# Patient Record
Sex: Female | Born: 1975
Health system: Southern US, Community
[De-identification: ages and names within clinical notes are randomized; demographics above are authoritative.]

## PROBLEM LIST (undated history)

## (undated) DIAGNOSIS — N979 Female infertility, unspecified: Secondary | ICD-10-CM

## (undated) DIAGNOSIS — N736 Female pelvic peritoneal adhesions (postinfective): Secondary | ICD-10-CM

## (undated) DIAGNOSIS — D649 Anemia, unspecified: Secondary | ICD-10-CM

## (undated) DIAGNOSIS — Z8614 Personal history of Methicillin resistant Staphylococcus aureus infection: Secondary | ICD-10-CM

## (undated) DIAGNOSIS — I10 Essential (primary) hypertension: Secondary | ICD-10-CM

## (undated) DIAGNOSIS — N920 Excessive and frequent menstruation with regular cycle: Secondary | ICD-10-CM

## (undated) DIAGNOSIS — E119 Type 2 diabetes mellitus without complications: Secondary | ICD-10-CM

## (undated) DIAGNOSIS — D259 Leiomyoma of uterus, unspecified: Secondary | ICD-10-CM

## (undated) DIAGNOSIS — K5909 Other constipation: Secondary | ICD-10-CM

## (undated) DIAGNOSIS — T7840XA Allergy, unspecified, initial encounter: Secondary | ICD-10-CM

## (undated) DIAGNOSIS — Z973 Presence of spectacles and contact lenses: Secondary | ICD-10-CM

## (undated) DIAGNOSIS — K5900 Constipation, unspecified: Secondary | ICD-10-CM

## (undated) HISTORY — DX: Anemia, unspecified: D64.9

## (undated) HISTORY — DX: Essential (primary) hypertension: I10

## (undated) HISTORY — DX: Female infertility, unspecified: N97.9

## (undated) HISTORY — PX: ROBOT ASSISTED MYOMECTOMY: SHX5142

## (undated) HISTORY — PX: ADENOIDECTOMY: SUR15

## (undated) HISTORY — PX: MYOMECTOMY: SHX85

## (undated) HISTORY — DX: Constipation, unspecified: K59.00

## (undated) HISTORY — DX: Type 2 diabetes mellitus without complications: E11.9

---

## 2007-11-17 ENCOUNTER — Emergency Department (HOSPITAL_COMMUNITY): Admission: EM | Admit: 2007-11-17 | Discharge: 2007-11-17 | Payer: Self-pay | Admitting: Family Medicine

## 2007-11-19 ENCOUNTER — Emergency Department (HOSPITAL_COMMUNITY): Admission: EM | Admit: 2007-11-19 | Discharge: 2007-11-19 | Payer: Self-pay | Admitting: Family Medicine

## 2007-11-21 ENCOUNTER — Emergency Department (HOSPITAL_COMMUNITY): Admission: EM | Admit: 2007-11-21 | Discharge: 2007-11-21 | Payer: Self-pay | Admitting: Family Medicine

## 2009-03-03 ENCOUNTER — Emergency Department (HOSPITAL_COMMUNITY): Admission: EM | Admit: 2009-03-03 | Discharge: 2009-03-03 | Payer: Self-pay | Admitting: Family Medicine

## 2009-06-22 ENCOUNTER — Inpatient Hospital Stay (HOSPITAL_COMMUNITY): Admission: AD | Admit: 2009-06-22 | Discharge: 2009-06-22 | Payer: Self-pay | Admitting: Obstetrics & Gynecology

## 2010-02-03 ENCOUNTER — Ambulatory Visit (HOSPITAL_COMMUNITY)
Admission: RE | Admit: 2010-02-03 | Discharge: 2010-02-06 | Payer: Self-pay | Source: Home / Self Care | Attending: Obstetrics & Gynecology | Admitting: Obstetrics & Gynecology

## 2010-02-03 ENCOUNTER — Encounter: Payer: Self-pay | Admitting: Obstetrics & Gynecology

## 2010-02-10 ENCOUNTER — Ambulatory Visit (HOSPITAL_COMMUNITY)
Admission: RE | Admit: 2010-02-10 | Discharge: 2010-02-10 | Payer: Self-pay | Source: Home / Self Care | Attending: Obstetrics & Gynecology | Admitting: Obstetrics & Gynecology

## 2010-05-01 LAB — CBC
MCHC: 31.3 g/dL (ref 30.0–36.0)
MCV: 70.6 fL — ABNORMAL LOW (ref 78.0–100.0)
MCV: 70.9 fL — ABNORMAL LOW (ref 78.0–100.0)
Platelets: 232 10*3/uL (ref 150–400)
RBC: 3.4 MIL/uL — ABNORMAL LOW (ref 3.87–5.11)
RDW: 16.2 % — ABNORMAL HIGH (ref 11.5–15.5)
RDW: 16.3 % — ABNORMAL HIGH (ref 11.5–15.5)

## 2010-05-01 LAB — BODY FLUID CULTURE: Culture: NO GROWTH

## 2010-05-01 LAB — TYPE AND SCREEN: Antibody Screen: NEGATIVE

## 2010-05-01 LAB — BASIC METABOLIC PANEL
BUN: 5 mg/dL — ABNORMAL LOW (ref 6–23)
BUN: 5 mg/dL — ABNORMAL LOW (ref 6–23)
CO2: 25 mEq/L (ref 19–32)
Calcium: 8.1 mg/dL — ABNORMAL LOW (ref 8.4–10.5)
Chloride: 105 mEq/L (ref 96–112)
Chloride: 107 mEq/L (ref 96–112)
Creatinine, Ser: 0.75 mg/dL (ref 0.4–1.2)
GFR calc Af Amer: >60 mL/min (ref >60)
GFR calc non Af Amer: >60 mL/min (ref >60)
Glucose, Bld: 134 mg/dL — ABNORMAL HIGH (ref 70–99)
Glucose, Bld: 165 mg/dL — ABNORMAL HIGH (ref 70–99)
Potassium: 3.5 mEq/L (ref 3.5–5.1)
Potassium: 3.7 mEq/L (ref 3.5–5.1)
Sodium: 135 mEq/L (ref 135–145)
Sodium: 139 mEq/L (ref 135–145)

## 2010-05-01 LAB — GLUCOSE, CAPILLARY

## 2010-05-01 LAB — ANAEROBIC CULTURE

## 2010-05-02 LAB — CBC
MCV: 71.3 fL — ABNORMAL LOW (ref 78.0–100.0)
WBC: 5.1 10*3/uL (ref 4.0–10.5)

## 2010-05-02 LAB — SURGICAL PCR SCREEN: Staphylococcus aureus: NEGATIVE

## 2010-05-07 LAB — DIFFERENTIAL
Basophils Absolute: 0 10*3/uL (ref 0.0–0.1)
Lymphocytes Relative: 21 % (ref 12–46)
Neutro Abs: 3.5 10*3/uL (ref 1.7–7.7)

## 2010-05-07 LAB — CBC
HCT: 29.4 % — ABNORMAL LOW (ref 36.0–46.0)
Hemoglobin: 9.2 g/dL — ABNORMAL LOW (ref 12.0–15.0)
MCHC: 31.3 g/dL (ref 30.0–36.0)
MCV: 68.7 fL — ABNORMAL LOW (ref 78.0–100.0)
Platelets: 334 10*3/uL (ref 150–400)
RBC: 4.28 MIL/uL (ref 3.87–5.11)
RDW: 17.5 % — ABNORMAL HIGH (ref 11.5–15.5)
WBC: 5.1 10*3/uL (ref 4.0–10.5)

## 2012-10-22 ENCOUNTER — Ambulatory Visit: Payer: Self-pay | Admitting: Family Medicine

## 2014-05-27 ENCOUNTER — Ambulatory Visit: Payer: BLUE CROSS/BLUE SHIELD | Admitting: Certified Nurse Midwife

## 2014-06-09 ENCOUNTER — Ambulatory Visit: Payer: BLUE CROSS/BLUE SHIELD | Admitting: Certified Nurse Midwife

## 2014-06-18 ENCOUNTER — Ambulatory Visit: Payer: Self-pay | Admitting: Certified Nurse Midwife

## 2014-06-30 ENCOUNTER — Encounter: Payer: Self-pay | Admitting: Certified Nurse Midwife

## 2014-06-30 ENCOUNTER — Ambulatory Visit (INDEPENDENT_AMBULATORY_CARE_PROVIDER_SITE_OTHER): Payer: BLUE CROSS/BLUE SHIELD | Admitting: Certified Nurse Midwife

## 2014-06-30 VITALS — BP 153/90 | HR 92 | Temp 98.2°F

## 2014-06-30 DIAGNOSIS — N760 Acute vaginitis: Secondary | ICD-10-CM

## 2014-06-30 DIAGNOSIS — E669 Obesity, unspecified: Secondary | ICD-10-CM | POA: Diagnosis not present

## 2014-06-30 DIAGNOSIS — Z01419 Encounter for gynecological examination (general) (routine) without abnormal findings: Secondary | ICD-10-CM

## 2014-06-30 DIAGNOSIS — B3731 Acute candidiasis of vulva and vagina: Secondary | ICD-10-CM

## 2014-06-30 DIAGNOSIS — B373 Candidiasis of vulva and vagina: Secondary | ICD-10-CM | POA: Diagnosis not present

## 2014-06-30 DIAGNOSIS — B9689 Other specified bacterial agents as the cause of diseases classified elsewhere: Secondary | ICD-10-CM

## 2014-06-30 DIAGNOSIS — A499 Bacterial infection, unspecified: Secondary | ICD-10-CM

## 2014-06-30 LAB — CBC
HCT: 34.6 % — ABNORMAL LOW (ref 36.0–46.0)
HEMOGLOBIN: 10 g/dL — AB (ref 12.0–15.0)
MCH: 20.6 pg — AB (ref 26.0–34.0)
MCHC: 28.9 g/dL — ABNORMAL LOW (ref 30.0–36.0)
MCV: 71.2 fL — AB (ref 78.0–100.0)
MPV: 9.8 fL (ref 8.6–12.4)
Platelets: 436 10*3/uL — ABNORMAL HIGH (ref 150–400)
RBC: 4.86 MIL/uL (ref 3.87–5.11)
RDW: 18.2 % — ABNORMAL HIGH (ref 11.5–15.5)
WBC: 8.6 10*3/uL (ref 4.0–10.5)

## 2014-06-30 MED ORDER — TINIDAZOLE 500 MG PO TABS: 2.0000 g | ORAL_TABLET | Freq: Every day | ORAL | Status: AC

## 2014-06-30 MED ORDER — FLUCONAZOLE 100 MG PO TABS: 100.0000 mg | ORAL_TABLET | Freq: Every day | ORAL | Status: AC

## 2014-06-30 NOTE — Addendum Note (Signed)
Addended by: Carole Binning on: 06/30/2014 05:12 PM   Modules accepted: Orders

## 2014-06-30 NOTE — Progress Notes (Signed)
Patient ID: [name], female   DOB: [birth year], 39 y.o.   MRN: [id number]    Subjective:     Christine Best is a 39 y.o. female here for a routine exam.  Current complaints: has had fibroids, has vaginal discharge with fishy odor.  She is wondering if she also has a yeast infection, has been on antibiotics recently for BV.    Employed.  Periods are heavy but not bothersome.  Graduating next weeks as a nurse-practitoner.  Going on cruse next week.    Personal health questionnaire:  Is patient Ashkenazi Jewish, have a family history of breast and/or ovarian cancer: no Is there a family history of uterine cancer diagnosed at age < 60, gastrointestinal cancer, urinary tract cancer, family member who is a Field seismologist syndrome-associated carrier: no Is the patient overweight and hypertensive, family history of diabetes, personal history of gestational diabetes, preeclampsia or PCOS: yes Is patient over 74, have PCOS,  family history of premature CHD under age 43, diabetes, smoke, have hypertension or peripheral artery disease:  no At any time, has a partner hit, kicked or otherwise hurt or frightened you?: no Over the past 2 weeks, have you felt down, depressed or hopeless?: no Over the past 2 weeks, have you felt little interest or pleasure in doing things?:no   Gynecologic History Patient's last menstrual period was 06/09/2014. Contraception: none Last Pap: unknown. Results were: normal according to the patient Last mammogram: N/A.  Obstetric History OB History  Gravida Para Term Preterm AB SAB TAB Ectopic Multiple Living  0 0 0 0 0 0 0 0 0 0         History reviewed. No pertinent past medical history.  Past Surgical History  Procedure Laterality Date  . Myomectomy       Current outpatient prescriptions:  .  medroxyPROGESTERone (PROVERA) 10 MG tablet, Take 10 mg by mouth daily., Disp: , Rfl:  .  fluconazole (DIFLUCAN) 100 MG tablet, Take 1 tablet (100 mg total) by mouth daily., Disp:  30 tablet, Rfl: 0 .  tinidazole (TINDAMAX) 500 MG tablet, Take 4 tablets (2,000 mg total) by mouth daily with breakfast., Disp: 12 tablet, Rfl: 0 Not on File  History  Substance Use Topics  . Smoking status: Never Smoker   . Smokeless tobacco: Not on file  . Alcohol Use: 0.0 oz/week    0 Standard drinks or equivalent per week     Comment: occasional    History reviewed. No pertinent family history.    Review of Systems  Constitutional: negative for fatigue and weight loss Respiratory: negative for cough and wheezing Cardiovascular: negative for chest pain, fatigue and palpitations Gastrointestinal: negative for abdominal pain and change in bowel habits Musculoskeletal:negative for myalgias Neurological: negative for gait problems and tremors Behavioral/Psych: negative for abusive relationship, depression Endocrine: negative for temperature intolerance   Genitourinary:negative for abnormal menstrual periods, genital lesions, hot flashes, sexual problems and vaginal discharge Integument/breast: negative for breast lump, breast tenderness, nipple discharge and skin lesion(s)    Objective:       BP 153/90 mmHg  Pulse 92  Temp(Src) 98.2 F (36.8 C)  LMP 06/09/2014 General:   alert  Skin:   no rash or abnormalities  Lungs:   clear to auscultation bilaterally  Heart:   regular rate and rhythm, S1, S2 normal, no murmur, click, rub or gallop  Breasts:   normal without suspicious masses, skin or nipple changes or axillary nodes  Abdomen:  normal findings: no organomegaly,  soft, non-tender and no hernia, obese  Pelvis:  External genitalia: normal general appearance Urinary system: urethral meatus normal and bladder without fullness, nontender Vaginal: normal without tenderness, induration or masses, + gray thin vaginal discharge, + odor, +white chunky discharge Cervix: normal appearance Adnexa: normal bimanual exam Uterus: anteverted and non-tender, normal size   Lab  Review Urine pregnancy test Labs reviewed yes Radiologic studies reviewed no  50% of 30 min visit spent on counseling and coordination of care.   Assessment:    Healthy female exam.   BV Obesity affecting fertility Vulvovaginal candidasis   Plan:    Education reviewed: depression evaluation, low fat, low cholesterol diet, safe sex/STD prevention, self breast exams, skin cancer screening and weight bearing exercise. Contraception: none. Follow up in: 6 months.   Meds ordered this encounter  Medications  . medroxyPROGESTERone (PROVERA) 10 MG tablet    Sig: Take 10 mg by mouth daily.  [name] tinidazole (TINDAMAX) 500 MG tablet    Sig: Take 4 tablets (2,000 mg total) by mouth daily with breakfast.    Dispense:  12 tablet    Refill:  0  . fluconazole (DIFLUCAN) 100 MG tablet    Sig: Take 1 tablet (100 mg total) by mouth daily.    Dispense:  30 tablet    Refill:  0   Orders Placed This Encounter  Procedures  . HIV antibody (with reflex)  . Hepatitis B surface antigen  . RPR  . Hepatitis C antibody  . TSH  . Prolactin  . CBC  . Comprehensive metabolic panel  . Hemoglobin A1c  . Referral to Nutrition and Diabetes Services    Referral Priority:  Routine    Referral Type:  Consultation    Referral Reason:  Specialty Services Required    Number of Visits Requested:  1

## 2014-07-01 ENCOUNTER — Telehealth: Payer: Self-pay | Admitting: *Deleted

## 2014-07-01 LAB — COMPREHENSIVE METABOLIC PANEL
ALK PHOS: 77 U/L (ref 39–117)
ALT: 11 U/L (ref 0–35)
AST: 13 U/L (ref 0–37)
Albumin: 4 g/dL (ref 3.5–5.2)
BILIRUBIN TOTAL: 0.3 mg/dL (ref 0.2–1.2)
BUN: 9 mg/dL (ref 6–23)
CO2: 22 mEq/L (ref 19–32)
Calcium: 8.9 mg/dL (ref 8.4–10.5)
Chloride: 104 mEq/L (ref 96–112)
Creat: 0.85 mg/dL (ref 0.50–1.10)
Glucose, Bld: 138 mg/dL — ABNORMAL HIGH (ref 70–99)
Potassium: 3.8 mEq/L (ref 3.5–5.3)
SODIUM: 136 meq/L (ref 135–145)
TOTAL PROTEIN: 7 g/dL (ref 6.0–8.3)

## 2014-07-01 LAB — HEMOGLOBIN A1C
HEMOGLOBIN A1C: 7.1 % — AB (ref <5.7)
Mean Plasma Glucose: 157 mg/dL — ABNORMAL HIGH (ref <117)

## 2014-07-01 LAB — PROLACTIN: Prolactin: 24.2 ng/mL

## 2014-07-01 LAB — HIV ANTIBODY (ROUTINE TESTING W REFLEX): HIV: NONREACTIVE

## 2014-07-01 LAB — TSH: TSH: 0.956 u[IU]/mL (ref 0.350–4.500)

## 2014-07-01 LAB — RPR

## 2014-07-01 LAB — HEPATITIS C ANTIBODY: HCV Ab: NEGATIVE

## 2014-07-01 LAB — HEPATITIS B SURFACE ANTIGEN: HEP B S AG: NEGATIVE

## 2014-07-01 NOTE — Telephone Encounter (Signed)
Patient called with questions about the diflucan Rx- did Rachelle mean to write for 30 tablets and at 100 mg?

## 2014-07-02 LAB — PAP IG AND HPV HIGH-RISK: HPV DNA HIGH RISK: NOT DETECTED

## 2014-07-03 LAB — SURESWAB, VAGINOSIS/VAGINITIS PLUS
Atopobium vaginae: 6.8 Log (cells/mL)
C. TROPICALIS, DNA: NOT DETECTED
C. albicans, DNA: NOT DETECTED
C. glabrata, DNA: NOT DETECTED
C. parapsilosis, DNA: NOT DETECTED
C. trachomatis RNA, TMA: NOT DETECTED
LACTOBACILLUS SPECIES: NOT DETECTED Log (cells/mL)
MEGASPHAERA SPECIES: >8 Log (cells/mL)
N. GONORRHOEAE RNA, TMA: NOT DETECTED
T. VAGINALIS RNA, QL TMA: NOT DETECTED

## 2014-07-06 ENCOUNTER — Other Ambulatory Visit: Payer: Self-pay | Admitting: Certified Nurse Midwife

## 2014-07-06 ENCOUNTER — Other Ambulatory Visit: Payer: Self-pay | Admitting: *Deleted

## 2014-07-06 NOTE — Telephone Encounter (Signed)
No I meant to give her 1 with repeat in 48-72 hours.  Thank you.

## 2014-07-07 ENCOUNTER — Encounter: Payer: Self-pay | Admitting: *Deleted

## 2014-07-07 ENCOUNTER — Encounter: Payer: Self-pay | Admitting: Certified Nurse Midwife

## 2014-07-07 ENCOUNTER — Telehealth: Payer: Self-pay | Admitting: Certified Nurse Midwife

## 2014-07-07 NOTE — Telephone Encounter (Addendum)
Spoke with patient regarding her lab results.  Patient was upset and did not want to discuss them.  Patient told that her test results would be sent in the mail to her home.  Asked if she had an appointment scheduled with her primary care provider.  Patient stated that she did not.  When asked if she would like to have her test results faxed to her primary care provider she declined.  She stated I was not following the guidelines, when asked what guidelines she then hung up the phone.    Kandis Cocking, CNM

## 2014-07-08 NOTE — Telephone Encounter (Signed)
Rachelle called patient to review labs and Rx default problem.

## 2014-07-16 ENCOUNTER — Telehealth: Payer: Self-pay | Admitting: *Deleted

## 2014-07-20 NOTE — Telephone Encounter (Signed)
error 

## 2016-02-24 DIAGNOSIS — N939 Abnormal uterine and vaginal bleeding, unspecified: Secondary | ICD-10-CM | POA: Diagnosis not present

## 2016-02-24 DIAGNOSIS — D251 Intramural leiomyoma of uterus: Secondary | ICD-10-CM | POA: Diagnosis not present

## 2016-02-24 DIAGNOSIS — Z01411 Encounter for gynecological examination (general) (routine) with abnormal findings: Secondary | ICD-10-CM | POA: Diagnosis not present

## 2016-02-28 MED FILL — metroNIDAZOLE 500 MG TABS: 500 | 7 days supply | Qty: 14 | Fill #0

## 2016-08-01 ENCOUNTER — Ambulatory Visit: Payer: BLUE CROSS/BLUE SHIELD | Admitting: Family Medicine

## 2016-08-02 ENCOUNTER — Encounter: Payer: Self-pay | Admitting: Family Medicine

## 2016-08-02 ENCOUNTER — Ambulatory Visit (INDEPENDENT_AMBULATORY_CARE_PROVIDER_SITE_OTHER): Payer: 59 | Admitting: Family Medicine

## 2016-08-02 DIAGNOSIS — Z23 Encounter for immunization: Secondary | ICD-10-CM

## 2016-08-02 DIAGNOSIS — E119 Type 2 diabetes mellitus without complications: Secondary | ICD-10-CM | POA: Diagnosis not present

## 2016-08-02 LAB — POCT GLYCOSYLATED HEMOGLOBIN (HGB A1C): Hemoglobin A1C: 6.9

## 2016-08-02 MED ORDER — LISINOPRIL 5 MG PO TABS: 5.0000 mg | ORAL_TABLET | Freq: Every day | ORAL | 1 refills | Status: DC

## 2016-08-02 MED ORDER — METFORMIN HCL ER 500 MG PO TB24: 500.0000 mg | ORAL_TABLET | Freq: Every day | ORAL | 0 refills | Status: DC

## 2016-08-02 MED FILL — LISINOPRIL 5 MG TABLET: 5 | 30 days supply | Qty: 30 | Fill #0

## 2016-08-02 MED FILL — METFORMIN HCL ER 500 MG TAB: 500 | 90 days supply | Qty: 90 | Fill #0

## 2016-08-02 NOTE — Progress Notes (Signed)
Chief Complaint  Patient presents with  . Establish Care    per patient, A1C has been high; states last reading was appx 6.7    HPI  Diabetes Mellitus: Patient presents for follow up of diabetes. Symptoms: none.  Patient denies hyperglycemia, hypoglycemia , nausea, paresthesia of the feet, polydipsia and polyuria.  Evaluation to date has been included: hemoglobin A1C.  Home sugars: patient does not check sugars. Treatment to date: none.  She has a smart watch to track steps and standing. Lab Results  Component Value Date   HGBA1C 6.9 08/02/2016   Morbid Obesity Pt reports that she is not currently doing any active exercise. She works a busy schedule. She reports that she is sedentary. She works 6 days a week for 10-12 hours a day Wt Readings from Last 3 Encounters:  08/02/16 272 lb (123.4 kg)   Body mass index is 49.35 kg/m.  Prehypertension BP Readings from Last 3 Encounters:  08/02/16 128/90  06/30/14 (!) 153/90   Pt reports that she was never diagnosed with hypertension but that her diastolic bp was in the 46K She does not stick to a DASH diet or exercise She reports that her previous gyne checked labs and she had no kidney disease   No past medical history on file.  Current Outpatient Prescriptions  Medication Sig Dispense Refill  . lisinopril (PRINIVIL,ZESTRIL) 5 MG tablet Take 1 tablet (5 mg total) by mouth daily. 30 tablet 1  . medroxyPROGESTERone (PROVERA) 10 MG tablet Take 10 mg by mouth daily.    . metFORMIN (GLUCOPHAGE-XR) 500 MG 24 hr tablet Take 1 tablet (500 mg total) by mouth daily with breakfast. 90 tablet 0   No current facility-administered medications for this visit.     Allergies: No Known Allergies  Past Surgical History:  Procedure Laterality Date  . MYOMECTOMY      Social History   Social History  . Marital status: Single    Spouse name: N/A  . Number of children: N/A  . Years of education: N/A   Social History Main Topics  .  Smoking status: Never Smoker  . Smokeless tobacco: Never Used  . Alcohol use 0.0 oz/week     Comment: occasional  . Drug use: No  . Sexual activity: Yes    Birth control/ protection: None   Other Topics Concern  . None   Social History Narrative  . None    ROS See hpi  Objective: Vitals:   08/02/16 0842  BP: 128/90  Pulse: 95  Resp: 16  Temp: 98.2 F (36.8 C)  TempSrc: Oral  SpO2: 99%  Weight: 272 lb (123.4 kg)  Height: 5' 2.25" (1.581 m)   Diabetic Foot Exam - Simple   Simple Foot Form Diabetic Foot exam was performed with the following findings:  Yes 08/02/2016  9:43 AM  Visual Inspection No deformities, no ulcerations, no other skin breakdown bilaterally:  Yes Sensation Testing Intact to touch and monofilament testing bilaterally:  Yes Pulse Check Posterior Tibialis and Dorsalis pulse intact bilaterally:  Yes Comments     Physical Exam  Constitutional: She is oriented to person, place, and time. She appears well-developed and well-nourished.  HENT:  Head: Normocephalic and atraumatic.  Eyes: Conjunctivae and EOM are normal.  Cardiovascular: Normal rate, regular rhythm and normal heart sounds.   Pulmonary/Chest: Effort normal and breath sounds normal. No respiratory distress. She has no wheezes.  Neurological: She is alert and oriented to person, place, and time.  Skin: Skin  is warm. Capillary refill takes less than 2 seconds. No erythema.     Assessment and Plan Christine Best was seen today for establish care.  Diagnoses and all orders for this visit:  Morbid obesity ([place])- advised pt to try a weight loss program with 7% weight loss  Need for Tdap vaccination  Need for vaccination  Type 2 diabetes, diet controlled ([place]) -     POCT glycosylated hemoglobin (Hb A1C) Added metformin  Lisinopril for renal protection and prehypertension Discussed risks of ace inhibitor such as renal insuff, hyperK, cough -     metFORMIN (GLUCOPHAGE-XR) 500 MG 24 hr tablet;  Take 1 tablet (500 mg total) by mouth daily with breakfast. -     Tdap vaccine greater than or equal to 7yo IM -     Pneumococcal polysaccharide vaccine 23-valent greater than or equal to 2yo subcutaneous/IM -     lisinopril (PRINIVIL,ZESTRIL) 5 MG tablet; Take 1 tablet (5 mg total) by mouth daily.  Prehypertension- started lisinopril  Tremon Sainvil A Aariyana Manz

## 2016-08-02 NOTE — Patient Instructions (Addendum)
   IF you received an x-ray today, you will receive an invoice from [facility]. Please contact [facility] at [phone] with questions or concerns regarding your invoice.   IF you received labwork today, you will receive an invoice from [facility]. Please contact [facility] at [phone] with questions or concerns regarding your invoice.   Our billing staff will not be able to assist you with questions regarding bills from these companies.  You will be contacted with the lab results as soon as they are available. The fastest way to get your results is to activate your My Chart account. Instructions are located on the last page of this paperwork. If you have not heard from us regarding the results in 2 weeks, please contact this office.      Diabetes Mellitus and Standards of Medical Care Managing diabetes (diabetes mellitus) can be complicated. Your diabetes treatment may be managed by a team of health care providers, including:  A diet and nutrition specialist (registered dietitian).  A nurse.  A certified diabetes educator (CDE).  A diabetes specialist (endocrinologist).  An eye doctor.  A primary care provider.  A dentist.  Your health care providers follow a schedule in order to help you get the best quality of care. The following schedule is a general guideline for your diabetes management plan. Your health care providers may also give you more specific instructions. HbA1c ( hemoglobin A1c) test This test provides information about blood sugar (glucose) control over the previous 2-3 months. It is used to check whether your diabetes management plan needs to be adjusted.  If you are meeting your treatment goals, this test is done at least 2 times a year.  If you are not meeting treatment goals or if your treatment goals have changed, this test is done 4 times a year.  Blood pressure test  This test is done at every routine medical visit. For most  people, the goal is less than 130/80. Ask your health care provider what your goal blood pressure should be. Dental and eye exams  Visit your dentist two times a year.  If you have type 1 diabetes, get an eye exam 3-5 years after you are diagnosed, and then once a year after your first exam. ? If you were diagnosed with type 1 diabetes as a child, get an eye exam when you are age 10 or older and have had diabetes for 3-5 years. After the first exam, you should get an eye exam once a year.  If you have type 2 diabetes, have an eye exam as soon as you are diagnosed, and then once a year after your first exam. Foot care exam  Visual foot exams are done at every routine medical visit. The exams check for cuts, bruises, redness, blisters, sores, or other problems with the feet.  A complete foot exam is done by your health care provider once a year. This exam includes an inspection of the structure and skin of your feet, and a check of the pulses and sensation in your feet. ? Type 1 diabetes: Get your first exam 3-5 years after diagnosis. ? Type 2 diabetes: Get your first exam as soon as you are diagnosed.  Check your feet every day for cuts, bruises, redness, blisters, or sores. If you have any of these or other problems that are not healing, contact your health care provider. Kidney function test ( urine microalbumin)  This test is done once a year. ? Type 1 diabetes:   Get your first test 5 years after diagnosis. ? Type 2 diabetes: Get your first test as soon as you are diagnosed.  If you have chronic kidney disease (CKD), get a serum creatinine and estimated glomerular filtration rate (eGFR) test once a year. Lipid profile (cholesterol, HDL, LDL, triglycerides)  This test should be done when you are diagnosed with diabetes, and every 5 years after the first test. If you are on medicines to lower your cholesterol, you may need to get this test done every year. ? The goal for LDL is less than  100 mg/dL (5.5 mmol/L). If you are at high risk, the goal is less than 70 mg/dL (3.9 mmol/L). ? The goal for HDL is 40 mg/dL (2.2 mmol/L) for men and 50 mg/dL(2.8 mmol/L) for women. An HDL cholesterol of 60 mg/dL (3.3 mmol/L) or higher gives some protection against heart disease. ? The goal for triglycerides is less than 150 mg/dL (8.3 mmol/L). Immunizations  The yearly flu (influenza) vaccine is recommended for everyone 6 months or older who has diabetes.  The pneumonia (pneumococcal) vaccine is recommended for everyone 2 years or older who has diabetes. If you are 65 or older, you may get the pneumonia vaccine as a series of two separate shots.  The hepatitis B vaccine is recommended for adults shortly after they have been diagnosed with diabetes.  The Tdap (tetanus, diphtheria, and pertussis) vaccine should be given: ? According to normal childhood vaccination schedules, for children. ? Every 10 years, for adults who have diabetes.  The shingles vaccine is recommended for people who have had chicken pox and are 50 years or older. Mental and emotional health  Screening for symptoms of eating disorders, anxiety, and depression is recommended at the time of diagnosis and afterward as needed. If your screening shows that you have symptoms (you have a positive screening result), you may need further evaluation and be referred to a mental health care provider. Diabetes self-management education  Education about how to manage your diabetes is recommended at diagnosis and ongoing as needed. Treatment plan  Your treatment plan will be reviewed at every medical visit. Summary  Managing diabetes (diabetes mellitus) can be complicated. Your diabetes treatment may be managed by a team of health care providers.  Your health care providers follow a schedule in order to help you get the best quality of care.  Standards of care including having regular physical exams, blood tests, blood pressure  monitoring, immunizations, screening tests, and education about how to manage your diabetes.  Your health care providers may also give you more specific instructions based on your individual health. This information is not intended to replace advice given to you by your health care provider. Make sure you discuss any questions you have with your health care provider. Document Released: 12/03/2008 Document Revised: 11/04/2015 Document Reviewed: 11/04/2015 Elsevier Interactive Patient Education  2018 Elsevier Inc.  

## 2016-08-17 ENCOUNTER — Encounter: Payer: Self-pay | Admitting: Family Medicine

## 2016-09-05 MED FILL — LISINOPRIL 5 MG TABLET: 5 | 30 days supply | Qty: 30 | Fill #1

## 2016-09-18 ENCOUNTER — Encounter: Payer: Self-pay | Admitting: Gastroenterology

## 2016-10-09 ENCOUNTER — Encounter: Payer: Self-pay | Admitting: Family Medicine

## 2016-10-09 ENCOUNTER — Ambulatory Visit: Payer: 59 | Admitting: Family Medicine

## 2016-10-09 ENCOUNTER — Ambulatory Visit (INDEPENDENT_AMBULATORY_CARE_PROVIDER_SITE_OTHER): Payer: 59 | Admitting: Family Medicine

## 2016-10-09 DIAGNOSIS — E119 Type 2 diabetes mellitus without complications: Secondary | ICD-10-CM

## 2016-10-09 DIAGNOSIS — I10 Essential (primary) hypertension: Secondary | ICD-10-CM

## 2016-10-09 MED ORDER — METFORMIN HCL ER 500 MG PO TB24: 500.0000 mg | ORAL_TABLET | Freq: Every day | ORAL | 1 refills | Status: DC

## 2016-10-09 MED ORDER — LISINOPRIL 10 MG PO TABS: 10.0000 mg | ORAL_TABLET | Freq: Every day | ORAL | 1 refills | Status: DC

## 2016-10-09 MED FILL — LISINOPRIL 10 MG TABLET: 10 | 90 days supply | Qty: 90 | Fill #0

## 2016-10-09 NOTE — Progress Notes (Deleted)
  No chief complaint on file.   HPI  No past medical history on file.  Current Outpatient Prescriptions  Medication Sig Dispense Refill  . lisinopril (PRINIVIL,ZESTRIL) 5 MG tablet Take 1 tablet (5 mg total) by mouth daily. 30 tablet 1  . medroxyPROGESTERone (PROVERA) 10 MG tablet Take 10 mg by mouth daily.    . metFORMIN (GLUCOPHAGE-XR) 500 MG 24 hr tablet Take 1 tablet (500 mg total) by mouth daily with breakfast. 90 tablet 0   No current facility-administered medications for this visit.     Allergies: No Known Allergies  Past Surgical History:  Procedure Laterality Date  . MYOMECTOMY      Social History   Social History  . Marital status: Single    Spouse name: N/A  . Number of children: N/A  . Years of education: N/A   Social History Main Topics  . Smoking status: Never Smoker  . Smokeless tobacco: Never Used  . Alcohol use 0.0 oz/week     Comment: occasional  . Drug use: No  . Sexual activity: Yes    Birth control/ protection: None   Other Topics Concern  . Not on file   Social History Narrative  . No narrative on file    ROS  Objective: There were no vitals filed for this visit.  Physical Exam  Assessment and Plan There are no diagnoses linked to this encounter.   Haedyn Breau P [employer]

## 2016-10-09 NOTE — Patient Instructions (Addendum)
     IF you received an x-ray today, you will receive an invoice from [facility]. Please contact [facility] at [phone] with questions or concerns regarding your invoice.   IF you received labwork today, you will receive an invoice from [facility]. Please contact [facility] at [phone] with questions or concerns regarding your invoice.   Our billing staff will not be able to assist you with questions regarding bills from these companies.  You will be contacted with the lab results as soon as they are available. The fastest way to get your results is to activate your My Chart account. Instructions are located on the last page of this paperwork. If you have not heard from us regarding the results in 2 weeks, please contact this office.     

## 2016-10-09 NOTE — Progress Notes (Signed)
Chief Complaint  Patient presents with  . Hypertension    check  . Diabetes    check    HPI  Diabetes Mellitus: Patient presents for follow up of diabetes. Symptoms: none. Symptoms have stabilized. Patient denies hyperglycemia, hypoglycemia , nausea, paresthesia of the feet, polydipsia and polyuria.  Evaluation to date has been included: hemoglobin A1C.  Home sugars: patient does not check sugars.  Lab Results  Component Value Date   HGBA1C 6.5 (H) 10/09/2016   She reports that she is not exercising but is consistent with the metformin  She is interested in [employer] Readings from Last 3 Encounters:  10/09/16 265 lb 6.4 oz (120.4 kg)  08/02/16 272 lb (123.4 kg)    No past medical history on file.  Current Outpatient Medications  Medication Sig Dispense Refill  . lisinopril (PRINIVIL,ZESTRIL) 10 MG tablet Take 1 tablet (10 mg total) by mouth daily. 90 tablet 1  . metFORMIN (GLUCOPHAGE-XR) 500 MG 24 hr tablet Take 1 tablet (500 mg total) by mouth daily with breakfast. 90 tablet 1  . medroxyPROGESTERone (PROVERA) 10 MG tablet Take 10 mg by mouth daily.     No current facility-administered medications for this visit.     Allergies: No Known Allergies  Past Surgical History:  Procedure Laterality Date  . MYOMECTOMY      Social History   Socioeconomic History  . Marital status: Single    Spouse name: None  . Number of children: None  . Years of education: None  . Highest education level: None  Social Needs  . Financial resource strain: None  . Food insecurity - worry: None  . Food insecurity - inability: None  . Transportation needs - medical: None  . Transportation needs - non-medical: None  Occupational History  . None  Tobacco Use  . Smoking status: Never Smoker  . Smokeless tobacco: Never Used  Substance and Sexual Activity  . Alcohol use: Yes    Alcohol/week: 0.0 oz    Comment: occasional  . Drug use: No  . Sexual activity: Yes    Birth  control/protection: None  Other Topics Concern  . None  Social History Narrative  . None   Family History  Problem Relation Age of Onset  . Cancer Neg Hx     ROS  Review of Systems See HPI Constitution: No fevers or chills No malaise No diaphoresis Skin: No rash or itching Eyes: no blurry vision, no double vision GU: no dysuria or hematuria Neuro: no dizziness or headaches    Objective: Vitals:   10/09/16 1447 10/09/16 1507  BP: (!) 164/102 140/88  Pulse: 87 79  Resp: 16   Temp: 97.8 F (36.6 C)   TempSrc: Oral   SpO2: 97%   Weight: 265 lb 6.4 oz (120.4 kg)   Height: 5' 2.25" (1.581 m)     Physical Exam  Constitutional: She is oriented to person, place, and time. She appears well-developed and well-nourished.  Cardiovascular: Normal rate, regular rhythm and normal heart sounds.  Pulmonary/Chest: Breath sounds normal. No stridor. No respiratory distress.  Neurological: She is alert and oriented to person, place, and time.    Assessment and Plan Bridgit was seen today for hypertension and diabetes.  Diagnoses and all orders for this visit:  Morbid obesity ([place]) -     Lipid panel -     Comprehensive metabolic panel -     Hemoglobin A1c  Well controlled diabetes mellitus ([place]) -     Lipid  panel -     Comprehensive metabolic panel -     Hemoglobin A1c  Other orders -     Cancel: POCT glycosylated hemoglobin (Hb A1C) -     metFORMIN (GLUCOPHAGE-XR) 500 MG 24 hr tablet; Take 1 tablet (500 mg total) by mouth daily with breakfast. -     lisinopril (PRINIVIL,ZESTRIL) 10 MG tablet; Take 1 tablet (10 mg total) by mouth daily.  Essential Hypertension BP elevated Will treat for hypertension with lisinopril  Continue current doses of medicaitons   Darric Plante A [employer]

## 2016-10-10 LAB — HEMOGLOBIN A1C
Est. average glucose Bld gHb Est-mCnc: 140 mg/dL
HEMOGLOBIN A1C: 6.5 % — AB (ref 4.8–5.6)

## 2016-10-10 LAB — COMPREHENSIVE METABOLIC PANEL
ALK PHOS: 84 IU/L (ref 39–117)
ALT: 10 IU/L (ref 0–32)
AST: 13 IU/L (ref 0–40)
Albumin/Globulin Ratio: 1.5 (ref 1.2–2.2)
Albumin: 4.4 g/dL (ref 3.5–5.5)
BILIRUBIN TOTAL: 0.4 mg/dL (ref 0.0–1.2)
BUN/Creatinine Ratio: 10 (ref 9–23)
BUN: 7 mg/dL (ref 6–20)
CHLORIDE: 103 mmol/L (ref 96–106)
CO2: 21 mmol/L (ref 20–29)
Calcium: 9.4 mg/dL (ref 8.7–10.2)
Creatinine, Ser: 0.68 mg/dL (ref 0.57–1.00)
GFR calc Af Amer: 128 mL/min/{1.73_m2} (ref >59)
GFR calc non Af Amer: 111 mL/min/{1.73_m2} (ref >59)
GLUCOSE: 83 mg/dL (ref 65–99)
Globulin, Total: 2.9 g/dL (ref 1.5–4.5)
Potassium: 4.2 mmol/L (ref 3.5–5.2)
Sodium: 141 mmol/L (ref 134–144)
Total Protein: 7.3 g/dL (ref 6.0–8.5)

## 2016-10-10 LAB — LIPID PANEL
CHOL/HDL RATIO: 3.1 ratio (ref 0.0–4.4)
Cholesterol, Total: 175 mg/dL (ref 100–199)
HDL: 56 mg/dL (ref >39)
LDL CALC: 107 mg/dL — AB (ref 0–99)
Triglycerides: 62 mg/dL (ref 0–149)
VLDL CHOLESTEROL CAL: 12 mg/dL (ref 5–40)

## 2016-10-24 MED FILL — METFORMIN HCL ER 500 MG TAB: 500 | 90 days supply | Qty: 90 | Fill #0

## 2016-11-12 ENCOUNTER — Ambulatory Visit: Payer: Self-pay | Admitting: Gastroenterology

## 2016-12-28 ENCOUNTER — Encounter: Payer: Self-pay | Admitting: Family Medicine

## 2017-01-11 NOTE — Progress Notes (Signed)
Chief Complaint  Patient presents with  . Diabetes  . Follow-up    3 month follow up  . Medication Refill    Lisinopril 10 MG    HPI  Diabetes Mellitus: Patient presents for follow up of diabetes. Symptoms: polyuria. Symptoms have stabilized. Patient denies hyperglycemia, hypoglycemia , increase appetite, nausea and paresthesia of the feet.  Evaluation to date has been included: hemoglobin A1C.  Home sugars: BGs consistently in an acceptable range typically around 130 fasting. Treatment to date: Increased dose of metformin which has been somewhat effective.  Lab Results  Component Value Date   HGBA1C 7 01/14/2017   Lab Results  Component Value Date   HGBA1C 7 01/14/2017     Hypertension: Patient here for follow-up of elevated blood pressure. She is not exercising and is not adherent to low salt diet.  Blood pressure is well controlled at home.  She eats out all the time.  She states that she grabs a sausage biscuit from [employer].  Cardiac symptoms none. Patient denies chest pain, dyspnea, fatigue, irregular heart beat and palpitations.  Cardiovascular risk factors: diabetes mellitus, hypertension and obesity (BMI >= 30 kg/m2). Use of agents associated with hypertension: none. History of target organ damage: none. BP Readings from Last 3 Encounters:  01/14/17 120/86  10/09/16 140/88  08/02/16 128/90   Morbid Obesity Pt reports that she is watching what she is eating She is not exercising more She is also drinking more water She reports that that she does not feel like her clothes feel any different Body mass index is 47.65 kg/m. Wt Readings from Last 3 Encounters:  01/14/17 262 lb 9.6 oz (119.1 kg)  10/09/16 265 lb 6.4 oz (120.4 kg)  08/02/16 272 lb (123.4 kg)        History reviewed. No pertinent past medical history.  Current Outpatient Medications  Medication Sig Dispense Refill  . lisinopril (PRINIVIL,ZESTRIL) 10 MG tablet Take 1 tablet (10 mg total) by mouth  daily. 90 tablet 1  . metFORMIN (GLUCOPHAGE-XR) 750 MG 24 hr tablet Take 1 tablet (750 mg total) by mouth daily with breakfast. 90 tablet 1  . medroxyPROGESTERone (PROVERA) 10 MG tablet Take 10 mg by mouth daily.     No current facility-administered medications for this visit.     Allergies: No Known Allergies  Past Surgical History:  Procedure Laterality Date  . MYOMECTOMY      Social History   Socioeconomic History  . Marital status: Single    Spouse name: None  . Number of children: None  . Years of education: None  . Highest education level: None  Social Needs  . Financial resource strain: None  . Food insecurity - worry: None  . Food insecurity - inability: None  . Transportation needs - medical: None  . Transportation needs - non-medical: None  Occupational History  . None  Tobacco Use  . Smoking status: Never Smoker  . Smokeless tobacco: Never Used  Substance and Sexual Activity  . Alcohol use: Yes    Alcohol/week: 0.0 oz    Comment: occasional  . Drug use: No  . Sexual activity: Yes    Birth control/protection: None  Other Topics Concern  . None  Social History Narrative  . None    Family History  Problem Relation Age of Onset  . Cancer Neg Hx      ROS Review of Systems See HPI Constitution: No fevers or chills No malaise No diaphoresis Skin: No rash or itching Eyes:  no blurry vision, no double vision GU: no dysuria or hematuria Neuro: no dizziness or headaches all others reviewed and negative   Objective: Vitals:   01/14/17 0821  BP: 120/86  Pulse: 99  Resp: 18  Temp: 98.4 F (36.9 C)  TempSrc: Oral  SpO2: 100%  Weight: 262 lb 9.6 oz (119.1 kg)  Height: 5' 2.25" (1.581 m)    Physical Exam  Constitutional: She is oriented to person, place, and time. She appears well-developed and well-nourished.  HENT:  Head: Normocephalic and atraumatic.  Eyes: Conjunctivae and EOM are normal.  Neck: Normal range of motion. No thyromegaly  present.  Cardiovascular: Normal rate, regular rhythm and normal heart sounds.  No murmur heard. Pulmonary/Chest: Effort normal and breath sounds normal. No stridor. No respiratory distress.  Abdominal: Soft. Bowel sounds are normal. She exhibits no distension. There is no tenderness. There is no guarding.  Neurological: She is alert and oriented to person, place, and time.  Psychiatric: She has a normal mood and affect. Her behavior is normal. Judgment and thought content normal.    Assessment and Plan Preslie was seen today for diabetes, follow-up and medication refill.  Diagnoses and all orders for this visit:  Morbid obesity ([place])- weight unchanged -     Basic metabolic panel  Well controlled diabetes mellitus ([place])- worsening but still a1c of 7% -     POCT glycosylated hemoglobin (Hb A1C) -     Basic metabolic panel -     metFORMIN (GLUCOPHAGE-XR) 750 MG 24 hr tablet; Take 1 tablet (750 mg total) by mouth daily with breakfast.  Type 2 diabetes, diet controlled ([place])- no longer diet controlled. She is now on metformin ER -     POCT glycosylated hemoglobin (Hb A1C) -     Basic metabolic panel  Essential hypertension- improved on lisinopril -     Basic metabolic panel  Other orders -     Cancel: Flu Vaccine QUAD 36+ mos IM     Elianah Karis A Katanya Schlie

## 2017-01-14 ENCOUNTER — Other Ambulatory Visit: Payer: Self-pay

## 2017-01-14 ENCOUNTER — Encounter: Payer: Self-pay | Admitting: Family Medicine

## 2017-01-14 ENCOUNTER — Ambulatory Visit (INDEPENDENT_AMBULATORY_CARE_PROVIDER_SITE_OTHER): Payer: 59 | Admitting: Family Medicine

## 2017-01-14 DIAGNOSIS — I10 Essential (primary) hypertension: Secondary | ICD-10-CM

## 2017-01-14 DIAGNOSIS — E119 Type 2 diabetes mellitus without complications: Secondary | ICD-10-CM | POA: Diagnosis not present

## 2017-01-14 LAB — BASIC METABOLIC PANEL
BUN / CREAT RATIO: 10 (ref 9–23)
BUN: 7 mg/dL (ref 6–20)
CHLORIDE: 105 mmol/L (ref 96–106)
CO2: 21 mmol/L (ref 20–29)
Calcium: 9.1 mg/dL (ref 8.7–10.2)
Creatinine, Ser: 0.7 mg/dL (ref 0.57–1.00)
GFR calc Af Amer: 126 mL/min/{1.73_m2} (ref >59)
GFR calc non Af Amer: 109 mL/min/{1.73_m2} (ref >59)
GLUCOSE: 116 mg/dL — AB (ref 65–99)
POTASSIUM: 4.3 mmol/L (ref 3.5–5.2)
SODIUM: 139 mmol/L (ref 134–144)

## 2017-01-14 LAB — POCT GLYCOSYLATED HEMOGLOBIN (HGB A1C): Hemoglobin A1C: 7

## 2017-01-14 MED ORDER — METFORMIN HCL ER 750 MG PO TB24: 750.0000 mg | ORAL_TABLET | Freq: Every day | ORAL | 1 refills | Status: DC

## 2017-01-14 MED FILL — METFORMIN HCL ER 750 MG TAB: 750 | 90 days supply | Qty: 90 | Fill #0

## 2017-01-14 MED FILL — LISINOPRIL 10 MG TABS: 10 | 90 days supply | Qty: 90 | Fill #1

## 2017-01-14 NOTE — Patient Instructions (Addendum)
   IF you received an x-ray today, you will receive an invoice from [facility]. Please contact [facility] at [phone] with questions or concerns regarding your invoice.   IF you received labwork today, you will receive an invoice from [facility]. Please contact [facility] at [phone] with questions or concerns regarding your invoice.   Our billing staff will not be able to assist you with questions regarding bills from these companies.  You will be contacted with the lab results as soon as they are available. The fastest way to get your results is to activate your My Chart account. Instructions are located on the last page of this paperwork. If you have not heard from us regarding the results in 2 weeks, please contact this office.     Exercising to Lose Weight Exercising can help you to lose weight. In order to lose weight through exercise, you need to do vigorous-intensity exercise. You can tell that you are exercising with vigorous intensity if you are breathing very hard and fast and cannot hold a conversation while exercising. Moderate-intensity exercise helps to maintain your current weight. You can tell that you are exercising at a moderate level if you have a higher heart rate and faster breathing, but you are still able to hold a conversation. How often should I exercise? Choose an activity that you enjoy and set realistic goals. Your health care provider can help you to make an activity plan that works for you. Exercise regularly as directed by your health care provider. This may include:  Doing resistance training twice each week, such as: ? Push-ups. ? Sit-ups. ? Lifting weights. ? Using resistance bands.  Doing a given intensity of exercise for a given amount of time. Choose from these options: ? 150 minutes of moderate-intensity exercise every week. ? 75 minutes of vigorous-intensity exercise every week. ? A mix of moderate-intensity and  vigorous-intensity exercise every week.  Children, pregnant women, people who are out of shape, people who are overweight, and older adults may need to consult a health care provider for individual recommendations. If you have any sort of medical condition, be sure to consult your health care provider before starting a new exercise program. What are some activities that can help me to lose weight?  Walking at a rate of at least 4.5 miles an hour.  Jogging or running at a rate of 5 miles per hour.  Biking at a rate of at least 10 miles per hour.  Lap swimming.  Roller-skating or in-line skating.  Cross-country skiing.  Vigorous competitive sports, such as football, basketball, and soccer.  Jumping rope.  Aerobic dancing. How can I be more active in my day-to-day activities?  Use the stairs instead of the elevator.  Take a walk during your lunch break.  If you drive, park your car farther away from work or school.  If you take public transportation, get off one stop early and walk the rest of the way.  Make all of your phone calls while standing up and walking around.  Get up, stretch, and walk around every 30 minutes throughout the day. What guidelines should I follow while exercising?  Do not exercise so much that you hurt yourself, feel dizzy, or get very short of breath.  Consult your health care provider prior to starting a new exercise program.  Wear comfortable clothes and shoes with good support.  Drink plenty of water while you exercise to prevent dehydration or heat stroke. Body water is   lost during exercise and must be replaced.  Work out until you breathe faster and your heart beats faster. This information is not intended to replace advice given to you by your health care provider. Make sure you discuss any questions you have with your health care provider. Document Released: 03/10/2010 Document Revised: 07/14/2015 Document Reviewed: 07/09/2013 Elsevier  Interactive Patient Education  2018 Elsevier Inc.  

## 2017-01-23 ENCOUNTER — Ambulatory Visit (INDEPENDENT_AMBULATORY_CARE_PROVIDER_SITE_OTHER): Payer: 59 | Admitting: Physician Assistant

## 2017-01-23 ENCOUNTER — Encounter: Payer: Self-pay | Admitting: Physician Assistant

## 2017-01-23 VITALS — BP 130/80 | HR 96 | Temp 98.3°F | Resp 17 | Ht 62.0 in | Wt 266.0 lb

## 2017-01-23 DIAGNOSIS — N898 Other specified noninflammatory disorders of vagina: Secondary | ICD-10-CM | POA: Diagnosis not present

## 2017-01-23 LAB — POCT WET + KOH PREP
Trich by wet prep: ABSENT
YEAST BY KOH: ABSENT
Yeast by wet prep: ABSENT

## 2017-01-23 MED ORDER — FLUCONAZOLE 150 MG PO TABS: 150.0000 mg | ORAL_TABLET | Freq: Once | ORAL | 0 refills | Status: AC

## 2017-01-23 MED FILL — FLUCONAZOLE 150 MG TABLET: 150 | 2 days supply | Qty: 2 | Fill #0

## 2017-01-23 NOTE — Progress Notes (Signed)
PRIMARY CARE AT [facility] [address] [place] [address] [phone]  Date:  01/23/2017   Name:  Christine Best   DOB:  1975-11-17   MRN:  [id number]  PCP:  Patient, No Pcp Per    History of Present Illness:  Christine Best is a 41 y.o. female patient who presents to PCP with  Chief Complaint  Patient presents with  . Vaginal Discharge     1 week ago, discharge. No pruritic character.  Not really odorous.  Discharge is white and thick.  No abdominal pain.  No nausea.  No dysuria, hematuria.  She is urinating more often than she know  Diabetic a1c 7.  She is not sexually active.   There are no active problems to display for this patient.   No past medical history on file.  Past Surgical History:  Procedure Laterality Date  . MYOMECTOMY      Social History   Tobacco Use  . Smoking status: Never Smoker  . Smokeless tobacco: Never Used  Substance Use Topics  . Alcohol use: Yes    Alcohol/week: 0.0 oz    Comment: occasional  . Drug use: No    Family History  Problem Relation Age of Onset  . Cancer Neg Hx     No Known Allergies  Medication list has been reviewed and updated.  Current Outpatient Medications on File Prior to Visit  Medication Sig Dispense Refill  . lisinopril (PRINIVIL,ZESTRIL) 10 MG tablet Take 1 tablet (10 mg total) by mouth daily. 90 tablet 1  . medroxyPROGESTERone (PROVERA) 10 MG tablet Take 10 mg by mouth daily.    . metFORMIN (GLUCOPHAGE-XR) 750 MG 24 hr tablet Take 1 tablet (750 mg total) by mouth daily with breakfast. 90 tablet 1   No current facility-administered medications on file prior to visit.     ROS ROS otherwise unremarkable unless listed above.  Physical Examination: BP 130/80   Pulse 96   Temp 98.3 F (36.8 C) (Oral)   Resp 17   Ht 5\' 2"  (1.575 m)   Wt 266 lb (120.7 kg)   LMP 01/20/2017 (Approximate)   SpO2 98%   BMI 48.65 kg/m  Ideal Body Weight: Weight in (lb) to have BMI = 25: 136.4  Physical Exam   Constitutional: She is oriented to person, place, and time. She appears well-developed and well-nourished. No distress.  HENT:  Head: Normocephalic and atraumatic.  Right Ear: External ear normal.  Left Ear: External ear normal.  Eyes: Conjunctivae and EOM are normal. Pupils are equal, round, and reactive to light.  Cardiovascular: Normal rate.  Pulmonary/Chest: Effort normal. No respiratory distress.  Neurological: She is alert and oriented to person, place, and time.  Skin: She is not diaphoretic.  Psychiatric: She has a normal mood and affect. Her behavior is normal.     Assessment and Plan: Christine Best is a 41 y.o. female who is here today for cc of  Chief Complaint  Patient presents with  . Vaginal Discharge  will treat for yeast, however if this is not improved, she will return. Vaginal discharge - Plan: POCT Wet + KOH Prep, fluconazole (DIFLUCAN) 150 MG tablet  Ivar Drape, PA-C [facility] 12/7/20189:42 AM

## 2017-01-23 NOTE — Patient Instructions (Signed)
     IF you received an x-ray today, you will receive an invoice from [facility]. Please contact [facility] at [phone] with questions or concerns regarding your invoice.   IF you received labwork today, you will receive an invoice from [facility]. Please contact [facility] at [phone] with questions or concerns regarding your invoice.   Our billing staff will not be able to assist you with questions regarding bills from these companies.  You will be contacted with the lab results as soon as they are available. The fastest way to get your results is to activate your My Chart account. Instructions are located on the last page of this paperwork. If you have not heard from us regarding the results in 2 weeks, please contact this office.     

## 2017-01-24 ENCOUNTER — Ambulatory Visit: Payer: 59 | Admitting: Family Medicine

## 2017-02-04 ENCOUNTER — Telehealth: Payer: Self-pay | Admitting: Family Medicine

## 2017-02-04 NOTE — Telephone Encounter (Signed)
MyChart message sent to pt about reschedule their apt on 02/21/16 with stallings

## 2017-02-20 ENCOUNTER — Ambulatory Visit: Payer: Self-pay | Admitting: Family Medicine

## 2017-04-02 NOTE — Progress Notes (Signed)
Chief Complaint  Patient presents with  . Hypertension    follow up  . Diabetes    follow up    HPI   Diabetes Mellitus: Patient presents for follow up of diabetes. Symptoms: none.   Pt was seen for diabetes follow up 01/14/17 She is here today for diabetes follow up Metformin ER was prescribed for her diabetes metformin XR 750mg  with breakfast. She denies diarrhea, upset stomach, or nausea She denies hypoglycemia Lab Results  Component Value Date   HGBA1C 6.1 04/03/2017   Component     Latest Ref Rng & Units 08/02/2016 10/09/2016 01/14/2017  Hemoglobin A1C      6.9 6.5 (H) 7    Hypertension: Patient here for follow-up of elevated blood pressure. She is not exercising and is not adherent to low salt diet.  Blood pressure is well controlled at home. Cardiac symptoms none. Patient denies chest pain, chest pressure/discomfort, claudication, exertional chest pressure/discomfort, fatigue, irregular heart beat and near-syncope.  Cardiovascular risk factors: diabetes mellitus, hypertension, obesity (BMI >= 30 kg/m2) and sedentary lifestyle. Use of agents associated with hypertension: none. History of target organ damage: none. BP Readings from Last 3 Encounters:  04/03/17 123/82  01/23/17 130/80  01/14/17 120/86    Morbid Obesity She reports that she is not exercise  She states that she is losing weight She states that she has kept everything about the same  She really wants to try Saxenda to continue to lose weight She has read about it and is very interested  Body mass index is 47.52 kg/m. Wt Readings from Last 3 Encounters:  04/03/17 259 lb 12.8 oz (117.8 kg)  01/23/17 266 lb (120.7 kg)  01/14/17 262 lb 9.6 oz (119.1 kg)     No past medical history on file.  Current Outpatient Medications  Medication Sig Dispense Refill  . lisinopril (PRINIVIL,ZESTRIL) 10 MG tablet Take 1 tablet (10 mg total) by mouth daily. 90 tablet 1  . metFORMIN (GLUCOPHAGE-XR) 750 MG 24 hr  tablet Take 1 tablet (750 mg total) by mouth daily with breakfast. 90 tablet 1  . Liraglutide -Weight Management (SAXENDA) 18 MG/3ML SOPN Inject 0.6 mg into the skin once a week. Increase to 0.6mg  weekly to goal of 3mg  3 mL 3   No current facility-administered medications for this visit.     Allergies: No Known Allergies  Past Surgical History:  Procedure Laterality Date  . MYOMECTOMY      Social History   Socioeconomic History  . Marital status: Single    Spouse name: None  . Number of children: None  . Years of education: None  . Highest education level: None  Social Needs  . Financial resource strain: None  . Food insecurity - worry: None  . Food insecurity - inability: None  . Transportation needs - medical: None  . Transportation needs - non-medical: None  Occupational History  . None  Tobacco Use  . Smoking status: Never Smoker  . Smokeless tobacco: Never Used  Substance and Sexual Activity  . Alcohol use: Yes    Alcohol/week: 0.0 oz    Comment: occasional  . Drug use: No  . Sexual activity: Yes    Birth control/protection: None  Other Topics Concern  . None  Social History Narrative  . None    Family History  Problem Relation Age of Onset  . Cancer Neg Hx      ROS Review of Systems See HPI Constitution: No fevers or chills No malaise No  diaphoresis Skin: No rash or itching Eyes: no blurry vision, no double vision GU: no dysuria or hematuria Neuro: no dizziness or headaches  all others reviewed and negative   Objective: Vitals:   04/03/17 0954  BP: 123/82  Pulse: 97  Resp: 16  Temp: 98.6 F (37 C)  TempSrc: Oral  SpO2: 99%  Weight: 259 lb 12.8 oz (117.8 kg)  Height: 5\' 2"  (1.575 m)    Physical Exam  Constitutional: She is oriented to person, place, and time. She appears well-developed and well-nourished.  HENT:  Head: Normocephalic and atraumatic.  Eyes: Conjunctivae and EOM are normal.  Cardiovascular: Normal rate and regular  rhythm.  No murmur heard. Pulmonary/Chest: Effort normal and breath sounds normal. No stridor. No respiratory distress.  Neurological: She is alert and oriented to person, place, and time.  Skin: Skin is warm. Capillary refill takes less than 2 seconds.  Psychiatric: She has a normal mood and affect. Her behavior is normal. Judgment and thought content normal.    Assessment and Plan Eniola was seen today for hypertension and diabetes.  Diagnoses and all orders for this visit:  Type 2 diabetes mellitus with complication, unspecified whether long term insulin use ([place])- well controlled on metformin -     POCT glycosylated hemoglobin (Hb A1C) -     Comprehensive metabolic panel -     Lipid panel  Morbid obesity ([place])- will try Saxenda at 0.6mg  to increase by 0.6mg  per week to a goal of 3mg  weekly She understands the instructions She will also taper down metformin and monitor for signs of hypoglycemia -     Liraglutide -Weight Management (SAXENDA) 18 MG/3ML SOPN; Inject 0.6 mg into the skin once a week. Increase to 0.6mg  weekly to goal of 3mg   Essential hypertension- bp at goal, continue lisinopril, add exercise Try DASH diet     Milliani Herrada A Nolon Rod

## 2017-04-03 ENCOUNTER — Ambulatory Visit (INDEPENDENT_AMBULATORY_CARE_PROVIDER_SITE_OTHER): Payer: 59 | Admitting: Family Medicine

## 2017-04-03 ENCOUNTER — Other Ambulatory Visit: Payer: Self-pay | Admitting: Family Medicine

## 2017-04-03 ENCOUNTER — Other Ambulatory Visit: Payer: Self-pay

## 2017-04-03 ENCOUNTER — Encounter: Payer: Self-pay | Admitting: Family Medicine

## 2017-04-03 VITALS — BP 123/82 | HR 97 | Temp 98.6°F | Resp 16 | Ht 62.0 in | Wt 259.8 lb

## 2017-04-03 DIAGNOSIS — I10 Essential (primary) hypertension: Secondary | ICD-10-CM | POA: Diagnosis not present

## 2017-04-03 DIAGNOSIS — E118 Type 2 diabetes mellitus with unspecified complications: Secondary | ICD-10-CM | POA: Diagnosis not present

## 2017-04-03 DIAGNOSIS — I152 Hypertension secondary to endocrine disorders: Secondary | ICD-10-CM | POA: Insufficient documentation

## 2017-04-03 LAB — POCT GLYCOSYLATED HEMOGLOBIN (HGB A1C): HEMOGLOBIN A1C: 6.1

## 2017-04-03 MED ORDER — LIRAGLUTIDE -WEIGHT MANAGEMENT 18 MG/3ML ~~LOC~~ SOPN: 0.6000 mg | PEN_INJECTOR | SUBCUTANEOUS | 3 refills | Status: DC

## 2017-04-03 NOTE — Telephone Encounter (Signed)
Refill request for lisinopril 10 mg #90 with 2 refills approved.  Pt seen in office today by dr Nolon Rod for HTN/DM followup. Dgaddy, CMA

## 2017-04-03 NOTE — Patient Instructions (Signed)
     IF you received an x-ray today, you will receive an invoice from [facility]. Please contact [facility] at [phone] with questions or concerns regarding your invoice.   IF you received labwork today, you will receive an invoice from [facility]. Please contact [facility] at [phone] with questions or concerns regarding your invoice.   Our billing staff will not be able to assist you with questions regarding bills from these companies.  You will be contacted with the lab results as soon as they are available. The fastest way to get your results is to activate your My Chart account. Instructions are located on the last page of this paperwork. If you have not heard from us regarding the results in 2 weeks, please contact this office.     

## 2017-04-04 LAB — LIPID PANEL
CHOL/HDL RATIO: 3.2 ratio (ref 0.0–4.4)
CHOLESTEROL TOTAL: 168 mg/dL (ref 100–199)
HDL: 52 mg/dL (ref >39)
LDL Calculated: 107 mg/dL — ABNORMAL HIGH (ref 0–99)
TRIGLYCERIDES: 45 mg/dL (ref 0–149)
VLDL Cholesterol Cal: 9 mg/dL (ref 5–40)

## 2017-04-04 LAB — COMPREHENSIVE METABOLIC PANEL
ALK PHOS: 80 IU/L (ref 39–117)
ALT: 10 IU/L (ref 0–32)
AST: 11 IU/L (ref 0–40)
Albumin/Globulin Ratio: 1.4 (ref 1.2–2.2)
Albumin: 4.1 g/dL (ref 3.5–5.5)
BUN/Creatinine Ratio: 8 — ABNORMAL LOW (ref 9–23)
BUN: 6 mg/dL (ref 6–20)
Bilirubin Total: 0.3 mg/dL (ref 0.0–1.2)
CALCIUM: 8.9 mg/dL (ref 8.7–10.2)
CO2: 20 mmol/L (ref 20–29)
Chloride: 102 mmol/L (ref 96–106)
Creatinine, Ser: 0.73 mg/dL (ref 0.57–1.00)
GFR calc Af Amer: 120 mL/min/{1.73_m2} (ref >59)
GFR, EST NON AFRICAN AMERICAN: 104 mL/min/{1.73_m2} (ref >59)
GLOBULIN, TOTAL: 2.9 g/dL (ref 1.5–4.5)
Glucose: 105 mg/dL — ABNORMAL HIGH (ref 65–99)
Potassium: 4.3 mmol/L (ref 3.5–5.2)
SODIUM: 138 mmol/L (ref 134–144)
Total Protein: 7 g/dL (ref 6.0–8.5)

## 2017-04-09 ENCOUNTER — Encounter: Payer: Self-pay | Admitting: Family Medicine

## 2017-04-09 MED FILL — LISINOPRIL 10 MG TABS: 10 | 90 days supply | Qty: 90 | Fill #0

## 2017-04-15 MED FILL — METFORMIN HCL ER 750 MG TAB: 750 | 90 days supply | Qty: 90 | Fill #1

## 2017-04-18 ENCOUNTER — Telehealth: Payer: Self-pay | Admitting: Family Medicine

## 2017-04-18 NOTE — Telephone Encounter (Signed)
Copied from [place]. Topic: Quick Communication - See Telephone Encounter >> Apr 18, 2017 11:48 AM Bea Graff, NT wrote: CRM for notification. See Telephone encounter for: Christine Best from [facility] calling in regards to the PA for  Liraglutide -Weight Management (SAXENDA). She also faxed over the request. CB#: [phone] Ref#: [id number]  04/18/17.

## 2017-04-23 ENCOUNTER — Telehealth: Payer: Self-pay | Admitting: *Deleted

## 2017-04-23 NOTE — Telephone Encounter (Signed)
Margreta Journey called back in to follow up. Called office and spoke with Almyra Free she pulled pt's PA and stated that she will work on it now. It can take from 72 hrs to a week. I did advise Margreta Journey, she said that she will watch the progress as we work on it and then call back to follow up at a later date.     CB#: [phone] Ref#: [id number]

## 2017-04-23 NOTE — Telephone Encounter (Signed)
PA STARTED FOR SAXENDA  24 TO 72 HOURS

## 2017-04-30 ENCOUNTER — Telehealth: Payer: Self-pay | Admitting: General Practice

## 2017-04-30 NOTE — Telephone Encounter (Signed)
Phone call to [facility], spoke with [facility]. She states information requested will be sent to team for prior authorization, will hear back shortly.

## 2017-04-30 NOTE — Telephone Encounter (Signed)
Copied from [place]. Topic: Quick Communication - See Telephone Encounter >> Apr 30, 2017  2:35 PM Conception Chancy, NT wrote: CRM for notification. See Telephone encounter for:  04/30/17.  Colletta [place] is calling from [employer] and states she is working on a prior authroization for Liraglutide -Weight Management ([place]) 18 MG/3ML SOPN  and she has a few clinical questions. She can be reached at [phone] reference # [id number]

## 2017-05-01 NOTE — Telephone Encounter (Signed)
Phone call to [facility], spoke with [place]. He states medication is currently under review. Should see response in 24-72 hours.

## 2017-05-01 NOTE — Telephone Encounter (Signed)
Phone call to [employer], spoke with [place]. He states he has multiple open prior authorizations for patient's Saxenda. He states quantity and day supply is needed, recommends calling [facility] to follow up at [phone].

## 2017-05-01 NOTE — Telephone Encounter (Signed)
Copied from [place] [id number]. Topic: General - Other >> May 01, 2017  9:36 AM Yvette Rack wrote: Reason for CRM: Carmell Austria from [facility] calling stating that Liraglutide -Weight Management ([place]) 18 MG/3ML SOPN need a prior authorization and they are asking for which quantity for the medicine the phone number is [phone] reference [id number]

## 2017-05-02 ENCOUNTER — Telehealth: Payer: Self-pay | Admitting: Family Medicine

## 2017-05-02 NOTE — Telephone Encounter (Signed)
Copied from [employer] [id number]. Topic: General - Other >> May 01, 2017  9:36 AM Yvette Rack wrote: Reason for CRM: Carmell Austria from [place] [facility] calling stating that Liraglutide -Weight Management ([place]) 18 MG/3ML SOPN need a prior authorization and they are asking for which quantity for the medicine the phone number is [phone] reference [id number]  >> May 02, 2017  1:25 PM Cleaster Corin, NT wrote: [facility] calling from [employer] to ask another question has pt. previsolsy tried another med. For weight loss if so what is the name of med. Hedi can be reached at [phone] or fax [phone]

## 2017-05-03 NOTE — Telephone Encounter (Signed)
Copied from [place] [id number]. Topic: General - Other >> May 01, 2017  9:36 AM Yvette Rack wrote: Reason for CRM: Carmell Austria from [facility] calling stating that Liraglutide -Weight Management ([facility]) 18 MG/3ML SOPN need a prior authorization and they are asking for which quantity for the medicine the phone number is [phone] reference [id number]  >> May 02, 2017  1:25 PM Cleaster Corin, NT wrote: [facility] calling from [employer] to ask another question has pt. previsolsy tried another med. For weight loss if so what is the name of med. Hedi can be reached at [phone] or fax [phone]

## 2017-05-03 NOTE — Telephone Encounter (Signed)
Phone call to [employer]. Spoke with [employer]. Multiple requests are open for patient medication. Will close out others and leave open [id number] which has been submitted to plan.

## 2017-05-03 NOTE — Telephone Encounter (Signed)
Duplicate encounter. See 04/30/2017 phone encounter.

## 2017-05-14 ENCOUNTER — Encounter: Payer: Self-pay | Admitting: Gastroenterology

## 2017-05-28 NOTE — Telephone Encounter (Signed)
I spoke with the pharmacy they said they sent a fax with one question that needed to be answered. We resent it with the one question answered and should know in 24-48 hours with results

## 2017-05-29 ENCOUNTER — Encounter: Payer: Self-pay | Admitting: Physician Assistant

## 2017-06-03 ENCOUNTER — Telehealth: Payer: Self-pay | Admitting: *Deleted

## 2017-06-03 DIAGNOSIS — E119 Type 2 diabetes mellitus without complications: Secondary | ICD-10-CM

## 2017-06-03 NOTE — Telephone Encounter (Signed)
Dr.Stallings they are denying the Saxenda.    State that the patient needs to try Belviq,Belviq XR,contrave,diethylpropion,phendimetrazine,phentermine,qsymia, xenical

## 2017-06-04 NOTE — Telephone Encounter (Signed)
Please let the patient know that I do not prescribe any of those other medications but I can send her over to Dr. Dennard Nip for weight management.  She has been very successful in helping with medical weight loss for my other patients.  She should stay on the metformin for her diabetes. Her approach is comprehensive and helps with nutrition and weight management.

## 2017-06-05 NOTE — Telephone Encounter (Signed)
Left vm for pt letting her know Kirke Shaggy was denied and other medications suggested are not medications Dr. Nolon Rod will prescribe. I also let her know we have referred for Dr. Leafy Ro for weight management and provided their phone number for her to call and schedule. Their office has already tried to call her on 06/04/17, so I told pt she can call back to get this set up. Thanks!

## 2017-06-05 NOTE — Telephone Encounter (Signed)
Wonderful. 

## 2017-06-14 MED FILL — HYDROCODON-APAP 5-325: 5-325 | 2 days supply | Qty: 20 | Fill #0

## 2017-06-14 MED FILL — IBUPROFEN 600 MG TABLET: 600 | 5 days supply | Qty: 20 | Fill #0

## 2017-06-14 MED FILL — CHLORHEXIDINE 0.12% RINSE: 0.12 | 16 days supply | Qty: 473 | Fill #0

## 2017-06-14 MED FILL — AMOXICILLIN 500 MG CAPSULE: 500 | 7 days supply | Qty: 21 | Fill #0

## 2017-07-02 ENCOUNTER — Ambulatory Visit (INDEPENDENT_AMBULATORY_CARE_PROVIDER_SITE_OTHER): Payer: 59 | Admitting: Gastroenterology

## 2017-07-02 ENCOUNTER — Encounter: Payer: Self-pay | Admitting: Gastroenterology

## 2017-07-02 VITALS — BP 132/92 | HR 116 | Ht 61.75 in | Wt 257.4 lb

## 2017-07-02 DIAGNOSIS — K5900 Constipation, unspecified: Secondary | ICD-10-CM | POA: Diagnosis not present

## 2017-07-02 DIAGNOSIS — K6289 Other specified diseases of anus and rectum: Secondary | ICD-10-CM

## 2017-07-02 DIAGNOSIS — K625 Hemorrhage of anus and rectum: Secondary | ICD-10-CM

## 2017-07-02 DIAGNOSIS — K602 Anal fissure, unspecified: Secondary | ICD-10-CM | POA: Diagnosis not present

## 2017-07-02 MED ORDER — AMBULATORY NON FORMULARY MEDICATION: 1 refills | Status: DC

## 2017-07-02 NOTE — Progress Notes (Signed)
Christine Best    [id number]    11-01-1975  Primary Care Physician:Stallings, Arlie Solomons, MD  Referring Physician: No referring provider defined for this encounter.  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  She has had on and off constipation her whole life.  Recently got started on treatment for hypertension and diabetes about 2 years ago.  She has also modified her diet and is exercising.  About 6 months ago she started taking Colace and MiraLAX daily and is having bowel movement every other day.  Prior to that she could go about a week without a bowel movement.  She has intermittent bright red blood per rectum and she has to strain or has hard stool.  Also has associated generalized abdominal discomfort and cramping when she is constipated.  Denies any dysphagia, vomiting, melena or unintentional weight loss. No family history of colon cancer or GI malignancy or IBD    Outpatient Encounter Medications as of 07/02/2017  Medication Sig  . lisinopril (PRINIVIL,ZESTRIL) 10 MG tablet TAKE 1 TABLET BY MOUTH DAILY.  . metFORMIN (GLUCOPHAGE-XR) 750 MG 24 hr tablet Take 1 tablet (750 mg total) by mouth daily with breakfast.  . [DISCONTINUED] Liraglutide -Weight Management (SAXENDA) 18 MG/3ML SOPN Inject 0.6 mg into the skin once a week. Increase to 0.6mg  weekly to goal of 3mg    No facility-administered encounter medications on file as of 07/02/2017.     Allergies as of 07/02/2017  . (No Known Allergies)    Past Medical History:  Diagnosis Date  . DM (diabetes mellitus) ([place])   . HTN (hypertension)     Past Surgical History:  Procedure Laterality Date  . MYOMECTOMY      Family History  Problem Relation Age of Onset  . Diabetes Mother   . Diabetes Brother   . Cancer Maternal Aunt       type unknown    Social History   Socioeconomic History  . Marital status: Single    Spouse name: Not on file  . Number of children: 0  . Years of education: Not on file  . Highest education level: Not on file  Occupational History  . Occupation: Nurse  Social Needs  . Financial resource strain: Not on file  . Food insecurity:    Worry: Not on file    Inability: Not on file  . Transportation needs:    Medical: Not on file    Non-medical: Not on file  Tobacco Use  . Smoking status: Never Smoker  . Smokeless tobacco: Never Used  Substance and Sexual Activity  . Alcohol use: Yes    Alcohol/week: 0.0 oz    Comment: occasional  . Drug use: No  . Sexual activity: Yes    Birth control/protection: None  Lifestyle  . Physical activity:    Days per week: Not on file    Minutes per session: Not on file  . Stress: Not on file  Relationships  . Social connections:    Talks on phone: Not on file    Gets together: Not on file    Attends religious service: Not on file    Active member of club or organization: Not on file    Attends meetings of clubs or organizations: Not on file    Relationship status: Not  on file  . Intimate partner violence:    Fear of current or ex partner: Not on file    Emotionally abused: Not on file    Physically abused: Not on file    Forced sexual activity: Not on file  Other Topics Concern  . Not on file  Social History Narrative  . Not on file      Review of systems: Review of Systems  Constitutional: Negative for fever and chills.  HENT: Negative.   Eyes: Negative for blurred vision.  Respiratory: Negative for cough, shortness of breath and wheezing.   Cardiovascular: Negative for chest pain and palpitations.  Gastrointestinal: as per HPI Genitourinary: Negative for dysuria, urgency, frequency and hematuria.  Musculoskeletal: Negative for myalgias, back pain and joint pain.  Skin: Negative for itching and rash.  Neurological: Negative  for dizziness, tremors, focal weakness, seizures and loss of consciousness.  Endo/Heme/Allergies: Negative for seasonal allergies.  Psychiatric/Behavioral: Negative for depression, suicidal ideas and hallucinations.  All other systems reviewed and are negative.   Physical Exam: Vitals:   07/02/17 0915  BP: (!) 132/92  Pulse: (!) 116   Body mass index is 47.46 kg/m. Gen:      No acute distress HEENT:  EOMI, sclera anicteric Neck:     No masses; no thyromegaly Lungs:    Clear to auscultation bilaterally; normal respiratory effort CV:         Regular rate and rhythm; no murmurs Abd:      + bowel sounds; soft, non-tender; no palpable masses, no distension Ext:    No edema; adequate peripheral perfusion Skin:      Warm and dry; no rash Neuro: alert and oriented x 3 Psych: normal mood and affect Rectal exam: Increased anal sphincter tone with tenderness, no external hemorrhoids, +anal fissure   Data Reviewed:  Reviewed labs, radiology imaging, old records and pertinent past GI work up   Assessment and Plan/Recommendations: 42 year old female with history of obesity, hypertension, diabetes, chronic constipation with complaints of intermittent bright red blood per rectum. On exam has posterior anal fissure  Start applying small pea-sized amount of 0.125% nitroglycerin per rectum 2-3 times daily for 4 to 6 weeks  Constipation: Continue MiraLAX daily Benefiber 1 tablespoon 3 times daily with meals Increase water intake to 8 to 10 cups daily  Return in 2 months  If continues to have persistent rectal bleeding or anal fissure does not heal, will consider colonoscopy for further evaluation and to exclude inflammatory bowel disease or a neoplastic lesion.    Damaris Hippo , MD [phone]    CC: No ref. provider found

## 2017-07-02 NOTE — Patient Instructions (Addendum)
We have given you a printed prescription of Nitroglycerin Ointment to take to [facility]  Take Benefiber 1 tablespoon three times a day  Increase water intake 8-10 cups per day  If you are age 40 or older, your body mass index should be between 23-30. Your Body mass index is 47.46 kg/m. If this is out of the aforementioned range listed, please consider follow up with your Primary Care Provider.  If you are age 81 or younger, your body mass index should be between 19-25. Your Body mass index is 47.46 kg/m. If this is out of the aformentioned range listed, please consider follow up with your Primary Care Provider.

## 2017-07-04 ENCOUNTER — Encounter: Payer: Self-pay | Admitting: Gastroenterology

## 2017-07-09 ENCOUNTER — Other Ambulatory Visit: Payer: Self-pay | Admitting: Family Medicine

## 2017-07-09 DIAGNOSIS — E119 Type 2 diabetes mellitus without complications: Secondary | ICD-10-CM

## 2017-07-09 MED FILL — LISINOPRIL 10 MG TABS: 10 | 90 days supply | Qty: 90 | Fill #1

## 2017-07-10 MED FILL — METFORMIN HCL ER 750 MG TAB: 750 | 90 days supply | Qty: 90 | Fill #0

## 2017-07-10 NOTE — Telephone Encounter (Signed)
Metformin refill Last OV: 04/03/17 Last Refill:01/14/17 #90 tab 1 RF Pharmacy:[facility] Dr Nolon Rod

## 2017-07-16 ENCOUNTER — Encounter: Payer: Self-pay | Admitting: Family Medicine

## 2017-07-24 ENCOUNTER — Encounter (INDEPENDENT_AMBULATORY_CARE_PROVIDER_SITE_OTHER): Payer: Self-pay

## 2017-07-25 ENCOUNTER — Encounter: Payer: 59 | Admitting: Family Medicine

## 2017-07-26 ENCOUNTER — Encounter: Payer: 59 | Admitting: Family Medicine

## 2017-07-26 ENCOUNTER — Encounter: Payer: Self-pay | Admitting: Family Medicine

## 2017-07-26 ENCOUNTER — Ambulatory Visit (INDEPENDENT_AMBULATORY_CARE_PROVIDER_SITE_OTHER): Payer: 59 | Admitting: Family Medicine

## 2017-07-26 ENCOUNTER — Other Ambulatory Visit: Payer: Self-pay

## 2017-07-26 VITALS — BP 140/80 | HR 99 | Temp 97.8°F | Resp 20 | Ht 63.78 in | Wt 257.0 lb

## 2017-07-26 DIAGNOSIS — I10 Essential (primary) hypertension: Secondary | ICD-10-CM | POA: Diagnosis not present

## 2017-07-26 DIAGNOSIS — Z124 Encounter for screening for malignant neoplasm of cervix: Secondary | ICD-10-CM

## 2017-07-26 DIAGNOSIS — Z0000 Encounter for general adult medical examination without abnormal findings: Secondary | ICD-10-CM | POA: Diagnosis not present

## 2017-07-26 DIAGNOSIS — E119 Type 2 diabetes mellitus without complications: Secondary | ICD-10-CM | POA: Diagnosis not present

## 2017-07-26 LAB — POCT GLYCOSYLATED HEMOGLOBIN (HGB A1C): Hemoglobin A1C: 6.4 % — AB (ref 4.0–5.6)

## 2017-07-26 NOTE — Patient Instructions (Signed)
     IF you received an x-ray today, you will receive an invoice from [facility]. Please contact [facility] at [phone] with questions or concerns regarding your invoice.   IF you received labwork today, you will receive an invoice from [facility]. Please contact [facility] at [phone] with questions or concerns regarding your invoice.   Our billing staff will not be able to assist you with questions regarding bills from these companies.  You will be contacted with the lab results as soon as they are available. The fastest way to get your results is to activate your My Chart account. Instructions are located on the last page of this paperwork. If you have not heard from us regarding the results in 2 weeks, please contact this office.     

## 2017-07-26 NOTE — Progress Notes (Signed)
Chief Complaint  Patient presents with  . Annual Exam    Subjective:  Christine Best is a 42 y.o. female here for a health maintenance visit.  Patient is established pt  Patient Active Problem List   Diagnosis Date Noted  . Type 2 diabetes mellitus with complication ([place]) 18/84/1660  . Morbid obesity ([place]) 04/03/2017  . Essential hypertension 04/03/2017    Past Medical History:  Diagnosis Date  . Anemia   . DM (diabetes mellitus) ([place])   . HTN (hypertension)     Past Surgical History:  Procedure Laterality Date  . MYOMECTOMY       Outpatient Medications Prior to Visit  Medication Sig Dispense Refill  . AMBULATORY NON FORMULARY MEDICATION Medication Name: 0.125% nitroglycerin 1 pea sized amount per rectum 2-3 times daily 30 g 1  . lisinopril (PRINIVIL,ZESTRIL) 10 MG tablet TAKE 1 TABLET BY MOUTH DAILY. 90 tablet 2  . metFORMIN (GLUCOPHAGE-XR) 750 MG 24 hr tablet TAKE 1 TABLET BY MOUTH ONCE DAILY WITH BREAKFAST 90 tablet 1   No facility-administered medications prior to visit.     No Known Allergies   Family History  Problem Relation Age of Onset  . Diabetes Mother   . Hypertension Mother   . Cancer Father   . Diabetes Brother   . Cancer Maternal Aunt        type unknown     Health Habits: Dental Exam: up to date Eye Exam: up to date Exercise: walks while at work but no other activities  Current exercise activities: walking/running Diet:   Social History   Socioeconomic History  . Marital status: Single    Spouse name: Not on file  . Number of children: 0  . Years of education: Not on file  . Highest education level: Not on file  Occupational History  . Occupation: Nurse  Social Needs  . Financial resource strain: Not on file  . Food insecurity:    Worry: Not on file    Inability: Not on file  . Transportation needs:    Medical: Not on file    Non-medical: Not on file  Tobacco Use  . Smoking status: Never Smoker  . Smokeless tobacco: Never  Used  Substance and Sexual Activity  . Alcohol use: Yes    Alcohol/week: 0.0 oz    Comment: occasional  . Drug use: No  . Sexual activity: Yes    Birth control/protection: None  Lifestyle  . Physical activity:    Days per week: Not on file    Minutes per session: Not on file  . Stress: Not on file  Relationships  . Social connections:    Talks on phone: Not on file    Gets together: Not on file    Attends religious service: Not on file    Active member of club or organization: Not on file    Attends meetings of clubs or organizations: Not on file    Relationship status: Not on file  . Intimate partner violence:    Fear of current or ex partner: Not on file    Emotionally abused: Not on file    Physically abused: Not on file    Forced sexual activity: Not on file  Other Topics Concern  . Not on file  Social History Narrative  . Not on file   Social History   Substance and Sexual Activity  Alcohol Use Yes  . Alcohol/week: 0.0 oz   Comment: occasional   Social History  Tobacco Use  Smoking Status Never Smoker  Smokeless Tobacco Never Used   Social History   Substance and Sexual Activity  Drug Use No    GYN: Sexual Health Menstrual status: regular menses LMP: Patient's last menstrual period was 07/12/2017 (approximate). Last pap smear: see HM section History of abnormal pap smears:  Sexually active: not currently  Health Maintenance: See under health Maintenance activity for review of completion dates as well. Immunization History  Administered Date(s) Administered  . Influenza-Unspecified 10/22/2016  . Pneumococcal Polysaccharide-23 08/02/2016  . Tdap 08/02/2016      Depression Screen-PHQ2/9 Depression screen [facility] 2/9 07/26/2017 04/03/2017 01/23/2017 01/14/2017 10/09/2016  Decreased Interest 0 0 0 0 0  Down, Depressed, Hopeless 0 0 0 0 0  PHQ - 2 Score 0 0 0 0 0       Depression Severity and Treatment Recommendations:  0-4= None  5-9= Mild /  Treatment: Support, educate to call if worse; return in one month  10-14= Moderate / Treatment: Support, watchful waiting; Antidepressant or Psycotherapy  15-19= Moderately severe / Treatment: Antidepressant OR Psychotherapy  >= 20 = Major depression, severe / Antidepressant AND Psychotherapy    Review of Systems   Review of Systems  Constitutional: Negative for chills and fever.  Eyes: Negative for blurred vision, double vision and photophobia.  Gastrointestinal: Negative for abdominal pain, nausea and vomiting.  Genitourinary: Negative for dysuria and urgency.  Skin: Negative for itching and rash.  Neurological: Negative for dizziness, tingling and headaches.  Psychiatric/Behavioral: Negative for depression. The patient is not nervous/anxious and does not have insomnia.     See HPI for ROS as well.    Objective:   Vitals:   07/26/17 0836  BP: 140/80  Pulse: 99  Resp: 20  Temp: 97.8 F (36.6 C)  TempSrc: Oral  SpO2: 100%  Weight: 257 lb (116.6 kg)  Height: 5' 3.78" (1.62 m)   Wt Readings from Last 3 Encounters:  07/26/17 257 lb (116.6 kg)  07/02/17 257 lb 6 oz (116.7 kg)  04/03/17 259 lb 12.8 oz (117.8 kg)    Body mass index is 44.42 kg/m.  Physical Exam  Constitutional: She is oriented to person, place, and time. She appears well-developed and well-nourished.  HENT:  Head: Normocephalic and atraumatic.  Eyes: Conjunctivae and EOM are normal.  Neck: Normal range of motion. No thyromegaly present.  Cardiovascular: Normal rate, regular rhythm and normal heart sounds.  No murmur heard. Pulmonary/Chest: Effort normal and breath sounds normal. No stridor. No respiratory distress. She has no wheezes. She has no rales.  Abdominal: Soft. Bowel sounds are normal. She exhibits no distension. There is no tenderness. There is no guarding.  Musculoskeletal: Normal range of motion. She exhibits no edema.  Neurological: She is alert and oriented to person, place, and time.   Skin: Skin is warm. Capillary refill takes less than 2 seconds.  Psychiatric: She has a normal mood and affect. Her behavior is normal. Judgment and thought content normal.       Assessment/Plan:   Patient was seen for a health maintenance exam.  Counseled the patient on health maintenance issues. Reviewed her health mainteance schedule and ordered appropriate tests (see orders.) Counseled on regular exercise and weight management. Recommend regular eye exams and dental cleaning.   The following issues were addressed today for health maintenance:   Christine Best was seen today for annual exam.  Diagnoses and all orders for this visit:  Health maintenance examination -  Women's Health Maintenance Plan  Advised monthly breast exam and annual mammogram Advised dental exam every six months Discussed stress management Discussed pap smear screening guidelines   Well controlled diabetes mellitus ([place]) -  -     POCT glycosylated hemoglobin (Hb A1C) -     HM Diabetes Foot Exam -     Ambulatory referral to Ophthalmology  Morbid obesity ([place]) -  Stable on current diabetes regimen  Essential hypertension- bp at goal    No follow-ups on file.    Body mass index is 44.42 kg/m.:  Discussed the patient's BMI with patient. The BMI body mass index is 44.42 kg/m.     Future Appointments  Date Time Provider [place]  08/08/2017  5:45 PM [facility] & Shawnie Dapper [facility] None  09/03/2017  8:30 AM Starlyn Skeans, MD [facility] None  09/10/2017  2:15 PM Nandigam, Venia Minks, MD [facility]    Patient Instructions       IF you received an x-ray today, you will receive an invoice from [facility]. Please contact [facility] at [phone] with questions or concerns regarding your invoice.   IF you received labwork today, you will receive an invoice from [place]. Please contact [facility] at [phone] with questions or concerns regarding your  invoice.   Our billing staff will not be able to assist you with questions regarding bills from these companies.  You will be contacted with the lab results as soon as they are available. The fastest way to get your results is to activate your My Chart account. Instructions are located on the last page of this paperwork. If you have not heard from [place] regarding the results in 2 weeks, please contact this office.

## 2017-07-29 ENCOUNTER — Encounter: Payer: Self-pay | Admitting: Family Medicine

## 2017-08-07 DIAGNOSIS — Z319 Encounter for procreative management, unspecified: Secondary | ICD-10-CM | POA: Diagnosis not present

## 2017-08-07 DIAGNOSIS — D251 Intramural leiomyoma of uterus: Secondary | ICD-10-CM | POA: Diagnosis not present

## 2017-08-07 DIAGNOSIS — N979 Female infertility, unspecified: Secondary | ICD-10-CM | POA: Diagnosis not present

## 2017-08-07 DIAGNOSIS — Z3162 Encounter for fertility preservation counseling: Secondary | ICD-10-CM | POA: Diagnosis not present

## 2017-08-07 DIAGNOSIS — E288 Other ovarian dysfunction: Secondary | ICD-10-CM | POA: Diagnosis not present

## 2017-08-07 MED FILL — ESTRADIOL 0.1 MG PATCH: 0.1 | 28 days supply | Qty: 8 | Fill #0

## 2017-08-08 ENCOUNTER — Encounter (INDEPENDENT_AMBULATORY_CARE_PROVIDER_SITE_OTHER): Payer: 59

## 2017-08-08 MED FILL — BD 3 ML SYRINGE 18GX1-1/2": 18G X 1-1/2 | 30 days supply | Qty: 30 | Fill #0

## 2017-08-08 MED FILL — BD 3 ML SYRINGE 18GX1-1/2: 18G X 1-1/2 | 30 days supply | Qty: 30 | Fill #0

## 2017-08-08 MED FILL — DOXYCYCLINE HYCLATE 100 MG: 100 | 10 days supply | Qty: 20 | Fill #0

## 2017-08-08 MED FILL — BD NEEDLES 30GX0.5": 30G X 1/2" | 30 days supply | Qty: 30 | Fill #0

## 2017-08-08 MED FILL — BD NEEDLES 30GX0.5: 30G X 1/2" | 30 days supply | Qty: 30 | Fill #0

## 2017-08-13 ENCOUNTER — Encounter: Payer: Self-pay | Admitting: Gastroenterology

## 2017-08-15 ENCOUNTER — Ambulatory Visit: Payer: 59 | Admitting: Gastroenterology

## 2017-08-15 ENCOUNTER — Encounter: Payer: Self-pay | Admitting: Gastroenterology

## 2017-08-15 VITALS — BP 150/88 | HR 80 | Ht 62.0 in | Wt 255.2 lb

## 2017-08-15 DIAGNOSIS — K625 Hemorrhage of anus and rectum: Secondary | ICD-10-CM | POA: Diagnosis not present

## 2017-08-15 DIAGNOSIS — K5909 Other constipation: Secondary | ICD-10-CM

## 2017-08-15 DIAGNOSIS — K6289 Other specified diseases of anus and rectum: Secondary | ICD-10-CM

## 2017-08-15 DIAGNOSIS — K645 Perianal venous thrombosis: Secondary | ICD-10-CM

## 2017-08-15 MED ORDER — HYDROCORTISONE ACETATE 25 MG RE SUPP: 25.0000 mg | Freq: Two times a day (BID) | RECTAL | 1 refills | Status: DC

## 2017-08-15 MED ORDER — LUBIPROSTONE 24 MCG PO CAPS: 24.0000 ug | ORAL_CAPSULE | Freq: Two times a day (BID) | ORAL | 3 refills | Status: DC

## 2017-08-15 MED FILL — HYDROCORTISONE ACETATE 25 M: 25 | 12 days supply | Qty: 24 | Fill #0

## 2017-08-15 NOTE — Progress Notes (Signed)
Christine Best    [id number]    07-28-1975  Primary Care Physician:Stallings, Arlie Solomons, MD  Referring Physician: Forrest Moron, MD [place], [name]  Chief complaint: Rectal bleeding and discomfort  HPI:  42 year old female with history of diabetes, obesity, hypertension, chronic constipation, last seen in office 07/02/2017 for complaints of rectal bleeding and discomfort, noted to have anal fissure.  Patient was advised to use rectal nitroglycerin, Benefiber and MiraLAX to improve symptoms. She was doing somewhat better with using rectal nitroglycerin but continues to have significant rectal discomfort.  She has been constipated, straining excessively and has noted protrusion of hemorrhoids that she had to manually reduce.  Has significant discomfort in the rectum and also continues to have small amount of bright red blood per rectum when she wipes but no longer bleeding in the toilet bowl like before.  Denies any nausea, vomiting, dysphagia, abdominal pain or dark stool.   History of fibroids Outpatient Encounter Medications as of 08/15/2017  Medication Sig  . AMBULATORY NON FORMULARY MEDICATION Medication Name: 0.125% nitroglycerin 1 pea sized amount per rectum 2-3 times daily  . lisinopril (PRINIVIL,ZESTRIL) 10 MG tablet TAKE 1 TABLET BY MOUTH DAILY.  . metFORMIN (GLUCOPHAGE-XR) 750 MG 24 hr tablet TAKE 1 TABLET BY MOUTH ONCE DAILY WITH BREAKFAST   No facility-administered encounter medications on file as of 08/15/2017.     Allergies as of 08/15/2017  . (No Known Allergies)    Past Medical History:  Diagnosis Date  . Anemia   . DM (diabetes mellitus) ([place])   . HTN (hypertension)     Past Surgical History:  Procedure Laterality Date  . MYOMECTOMY      Family History  Problem Relation Age of Onset  . Diabetes Mother   . Hypertension Mother   . Cancer Father   . Diabetes Brother   . Cancer Maternal Aunt        type unknown    Social  History   Socioeconomic History  . Marital status: Single    Spouse name: Not on file  . Number of children: 0  . Years of education: Not on file  . Highest education level: Not on file  Occupational History  . Occupation: Nurse  Social Needs  . Financial resource strain: Not on file  . Food insecurity:    Worry: Not on file    Inability: Not on file  . Transportation needs:    Medical: Not on file    Non-medical: Not on file  Tobacco Use  . Smoking status: Never Smoker  . Smokeless tobacco: Never Used  Substance and Sexual Activity  . Alcohol use: Yes    Alcohol/week: 0.0 oz    Comment: occasional  . Drug use: No  . Sexual activity: Yes    Birth control/protection: None  Lifestyle  . Physical activity:    Days per week: Not on file    Minutes per session: Not on file  . Stress: Not on file  Relationships  . Social connections:    Talks on phone: Not on file    Gets together: Not on file    Attends religious service: Not on file    Active member of club or organization: Not on file    Attends meetings of clubs or organizations: Not on file    Relationship status: Not on file  . Intimate partner violence:    Fear of current or  ex partner: Not on file    Emotionally abused: Not on file    Physically abused: Not on file    Forced sexual activity: Not on file  Other Topics Concern  . Not on file  Social History Narrative  . Not on file      Review of systems: Review of Systems  Constitutional: Negative for fever and chills.  HENT: Negative.   Eyes: Negative for blurred vision.  Respiratory: Negative for cough, shortness of breath and wheezing.   Cardiovascular: Negative for chest pain and palpitations.  Gastrointestinal: as per HPI Genitourinary: Negative for dysuria, urgency, frequency and hematuria.  Musculoskeletal: Negative for myalgias, back pain and joint pain.  Skin: Negative for itching and rash.  Neurological: Negative for dizziness, tremors,  focal weakness, seizures and loss of consciousness.  Endo/Heme/Allergies: Positive for seasonal allergies.  Psychiatric/Behavioral: Negative for depression, suicidal ideas and hallucinations.  All other systems reviewed and are negative.   Physical Exam: Vitals:   08/15/17 0838  BP: (!) 150/88  Pulse: 80   Body mass index is 46.69 kg/m. Gen:      No acute distress HEENT:  EOMI, sclera anicteric Neck:     No masses; no thyromegaly Lungs:    Clear to auscultation bilaterally; normal respiratory effort CV:         Regular rate and rhythm; no murmurs Abd:      + bowel sounds; soft, non-tender; no palpable masses, no distension Ext:    No edema; adequate peripheral perfusion Skin:      Warm and dry; no rash Neuro: alert and oriented x 3 Psych: normal mood and affect  Data Reviewed:  Reviewed labs, radiology imaging, old records and pertinent past GI work up   Assessment and Plan/Recommendations:  42 year old female with worsening constipation, rectal pain, hemorrhoids and intermittent bright red blood per rectum. Patient has thrombosed prolapsed internal hemorrhoid, reduced it during rectal exam Anusol suppository 1-2 times daily as needed. Sitz bath twice daily Benefiber 1 tablespoon 3 times daily with meals  Start Amitiza 24 mcg twice daily to improve constipation Increase fluid intake to 8 to 10 cups of water daily  Advised patient to avoid excessive straining  Follow-up in 2 to 3 weeks or sooner if needed  25 minutes was spent face-to-face with the patient. Greater than 50% of the time used for counseling as well as treatment plan and follow-up. She had multiple questions which were answered to her satisfaction  K. Denzil Magnuson , MD [phone]    CC: Forrest Moron, MD

## 2017-08-15 NOTE — Patient Instructions (Signed)
Do Sitz baths twice daily  We will send Anusol suppositories to your pharmacy  Take Benefiber three times a day  We will send Amitiza to your pharmacy  Keep scheduled appointment with Wallace Going NP  If you are age 40 or older, your body mass index should be between 23-30. Your Body mass index is 46.69 kg/m. If this is out of the aforementioned range listed, please consider follow up with your Primary Care Provider.  If you are age 69 or younger, your body mass index should be between 19-25. Your Body mass index is 46.69 kg/m. If this is out of the aformentioned range listed, please consider follow up with your Primary Care Provider.

## 2017-08-18 ENCOUNTER — Encounter: Payer: Self-pay | Admitting: Gastroenterology

## 2017-08-19 DIAGNOSIS — Z113 Encounter for screening for infections with a predominantly sexual mode of transmission: Secondary | ICD-10-CM | POA: Diagnosis not present

## 2017-08-19 DIAGNOSIS — Z319 Encounter for procreative management, unspecified: Secondary | ICD-10-CM | POA: Diagnosis not present

## 2017-08-19 DIAGNOSIS — D251 Intramural leiomyoma of uterus: Secondary | ICD-10-CM | POA: Diagnosis not present

## 2017-08-19 DIAGNOSIS — E288 Other ovarian dysfunction: Secondary | ICD-10-CM | POA: Diagnosis not present

## 2017-08-20 ENCOUNTER — Encounter: Payer: Self-pay | Admitting: Gastroenterology

## 2017-08-20 ENCOUNTER — Ambulatory Visit: Payer: 59 | Admitting: Nurse Practitioner

## 2017-08-23 ENCOUNTER — Other Ambulatory Visit: Payer: Self-pay

## 2017-08-23 MED ORDER — LINACLOTIDE 145 MCG PO CAPS: 145.0000 ug | ORAL_CAPSULE | Freq: Every day | ORAL | 3 refills | Status: DC

## 2017-08-23 MED FILL — LINZESS 145 MCG CAPSULE: 145 | 30 days supply | Qty: 30 | Fill #0

## 2017-08-26 ENCOUNTER — Ambulatory Visit: Payer: 59 | Admitting: Registered"

## 2017-08-29 MED FILL — AMITIZA 24 MCG CAPSULES: 24 | 30 days supply | Qty: 60 | Fill #0

## 2017-09-03 ENCOUNTER — Ambulatory Visit (INDEPENDENT_AMBULATORY_CARE_PROVIDER_SITE_OTHER): Payer: 59 | Admitting: Family Medicine

## 2017-09-03 ENCOUNTER — Encounter (INDEPENDENT_AMBULATORY_CARE_PROVIDER_SITE_OTHER): Payer: Self-pay | Admitting: Family Medicine

## 2017-09-03 VITALS — BP 133/87 | HR 84 | Temp 98.0°F | Ht 62.0 in | Wt 255.0 lb

## 2017-09-03 DIAGNOSIS — Z0289 Encounter for other administrative examinations: Secondary | ICD-10-CM

## 2017-09-03 DIAGNOSIS — R0602 Shortness of breath: Secondary | ICD-10-CM

## 2017-09-03 DIAGNOSIS — I10 Essential (primary) hypertension: Secondary | ICD-10-CM | POA: Diagnosis not present

## 2017-09-03 DIAGNOSIS — Z9189 Other specified personal risk factors, not elsewhere classified: Secondary | ICD-10-CM | POA: Diagnosis not present

## 2017-09-03 DIAGNOSIS — E119 Type 2 diabetes mellitus without complications: Secondary | ICD-10-CM | POA: Diagnosis not present

## 2017-09-03 DIAGNOSIS — R5383 Other fatigue: Secondary | ICD-10-CM

## 2017-09-03 DIAGNOSIS — Z6841 Body Mass Index (BMI) 40.0 and over, adult: Secondary | ICD-10-CM | POA: Diagnosis not present

## 2017-09-03 DIAGNOSIS — Z1331 Encounter for screening for depression: Secondary | ICD-10-CM | POA: Diagnosis not present

## 2017-09-03 HISTORY — DX: Shortness of breath: R06.02

## 2017-09-03 NOTE — Progress Notes (Signed)
.  Office: [phone]  /  Fax: [phone]   HPI:   Chief Complaint: OBESITY  Christine Best (MR# [id number]) is a 42 y.o. female who presents on 09/03/2017 for obesity evaluation and treatment. Current BMI is Body mass index is 46.64 kg/m.Christine Best Christine Best has struggled with obesity for years and has been unsuccessful in either losing weight or maintaining long term weight loss. Christine Best attended our information session and states she is currently in the action stage of change and ready to dedicate time achieving and maintaining a healthier weight.  Christine Best states her desired weight loss is to be under 200 lbs she has been heavy most of  her life she started gaining weight in her mid 20's her heaviest weight ever was 258 lbs. she has significant food cravings issues  she is frequently drinking liquids with calories she struggles with emotional eating    Fatigue Christine Best feels her energy is lower than it should be. This has worsened with weight gain and has not worsened recently. Christine Best admits to daytime somnolence and denies waking up still tired. Patient is at risk for obstructive sleep apnea. Patent has a history of symptoms of daytime fatigue and hypertension. Patient generally gets 8 or 9 hours of sleep per night, and states they generally have restful sleep. Snoring is present. Apneic episodes are not present. Epworth Sleepiness Score is 8  Dyspnea on exertion Christine Best notes increasing shortness of breath with exercising and seems to be worsening over time with weight gain. She notes getting out of breath sooner with activity than she used to. This has not gotten worse recently. Christine Best denies orthopnea.  Diabetes II Christine Best has a diagnosis of diabetes type II. Christine Best does not have blood sugar log today. She is on metformin and denies any hypoglycemic episodes, nausea or vomiting. Last A1c was at 6.4 She is interested in controlling her diabetes with intensive lifestyle modifications including diet,  exercise, and weight loss.  Hypertension Christine Best is a 42 y.o. female with hypertension. She is on Lisinopril currently. Christine Best denies chest pain. She is attempting to work on weight loss to help control her blood pressure with the goal of decreasing her risk of heart attack and stroke. Christine Best blood pressure is normal today.  At risk for cardiovascular disease Christine Best is at a higher than average risk for cardiovascular disease due to obesity, diabetes and hypertension. She currently denies any chest pain.  Depression Screen Christine Best's Food and Mood (modified PHQ-9) score was  Depression screen PHQ 2/9 09/03/2017  Decreased Interest 0  Down, Depressed, Hopeless 0  PHQ - 2 Score 0  Altered sleeping 1  Tired, decreased energy 1  Change in appetite 0  Feeling bad or failure about yourself  0  Trouble concentrating 0  Moving slowly or fidgety/restless 0  Suicidal thoughts 0  PHQ-9 Score 2  Difficult doing work/chores Not difficult at all    ALLERGIES: No Known Allergies  MEDICATIONS: Current Outpatient Medications on File Prior to Visit  Medication Sig Dispense Refill  . lisinopril (PRINIVIL,ZESTRIL) 10 MG tablet TAKE 1 TABLET BY MOUTH DAILY. 90 tablet 2  . metFORMIN (GLUCOPHAGE-XR) 750 MG 24 hr tablet TAKE 1 TABLET BY MOUTH ONCE DAILY WITH BREAKFAST 90 tablet 1  . Prenatal Vit-Fe Fumarate-FA (PRENATAL MULTIVITAMIN) TABS tablet Take 1 tablet by mouth daily at 12 noon.     No current facility-administered medications on file prior to visit.     PAST MEDICAL HISTORY: Past Medical History:  Diagnosis Date  . Anemia   . Constipation   . DM (diabetes mellitus) ([place])   . HTN (hypertension)   . Infertility, female     PAST SURGICAL HISTORY: Past Surgical History:  Procedure Laterality Date  . MYOMECTOMY      SOCIAL HISTORY: Social History   Tobacco Use  . Smoking status: Never Smoker  . Smokeless tobacco: Never Used  Substance Use Topics  . Alcohol use: Yes     Alcohol/week: 0.0 oz    Comment: occasional  . Drug use: No    FAMILY HISTORY: Family History  Problem Relation Age of Onset  . Diabetes Mother   . Hypertension Mother   . Cancer Father   . Diabetes Brother   . Cancer Maternal Aunt        type unknown    ROS: Review of Systems  Constitutional: Positive for malaise/fatigue.  Respiratory: Positive for shortness of breath (on exertion).   Cardiovascular: Negative for chest pain and orthopnea.  Gastrointestinal: Positive for constipation. Negative for nausea and vomiting.       Positive for rectal bleeding (anal fissure)  Endo/Heme/Allergies:       Negative for hypoglycemia    PHYSICAL EXAM: Blood pressure 133/87, pulse 84, temperature 98 F (36.7 C), temperature source Oral, height 5\' 2"  (1.575 m), weight 255 lb (115.7 kg), last menstrual period 08/08/2017, SpO2 100 %. Body mass index is 46.64 kg/m. Physical Exam  Constitutional: She is oriented to person, place, and time. She appears well-developed and well-nourished.  HENT:  Head: Normocephalic and atraumatic.  Nose: Nose normal.  Eyes: EOM are normal. No scleral icterus.  Neck: Normal range of motion. Neck supple. No thyromegaly present.  Cardiovascular: Normal rate and regular rhythm.  Pulmonary/Chest: Effort normal. No respiratory distress.  Abdominal: Soft. There is no tenderness.  + obesity  Musculoskeletal: Normal range of motion.  Range of Motion normal in all 4 extremities  Neurological: She is alert and oriented to person, place, and time. Coordination normal.  Skin: Skin is warm and dry.  Psychiatric: She has a normal mood and affect. Her behavior is normal.  Vitals reviewed.   RECENT LABS AND TESTS: BMET    Component Value Date/Time   NA 138 04/03/2017 1003   K 4.3 04/03/2017 1003   CL 102 04/03/2017 1003   CO2 20 04/03/2017 1003   GLUCOSE 105 (H) 04/03/2017 1003   GLUCOSE 138 (H) 06/30/2014 1536   BUN 6 04/03/2017 1003   CREATININE 0.73  04/03/2017 1003   CREATININE 0.85 06/30/2014 1536   CALCIUM 8.9 04/03/2017 1003   GFRNONAA 104 04/03/2017 1003   GFRAA 120 04/03/2017 1003   Lab Results  Component Value Date   HGBA1C 6.4 (A) 07/26/2017   No results found for: INSULIN CBC    Component Value Date/Time   WBC 8.6 06/30/2014 1536   RBC 4.86 06/30/2014 1536   HGB 10.0 (L) 06/30/2014 1536   HCT 34.6 (L) 06/30/2014 1536   PLT 436 (H) 06/30/2014 1536   MCV 71.2 (L) 06/30/2014 1536   MCH 20.6 (L) 06/30/2014 1536   MCHC 28.9 (L) 06/30/2014 1536   RDW 18.2 (H) 06/30/2014 1536   LYMPHSABS 1.1 03/03/2009 1944   MONOABS 0.4 03/03/2009 1944   EOSABS 0.1 03/03/2009 1944   BASOSABS 0.0 03/03/2009 1944   Iron/TIBC/Ferritin/ %Sat No results found for: IRON, TIBC, FERRITIN, IRONPCTSAT Lipid Panel     Component Value Date/Time   CHOL 168 04/03/2017 1003   TRIG 45  04/03/2017 1003   HDL 52 04/03/2017 1003   CHOLHDL 3.2 04/03/2017 1003   LDLCALC 107 (H) 04/03/2017 1003   Hepatic Function Panel     Component Value Date/Time   PROT 7.0 04/03/2017 1003   ALBUMIN 4.1 04/03/2017 1003   AST 11 04/03/2017 1003   ALT 10 04/03/2017 1003   ALKPHOS 80 04/03/2017 1003   BILITOT 0.3 04/03/2017 1003      Component Value Date/Time   TSH 0.956 06/30/2014 1536   Vitamin D There are no recent labs results  ECG  shows NSR with a rate of 81 BPM INDIRECT CALORIMETER done today shows a VO2 of 296 and a REE of 2058. Her calculated basal metabolic rate is 3244 thus her basal metabolic rate is better than expected.    ASSESSMENT AND PLAN: Other fatigue - Plan: EKG 12-Lead, Vitamin B12, CBC With Differential, Lipid Panel With LDL/HDL Ratio, T3, T4, free, TSH, VITAMIN D 25 Hydroxy (Vit-D Deficiency, Fractures)  Shortness of breath on exertion - Plan: CBC With Differential  Type 2 diabetes mellitus without complication, without long-term current use of insulin (HCC) - Plan: Hemoglobin A1c, Insulin, random, Comprehensive metabolic  panel  Essential hypertension  Depression screening  At risk for heart disease  Class 3 severe obesity with serious comorbidity and body mass index (BMI) of 45.0 to 49.9 in adult, unspecified obesity type (HCC)  PLAN:  Fatigue Kameko was informed that her fatigue may be related to obesity, depression or many other causes. Labs will be ordered, and in the meanwhile Ottie has agreed to work on diet, exercise and weight loss to help with fatigue. Proper sleep hygiene was discussed including the need for 7-8 hours of quality sleep each night. A sleep study was not ordered based on symptoms and Epworth score.  Dyspnea on exertion Ebonee's shortness of breath appears to be obesity related and exercise induced. She has agreed to work on weight loss and gradually increase exercise to treat her exercise induced shortness of breath. If Maricsa follows our instructions and loses weight without improvement of her shortness of breath, we will plan to refer to pulmonology. We will monitor this condition regularly. Symia agrees to this plan.  Diabetes II Vernella has been given extensive diabetes education by myself today including ideal fasting and post-prandial blood glucose readings, individual ideal Hgb A1c goals and hypoglycemia prevention. We discussed the importance of good blood sugar control to decrease the likelihood of diabetic complications such as nephropathy, neuropathy, limb loss, blindness, coronary artery disease, and death. We discussed the importance of intensive lifestyle modification including diet, exercise and weight loss as the first line treatment for diabetes. We will check labs and Cicily agrees to continue her diabetes medications and will follow up at the agreed upon time.  Hypertension We discussed sodium restriction, working on healthy weight loss, and a regular exercise program as the means to achieve improved blood pressure control. Celestial agreed with this plan and agreed to  follow up as directed. We will check labs and will continue to monitor her blood pressure as well as her progress with the above lifestyle modifications. She will continue her medications as prescribed and will watch for signs of hypotension as she continues her lifestyle modifications.  Cardiovascular risk counseling Garnette was given extended (15 minutes) coronary artery disease prevention counseling today. She is 42 y.o. female and has risk factors for heart disease including obesity, diabetes and hypertension. We discussed intensive lifestyle modifications today with an emphasis on specific  weight loss instructions and strategies. Pt was also informed of the importance of increasing exercise and decreasing saturated fats to help prevent heart disease.  Depression Screen Kehinde had a negative depression screening. Depression is commonly associated with obesity and often results in emotional eating behaviors. We will monitor this closely and work on CBT to help improve the non-hunger eating patterns. Referral to Psychology may be required if no improvement is seen as she continues in our clinic.  Obesity Aiesha is currently in the action stage of change and her goal is to continue with weight loss efforts She has agreed to follow the Category 3 plan Lucille has been instructed to work up to a goal of 150 minutes of combined cardio and strengthening exercise per week for weight loss and overall health benefits. We discussed the following Behavioral Modification Strategies today: increasing lean protein intake, decreasing simple carbohydrates  and work on meal planning and easy cooking plans  Claressa has agreed to follow up with our clinic in 2 weeks. She was informed of the importance of frequent follow up visits to maximize her success with intensive lifestyle modifications for her multiple health conditions. She was informed we would discuss her lab results at her next visit unless there is a critical  issue that needs to be addressed sooner. Ahriana agreed to keep her next visit at the agreed upon time to discuss these results.    OBESITY BEHAVIORAL INTERVENTION VISIT  Today's visit was # 1 out of 22.  Starting weight: 255 lbs Starting date: 09/03/17 Today's weight : 255 lbs  Today's date: 09/03/2017 Total lbs lost to date: 0    ASK: We discussed the diagnosis of obesity with Christine Best today and Chairty agreed to give [place] permission to discuss obesity behavioral modification therapy today.  ASSESS: [place] has the diagnosis of obesity and her BMI today is 46.63 Yuval is in the action stage of change   ADVISE: Reiley was educated on the multiple health risks of obesity as well as the benefit of weight loss to improve her health. She was advised of the need for long term treatment and the importance of lifestyle modifications.  AGREE: Multiple dietary modification options and treatment options were discussed and  Aishwarya agreed to the above obesity treatment plan.   I, Doreene Nest, am acting as transcriptionist for Dennard Nip, MD   I have reviewed the above documentation for accuracy and completeness, and I agree with the above. -Dennard Nip, MD

## 2017-09-04 LAB — COMPREHENSIVE METABOLIC PANEL
A/G RATIO: 1.3 (ref 1.2–2.2)
ALBUMIN: 3.9 g/dL (ref 3.5–5.5)
ALT: 10 IU/L (ref 0–32)
AST: 14 IU/L (ref 0–40)
Alkaline Phosphatase: 80 IU/L (ref 39–117)
BILIRUBIN TOTAL: 0.3 mg/dL (ref 0.0–1.2)
BUN / CREAT RATIO: 10 (ref 9–23)
BUN: 7 mg/dL (ref 6–20)
CHLORIDE: 106 mmol/L (ref 96–106)
CO2: 20 mmol/L (ref 20–29)
Calcium: 8.7 mg/dL (ref 8.7–10.2)
Creatinine, Ser: 0.7 mg/dL (ref 0.57–1.00)
GFR calc non Af Amer: 109 mL/min/{1.73_m2} (ref >59)
GFR, EST AFRICAN AMERICAN: 126 mL/min/{1.73_m2} (ref >59)
Globulin, Total: 2.9 g/dL (ref 1.5–4.5)
Glucose: 94 mg/dL (ref 65–99)
POTASSIUM: 4.4 mmol/L (ref 3.5–5.2)
SODIUM: 142 mmol/L (ref 134–144)
TOTAL PROTEIN: 6.8 g/dL (ref 6.0–8.5)

## 2017-09-04 LAB — CBC WITH DIFFERENTIAL
BASOS: 0 %
Basophils Absolute: 0 10*3/uL (ref 0.0–0.2)
EOS (ABSOLUTE): 0.1 10*3/uL (ref 0.0–0.4)
EOS: 1 %
HEMATOCRIT: 30.1 % — AB (ref 34.0–46.6)
Hemoglobin: 8.1 g/dL — ABNORMAL LOW (ref 11.1–15.9)
Immature Grans (Abs): 0 10*3/uL (ref 0.0–0.1)
Immature Granulocytes: 0 %
LYMPHS ABS: 1.7 10*3/uL (ref 0.7–3.1)
Lymphs: 32 %
MCH: 18.8 pg — ABNORMAL LOW (ref 26.6–33.0)
MCHC: 26.9 g/dL — AB (ref 31.5–35.7)
MCV: 70 fL — AB (ref 79–97)
MONOS ABS: 0.3 10*3/uL (ref 0.1–0.9)
Monocytes: 6 %
NEUTROS PCT: 61 %
Neutrophils Absolute: 3.2 10*3/uL (ref 1.4–7.0)
RBC: 4.3 x10E6/uL (ref 3.77–5.28)
RDW: 20.4 % — AB (ref 12.3–15.4)
WBC: 5.2 10*3/uL (ref 3.4–10.8)

## 2017-09-04 LAB — VITAMIN D 25 HYDROXY (VIT D DEFICIENCY, FRACTURES): Vit D, 25-Hydroxy: 16.9 ng/mL — ABNORMAL LOW (ref 30.0–100.0)

## 2017-09-04 LAB — T4, FREE: Free T4: 0.89 ng/dL (ref 0.82–1.77)

## 2017-09-04 LAB — T3: T3 TOTAL: 99 ng/dL (ref 71–180)

## 2017-09-04 LAB — LIPID PANEL WITH LDL/HDL RATIO
Cholesterol, Total: 161 mg/dL (ref 100–199)
HDL: 53 mg/dL (ref >39)
LDL Calculated: 97 mg/dL (ref 0–99)
LDL/HDL RATIO: 1.8 ratio (ref 0.0–3.2)
Triglycerides: 55 mg/dL (ref 0–149)
VLDL CHOLESTEROL CAL: 11 mg/dL (ref 5–40)

## 2017-09-04 LAB — HEMOGLOBIN A1C
Est. average glucose Bld gHb Est-mCnc: 131 mg/dL
Hgb A1c MFr Bld: 6.2 % — ABNORMAL HIGH (ref 4.8–5.6)

## 2017-09-04 LAB — VITAMIN B12: VITAMIN B 12: 604 pg/mL (ref 232–1245)

## 2017-09-04 LAB — TSH: TSH: 1.53 u[IU]/mL (ref 0.450–4.500)

## 2017-09-04 LAB — INSULIN, RANDOM: INSULIN: 5.6 u[IU]/mL (ref 2.6–24.9)

## 2017-09-10 ENCOUNTER — Ambulatory Visit: Payer: Self-pay | Admitting: Gastroenterology

## 2017-09-16 ENCOUNTER — Ambulatory Visit: Payer: 59 | Admitting: Nurse Practitioner

## 2017-09-17 ENCOUNTER — Ambulatory Visit (INDEPENDENT_AMBULATORY_CARE_PROVIDER_SITE_OTHER): Payer: Self-pay | Admitting: Family Medicine

## 2017-09-18 ENCOUNTER — Ambulatory Visit (INDEPENDENT_AMBULATORY_CARE_PROVIDER_SITE_OTHER): Payer: 59 | Admitting: Family Medicine

## 2017-09-18 VITALS — BP 119/76 | HR 84 | Temp 98.0°F | Ht 62.0 in | Wt 252.0 lb

## 2017-09-18 DIAGNOSIS — E559 Vitamin D deficiency, unspecified: Secondary | ICD-10-CM

## 2017-09-18 DIAGNOSIS — Z6841 Body Mass Index (BMI) 40.0 and over, adult: Secondary | ICD-10-CM | POA: Diagnosis not present

## 2017-09-18 DIAGNOSIS — E119 Type 2 diabetes mellitus without complications: Secondary | ICD-10-CM

## 2017-09-18 DIAGNOSIS — Z9189 Other specified personal risk factors, not elsewhere classified: Secondary | ICD-10-CM

## 2017-09-18 DIAGNOSIS — D509 Iron deficiency anemia, unspecified: Secondary | ICD-10-CM | POA: Diagnosis not present

## 2017-09-18 MED ORDER — VITAMIN D (ERGOCALCIFEROL) 1.25 MG (50000 UNIT) PO CAPS: 50000.0000 [IU] | ORAL_CAPSULE | ORAL | 0 refills | Status: DC

## 2017-09-18 MED FILL — VIT D2 1.25 MG (50,000 UNIT: 1.25 MG | 28 days supply | Qty: 4 | Fill #0

## 2017-09-19 NOTE — Progress Notes (Signed)
Office: [phone]  /  Fax: [phone]   HPI:   Chief Complaint: OBESITY Christine Best is here to discuss her progress with her obesity treatment plan. She is on the  follow the Category 3 plan and is following her eating plan approximately 80 % of the time. She states she is exercising 0 minutes 0 times per week. Christine Best didn't like yogurt or milk, but she did okay with her protein. She is discouraged, she didn't lose more weight, even though, she met our goals very nicely. She weighed herself daily, which caused her to feel discouraged. Her weight is 252 lb (114.3 kg) today and has had a weight loss of 3 pounds over a period of 2 weeks since her last visit. She has lost 3 lbs since starting treatment with [place].  Vitamin D deficiency (new) Christine Best has a new diagnosis of vitamin D deficiency. She is currently on multi vitamin, but her level is still low. She admits fatigue and denies nausea, vomiting or muscle weakness.  Microcytic Anemia Christine Best is on multi vitamin with iron and is scheduled for a repeat myomectomy later this year. She admits fatigue and denies palpitations or lightheadedness.  Diabetes II Christine Best has a diagnosis of diabetes type II. Christine Best did not bring her blood sugar log today and denies any hypoglycemic episodes, nausea or vomiting. Her A1c is well controlled on metformin. She did well with her diet prescription. She has been working on intensive lifestyle modifications including diet, exercise, and weight loss to help control her blood glucose levels.  At risk for cardiovascular disease Christine Best is at a higher than average risk for cardiovascular disease due to obesity and diabetes. She currently denies any chest pain.  ALLERGIES: No Known Allergies  MEDICATIONS: Current Outpatient Medications on File Prior to Visit  Medication Sig Dispense Refill  . lisinopril (PRINIVIL,ZESTRIL) 10 MG tablet TAKE 1 TABLET BY MOUTH DAILY. 90 tablet 2  . metFORMIN (GLUCOPHAGE-XR) 750 MG 24  hr tablet TAKE 1 TABLET BY MOUTH ONCE DAILY WITH BREAKFAST 90 tablet 1  . Prenatal Vit-Fe Fumarate-FA (PRENATAL MULTIVITAMIN) TABS tablet Take 1 tablet by mouth daily at 12 noon.     No current facility-administered medications on file prior to visit.     PAST MEDICAL HISTORY: Past Medical History:  Diagnosis Date  . Anemia   . Constipation   . DM (diabetes mellitus) ([place])   . HTN (hypertension)   . Infertility, female     PAST SURGICAL HISTORY: Past Surgical History:  Procedure Laterality Date  . MYOMECTOMY      SOCIAL HISTORY: Social History   Tobacco Use  . Smoking status: Never Smoker  . Smokeless tobacco: Never Used  Substance Use Topics  . Alcohol use: Yes    Alcohol/week: 0.0 oz    Comment: occasional  . Drug use: No    FAMILY HISTORY: Family History  Problem Relation Age of Onset  . Diabetes Mother   . Hypertension Mother   . Cancer Father   . Diabetes Brother   . Cancer Maternal Aunt        type unknown    ROS: Review of Systems  Constitutional: Positive for malaise/fatigue and weight loss.  Cardiovascular: Negative for chest pain and palpitations.  Gastrointestinal: Negative for nausea and vomiting.  Musculoskeletal:       Negative for muscle weakness  Endo/Heme/Allergies:       Negative for lightheadedness Negative for hypoglycemia    PHYSICAL EXAM: Blood pressure 119/76, pulse 84, temperature 98  F (36.7 C), temperature source Oral, height 5' 2"  (1.575 m), weight 252 lb (114.3 kg), last menstrual period 09/02/2017, SpO2 100 %. Body mass index is 46.09 kg/m. Physical Exam  Constitutional: She is oriented to person, place, and time. She appears well-developed and well-nourished.  Cardiovascular: Normal rate.  Pulmonary/Chest: Effort normal.  Musculoskeletal: Normal range of motion.  Neurological: She is oriented to person, place, and time.  Skin: Skin is warm and dry.  Psychiatric: She has a normal mood and affect. Her behavior is  normal.  Vitals reviewed.   RECENT LABS AND TESTS: BMET    Component Value Date/Time   NA 142 09/03/2017 1051   K 4.4 09/03/2017 1051   CL 106 09/03/2017 1051   CO2 20 09/03/2017 1051   GLUCOSE 94 09/03/2017 1051   GLUCOSE 138 (H) 06/30/2014 1536   BUN 7 09/03/2017 1051   CREATININE 0.70 09/03/2017 1051   CREATININE 0.85 06/30/2014 1536   CALCIUM 8.7 09/03/2017 1051   GFRNONAA 109 09/03/2017 1051   GFRAA 126 09/03/2017 1051   Lab Results  Component Value Date   HGBA1C 6.2 (H) 09/03/2017   HGBA1C 6.4 (A) 07/26/2017   HGBA1C 6.1 04/03/2017   HGBA1C 7 01/14/2017   HGBA1C 6.5 (H) 10/09/2016   Lab Results  Component Value Date   INSULIN 5.6 09/03/2017   CBC    Component Value Date/Time   WBC 5.2 09/03/2017 1051   WBC 8.6 06/30/2014 1536   RBC 4.30 09/03/2017 1051   RBC 4.86 06/30/2014 1536   HGB 8.1 (L) 09/03/2017 1051   HCT 30.1 (L) 09/03/2017 1051   PLT 436 (H) 06/30/2014 1536   MCV 70 (L) 09/03/2017 1051   MCH 18.8 (L) 09/03/2017 1051   MCH 20.6 (L) 06/30/2014 1536   MCHC 26.9 (L) 09/03/2017 1051   MCHC 28.9 (L) 06/30/2014 1536   RDW 20.4 (H) 09/03/2017 1051   LYMPHSABS 1.7 09/03/2017 1051   MONOABS 0.4 03/03/2009 1944   EOSABS 0.1 09/03/2017 1051   BASOSABS 0.0 09/03/2017 1051   Iron/TIBC/Ferritin/ %Sat No results found for: IRON, TIBC, FERRITIN, IRONPCTSAT Lipid Panel     Component Value Date/Time   CHOL 161 09/03/2017 1051   TRIG 55 09/03/2017 1051   HDL 53 09/03/2017 1051   CHOLHDL 3.2 04/03/2017 1003   LDLCALC 97 09/03/2017 1051   Hepatic Function Panel     Component Value Date/Time   PROT 6.8 09/03/2017 1051   ALBUMIN 3.9 09/03/2017 1051   AST 14 09/03/2017 1051   ALT 10 09/03/2017 1051   ALKPHOS 80 09/03/2017 1051   BILITOT 0.3 09/03/2017 1051      Component Value Date/Time   TSH 1.530 09/03/2017 1051   TSH 0.956 06/30/2014 1536   Results for Christine, Best (MRN [id number]) as of 09/19/2017 13:22  Ref. Range 09/03/2017 10:51  Vitamin  D, 25-Hydroxy Latest Ref Range: 30.0 - 100.0 ng/mL 16.9 (L)   ASSESSMENT AND PLAN: Vitamin D deficiency - Plan: Vitamin D, Ergocalciferol, (DRISDOL) 50000 units CAPS capsule  Microcytic anemia  Type 2 diabetes mellitus without complication, without long-term current use of insulin (HCC)  At risk for heart disease  Class 3 severe obesity with serious comorbidity and body mass index (BMI) of 45.0 to 49.9 in adult, unspecified obesity type ([place])  PLAN:  Vitamin D Deficiency (new) Christine Best was informed that low vitamin D levels contributes to fatigue and are associated with obesity, breast, and colon cancer. She agrees to start prescription Vit D @50 ,000 IU  every week #4 with no refills and will follow up for routine testing of vitamin D, at least 2-3 times per year. She was informed of the risk of over-replacement of vitamin D and agrees to not increase her dose unless she discusses this with [place] first. Christine Best agrees to follow up as directed.  Microcytic Anemia The diagnosis of microcytic anemia was discussed with Christine Best and was explained in detail. She was given suggestions of iron rich foods and iron supplement was not prescribed. She will continue with her iron rich diet and we will continue to monitor. Christine Best will follow up at the agreed upon time.  Diabetes II Christine Best has been given extensive diabetes education by myself today including ideal fasting and post-prandial blood glucose readings, individual ideal Hgb A1c goals and hypoglycemia prevention. We discussed the importance of good blood sugar control to decrease the likelihood of diabetic complications such as nephropathy, neuropathy, limb loss, blindness, coronary artery disease, and death. We discussed the importance of intensive lifestyle modification including diet, exercise and weight loss as the first line treatment for diabetes. We will continue to follow and Christine Best agrees to continue diet and metformin and follow up at the agreed  upon time.  Cardiovascular risk counseling Christine Best was given extended (30 minutes) coronary artery disease prevention counseling today. She is 42 y.o. female and has risk factors for heart disease including obesity and diabetes We discussed intensive lifestyle modifications today with an emphasis on specific weight loss instructions and strategies. Pt was also informed of the importance of increasing exercise and decreasing saturated fats to help prevent heart disease.  Obesity Christine Best is currently in the action stage of change. As such, her goal is to continue with weight loss efforts She has agreed to follow the Category 3 plan Christine Best has been instructed to work up to a goal of 150 minutes of combined cardio and strengthening exercise per week for weight loss and overall health benefits. We discussed the following Behavioral Modification Strategies today: increasing lean protein intake, work on meal planning and easy cooking plans and emotional eating strategies Christine Best was educated on reasonable expectations and healthy weight loss. She was ordered to not weigh herself at all in-between visits.  Christine Best has agreed to follow up with our clinic in 2 weeks. She was informed of the importance of frequent follow up visits to maximize her success with intensive lifestyle modifications for her multiple health conditions.   OBESITY BEHAVIORAL INTERVENTION VISIT  Today's visit was # 2 out of 22.  Starting weight: 255 lbs Starting date: 09/03/17 Today's weight : 252 lbs Today's date: 09/18/17 Total lbs lost to date: 3    ASK: We discussed the diagnosis of obesity with Christine Best today and Christine Best agreed to give [place] permission to discuss obesity behavioral modification therapy today.  ASSESS: Christine Best has the diagnosis of obesity and her BMI today is 46.08 Christine Best is in the action stage of change   ADVISE: Christine Best was educated on the multiple health risks of obesity as well as the benefit of  weight loss to improve her health. She was advised of the need for long term treatment and the importance of lifestyle modifications.  AGREE: Multiple dietary modification options and treatment options were discussed and  Kynlei agreed to the above obesity treatment plan.  I, Christine Best, am acting as transcriptionist for Dennard Nip, MD  I have reviewed the above documentation for accuracy and completeness, and I agree with the above. -Dennard Nip, MD

## 2017-10-02 ENCOUNTER — Ambulatory Visit (HOSPITAL_COMMUNITY)
Admission: EM | Admit: 2017-10-02 | Discharge: 2017-10-02 | Disposition: A | Discharge status: Home / Self Care | Payer: 59 | Attending: Family Medicine | Admitting: Family Medicine

## 2017-10-02 ENCOUNTER — Ambulatory Visit: Payer: Self-pay | Admitting: *Deleted

## 2017-10-02 DIAGNOSIS — T783XXA Angioneurotic edema, initial encounter: Secondary | ICD-10-CM

## 2017-10-02 MED ORDER — METHYLPREDNISOLONE SODIUM SUCC 125 MG IJ SOLR
80.0000 mg | Freq: Once | INTRAMUSCULAR | Status: AC
  Administered 2017-10-02: 80 mg via INTRAMUSCULAR

## 2017-10-02 MED ORDER — METHYLPREDNISOLONE SODIUM SUCC 125 MG IJ SOLR
INTRAMUSCULAR | Status: AC
  Filled 2017-10-02: qty 2

## 2017-10-02 NOTE — Discharge Instructions (Addendum)
I am so sorry that this has happened to you.  We gave you some solumedrol in clinic.  Continue the benadryl and you can take zyrtec also.  If you develop any trouble swallowing, breathing, chest pain or shortness of breath please go to the ER.

## 2017-10-02 NOTE — Telephone Encounter (Addendum)
Patient phoned with swollen lips she thinks may be from taking lisinopril. Reported this happened once before about a month ago, she took benadryl and the swollen lips resolved. Today about an hour after taking lisinopril she noticed her lips swelling. Denies rash/hives/swollen tongue/restricted throat nor any difficulty swallowing. No SOB reported. She has taken 2 separate doses of benadryl 50 MG today, the last at 3:15p and the swelling seems to be increasing.Recommended she go to UC for immediate evaluation. She disconnected the call immediately.   Noted patient checked in at [facility] at this time.   Reason for Disposition . Caller has URGENT medication question about med that PCP prescribed and triager unable to answer question  Answer Assessment - Initial Assessment Questions 1. SYMPTOMS: "Do you have any symptoms?"     Swollen lips she thinks it might be from taking lisinopril.  2. SEVERITY: If symptoms are present, ask "Are they mild, moderate or severe?"     severe  Protocols used: MEDICATION QUESTION CALL-A-AH

## 2017-10-02 NOTE — ED Notes (Signed)
Triaged by provider  

## 2017-10-03 ENCOUNTER — Ambulatory Visit (INDEPENDENT_AMBULATORY_CARE_PROVIDER_SITE_OTHER): Payer: Self-pay | Admitting: Family Medicine

## 2017-10-03 NOTE — ED Provider Notes (Signed)
[place]    CSN: [id number] Arrival date & time: 10/02/17  1642     History   Chief Complaint No chief complaint on file.   HPI Christine Best is a 42 y.o. female.   Pt is a 42 year old female with PMH of HTN and DM 2 taking lisinopril and metformin. She presents today with angioedema. This started at 5 am before she took her lisinopril. She then took the medication later that morning. The swelling has gotten progressively worse. She had a similar episode like this about 1 month ago. She took benadryl at that time and the swelling resolved.  She is not having any tongue swelling, trouble swallowing, trouble breathing or chest  pain. She has taken max dose of benadryl this time without any relief of swelling. She is unsure or not aware of any food allergies.   ROS per HPI      Past Medical History:  Diagnosis Date  . Anemia   . Constipation   . DM (diabetes mellitus) ([place])   . HTN (hypertension)   . Infertility, female     Patient Active Problem List   Diagnosis Date Noted  . Other fatigue 09/03/2017  . Shortness of breath on exertion 09/03/2017  . Type 2 diabetes mellitus without complication, without long-term current use of insulin ([place]) 09/03/2017  . Type 2 diabetes mellitus with complication ([place]) 19/37/9024  . Morbid obesity ([place]) 04/03/2017  . Essential hypertension 04/03/2017    Past Surgical History:  Procedure Laterality Date  . MYOMECTOMY      OB History    Gravida  0   Para  0   Term  0   Preterm  0   AB  0   Living  0     SAB  0   TAB  0   Ectopic  0   Multiple  0   Live Births               Home Medications    Prior to Admission medications   Medication Sig Start Date End Date Taking? Authorizing Provider  lisinopril (PRINIVIL,ZESTRIL) 10 MG tablet TAKE 1 TABLET BY MOUTH DAILY. 04/03/17   Forrest Moron, MD  metFORMIN (GLUCOPHAGE-XR) 750 MG 24 hr tablet TAKE 1 TABLET BY MOUTH ONCE DAILY WITH BREAKFAST  07/10/17   Forrest Moron, MD  Prenatal Vit-Fe Fumarate-FA (PRENATAL MULTIVITAMIN) TABS tablet Take 1 tablet by mouth daily at 12 noon.    [provider]  Vitamin D, Ergocalciferol, (DRISDOL) 50000 units CAPS capsule Take 1 capsule (50,000 Units total) by mouth every 7 (seven) days. 09/18/17   Starlyn Skeans, MD    Family History Family History  Problem Relation Age of Onset  . Diabetes Mother   . Hypertension Mother   . Cancer Father   . Diabetes Brother   . Cancer Maternal Aunt        type unknown    Social History Social History   Tobacco Use  . Smoking status: Never Smoker  . Smokeless tobacco: Never Used  Substance Use Topics  . Alcohol use: Yes    Alcohol/week: 0.0 standard drinks    Comment: occasional  . Drug use: No     Allergies   Patient has no known allergies.   Review of Systems Review of Systems   Physical Exam Triage Vital Signs ED Triage Vitals  Enc Vitals Group     BP 10/02/17 1703 (!) 148/96  Pulse Rate 10/02/17 1703 93     Resp 10/02/17 1703 18     Temp 10/02/17 1703 98.4 F (36.9 C)     Temp Source 10/02/17 1703 Oral     SpO2 10/02/17 1703 97 %     Weight --      Height --      Head Circumference --      Peak Flow --      Pain Score 10/02/17 1756 0     Pain Loc --      Pain Edu? --      Excl. in [place]? --    No data found.  Updated Vital Signs BP (!) 148/96 (BP Location: Left Arm)   Pulse 93   Temp 98.4 F (36.9 C) (Oral)   Resp 18   LMP 09/02/2017 (Exact Date)   SpO2 97%   Visual Acuity Right Eye Distance:   Left Eye Distance:   Bilateral Distance:    Right Eye Near:   Left Eye Near:    Bilateral Near:     Physical Exam  Constitutional: She is oriented to person, place, and time. She appears well-developed and well-nourished. No distress.  HENT:  Head: Normocephalic and atraumatic.  Significant angioedema. No tongue swelling or throat swelling.   Neck: Normal range of motion.  No neck swelling.     Pulmonary/Chest: Effort normal.  Musculoskeletal: Normal range of motion.  Neurological: She is alert and oriented to person, place, and time.  Skin: Skin is warm.  Psychiatric: She has a normal mood and affect.  Nursing note and vitals reviewed.    UC Treatments / Results  Labs (all labs ordered are listed, but only abnormal results are displayed) Labs Reviewed - No data to display  EKG None  Radiology No results found.  Procedures Procedures (including critical care time)  Medications Ordered in UC Medications  methylPREDNISolone sodium succinate (SOLU-MEDROL) 125 mg/2 mL injection 80 mg (80 mg Intramuscular Given 10/02/17 1755)    Initial Impression / Assessment and Plan / UC Course  I have reviewed the triage vital signs and the nursing notes.  Pertinent labs & imaging results that were available during my care of the patient were reviewed by me and considered in my medical decision making (see chart for details).     Angioedema- possibly from the lisinopril. Solumedrol injection given. Continue the benadryl and ad zyrtec as needed.  She has an appointment with her doctor coming up to discuss this. Instructed that if she develops any worsening symptoms that she needs to go to the ER. Pt agree.    Final Clinical Impressions(s) / UC Diagnoses   Final diagnoses:  Angioedema, initial encounter     Discharge Instructions     I am so sorry that this has happened to you.  We gave you some solumedrol in clinic.  Continue the benadryl and you can take zyrtec also.  If you develop any trouble swallowing, breathing, chest pain or shortness of breath please go to the ER.     ED Prescriptions    None     Controlled Substance Prescriptions [place] Controlled Substance Registry consulted? Not Applicable   Orvan July, NP 10/03/17 1015

## 2017-10-15 ENCOUNTER — Ambulatory Visit: Payer: 59 | Admitting: Family Medicine

## 2017-10-15 ENCOUNTER — Other Ambulatory Visit: Payer: Self-pay

## 2017-10-15 ENCOUNTER — Encounter: Payer: Self-pay | Admitting: Family Medicine

## 2017-10-15 VITALS — BP 147/91 | HR 85 | Temp 98.6°F | Resp 17 | Ht 62.0 in | Wt 262.0 lb

## 2017-10-15 DIAGNOSIS — E559 Vitamin D deficiency, unspecified: Secondary | ICD-10-CM

## 2017-10-15 DIAGNOSIS — Z23 Encounter for immunization: Secondary | ICD-10-CM

## 2017-10-15 DIAGNOSIS — I10 Essential (primary) hypertension: Secondary | ICD-10-CM | POA: Diagnosis not present

## 2017-10-15 DIAGNOSIS — E119 Type 2 diabetes mellitus without complications: Secondary | ICD-10-CM | POA: Diagnosis not present

## 2017-10-15 MED ORDER — HYDROCHLOROTHIAZIDE 25 MG PO TABS: 12.5000 mg | ORAL_TABLET | Freq: Every day | ORAL | 3 refills | Status: DC

## 2017-10-15 MED FILL — HYDROCHLOROTHIAZIDE 25 MG T: 25 | 90 days supply | Qty: 45 | Fill #0

## 2017-10-15 MED FILL — metFORMIN HCL ER 750 MG TB2: 750 | 90 days supply | Qty: 90 | Fill #1

## 2017-10-15 MED FILL — AMITIZA 24 MCG CAPSULES: 24 | 30 days supply | Qty: 60 | Fill #1

## 2017-10-15 NOTE — Progress Notes (Signed)
Chief Complaint  Patient presents with  . 2 month f/u from 6/72019 visit, obesity, htn    HPI  She reports that she has angioedema She took benadryl  She had angioedema for 5 days She states that she stopped the lisinopril and  BP Readings from Last 3 Encounters:  11/11/17 (!) 138/94  10/15/17 (!) 147/91  10/02/17 (!) 148/96   She went to nutrition and was started on Christine high protein diet  And had some constipation  Lab Results  Component Value Date   HGBA1C 5.9 (H) 10/15/2017    Wt Readings from Last 3 Encounters:  11/11/17 250 lb 4 oz (113.5 kg)  10/15/17 262 lb (118.8 kg)  09/18/17 252 lb (114.3 kg)     4 review of systems  Past Medical History:  Diagnosis Date  . Anemia   . Constipation   . DM (diabetes mellitus) ([place])   . HTN (hypertension)   . Infertility, female     Current Outpatient Medications  Medication Sig Dispense Refill  . metFORMIN (GLUCOPHAGE-XR) 750 MG 24 hr tablet TAKE 1 TABLET BY MOUTH ONCE DAILY WITH BREAKFAST 90 tablet 1  . Prenatal Vit-Fe Fumarate-FA (PRENATAL MULTIVITAMIN) TABS tablet Take 1 tablet by mouth daily at 12 noon.    . hydrochlorothiazide (HYDRODIURIL) 25 MG tablet Take 0.5 tablets (12.5 mg total) by mouth daily. 90 tablet 3  . lubiprostone (AMITIZA) 24 MCG capsule Take 1 capsule (24 mcg total) by mouth 2 (two) times daily with Christine meal. 60 capsule 3  . Vitamin D, Ergocalciferol, (DRISDOL) 50000 units CAPS capsule Take 1 capsule (50,000 Units total) by mouth every 7 (seven) days. 4 capsule 0   No current facility-administered medications for this visit.     Allergies:  Allergies  Allergen Reactions  . Lisinopril Swelling    angioedema    Past Surgical History:  Procedure Laterality Date  . MYOMECTOMY      Social History   Socioeconomic History  . Marital status: Single    Spouse name: Not on file  . Number of children: 0  . Years of education: Not on file  . Highest education level: Not on file  Occupational  History  . Occupation: Engineer, mining  Social Needs  . Financial resource strain: Not on file  . Food insecurity:    Worry: Not on file    Inability: Not on file  . Transportation needs:    Medical: Not on file    Non-medical: Not on file  Tobacco Use  . Smoking status: Never Smoker  . Smokeless tobacco: Never Used  Substance and Sexual Activity  . Alcohol use: Yes    Alcohol/week: 0.0 standard drinks    Comment: occasional  . Drug use: No  . Sexual activity: Yes    Birth control/protection: None  Lifestyle  . Physical activity:    Days per week: Not on file    Minutes per session: Not on file  . Stress: Not on file  Relationships  . Social connections:    Talks on phone: Not on file    Gets together: Not on file    Attends religious service: Not on file    Active member of club or organization: Not on file    Attends meetings of clubs or organizations: Not on file    Relationship status: Not on file  Other Topics Concern  . Not on file  Social History Narrative  . Not on file    Family History  Problem  Relation Age of Onset  . Diabetes Mother   . Hypertension Mother   . Cancer Father   . Diabetes Brother   . Cancer Maternal Aunt        type unknown     ROS Review of Systems See HPI Constitution: No fevers or chills No malaise No diaphoresis Skin: No rash or itching Eyes: no blurry vision, no double vision GU: no dysuria or hematuria Neuro: no dizziness or headaches * all others reviewed and negative   Objective: Vitals:   10/15/17 1046  BP: (!) 147/91  Pulse: 85  Resp: 17  Temp: 98.6 F (37 C)  TempSrc: Oral  SpO2: 100%  Weight: 262 lb (118.8 kg)  Height: 5\' 2"  (1.575 m)    Physical Exam Physical Exam  Constitutional: She is oriented to person, place, and time. She appears well-developed and well-nourished.  HENT:  Head: Normocephalic and atraumatic.  Eyes: Conjunctivae and EOM are normal.  Cardiovascular: Normal rate, regular  rhythm and normal heart sounds.   Pulmonary/Chest: Effort normal and breath sounds normal. No respiratory distress. She has no wheezes.  Abdominal: Normal appearance and bowel sounds are normal. There is no tenderness. There is no CVA tenderness.  Neurological: She is alert and oriented to person, place, and time.    Assessment and Plan Christine Best was seen today for 2 month f/u from 6/72019 visit, obesity, htn.  Diagnoses and all orders for this visit:  Well controlled diabetes mellitus ([place])- well controlled hemoglobin a1c is at goal Continue exercise Lipids monitored and renal function in range On metformin D/c'd lisinopril due to ace allergy now on HCTZ On asa 81mg  Reviewed diabetic foot care Emphasized importance of eye and dental exam    -     Hemoglobin A1c  Need for prophylactic vaccination and inoculation against influenza -     Flu Vaccine QUAD 36+ mos IM  Essential hypertension- Patient's blood pressure is at goal of 139/89 or less. Condition is stable. Continue current medications and treatment plan. I recommend that you exercise for 30-45 minutes 5 days Christine week. I also recommend Christine balanced diet with fruits and vegetables every day, lean meats, and little fried foods. The DASH diet (you can find this online) is Christine good example of this.  -     hydrochlorothiazide (HYDRODIURIL) 25 MG tablet; Take 0.5 tablets (12.5 mg total) by mouth daily.  Vitamin D deficiency- will assess -     VITAMIN D 25 Hydroxy (Vit-D Deficiency, Fractures)     Christine Best Christine Best

## 2017-10-15 NOTE — Patient Instructions (Signed)
° ° ° °  If you have lab work done today you will be contacted with your lab results within the next 2 weeks.  If you have not heard from us then please contact us. The fastest way to get your results is to register for My Chart. ° ° °IF you received an x-ray today, you will receive an invoice from [facility]. Please contact [facility] at [phone] with questions or concerns regarding your invoice.  ° °IF you received labwork today, you will receive an invoice from [facility]. Please contact [facility] at [phone] with questions or concerns regarding your invoice.  ° °Our billing staff will not be able to assist you with questions regarding bills from these companies. ° °You will be contacted with the lab results as soon as they are available. The fastest way to get your results is to activate your My Chart account. Instructions are located on the last page of this paperwork. If you have not heard from us regarding the results in 2 weeks, please contact this office. °  ° ° ° °

## 2017-10-16 LAB — HEMOGLOBIN A1C
Est. average glucose Bld gHb Est-mCnc: 123 mg/dL
HEMOGLOBIN A1C: 5.9 % — AB (ref 4.8–5.6)

## 2017-10-16 LAB — VITAMIN D 25 HYDROXY (VIT D DEFICIENCY, FRACTURES): VIT D 25 HYDROXY: 26.7 ng/mL — AB (ref 30.0–100.0)

## 2017-11-11 ENCOUNTER — Encounter: Payer: Self-pay | Admitting: Gastroenterology

## 2017-11-11 ENCOUNTER — Ambulatory Visit: Payer: 59 | Admitting: Gastroenterology

## 2017-11-11 VITALS — BP 138/94 | HR 82 | Ht 61.0 in | Wt 250.2 lb

## 2017-11-11 DIAGNOSIS — K642 Third degree hemorrhoids: Secondary | ICD-10-CM

## 2017-11-11 DIAGNOSIS — K5909 Other constipation: Secondary | ICD-10-CM

## 2017-11-11 DIAGNOSIS — K625 Hemorrhage of anus and rectum: Secondary | ICD-10-CM

## 2017-11-11 DIAGNOSIS — K602 Anal fissure, unspecified: Secondary | ICD-10-CM

## 2017-11-11 MED ORDER — LUBIPROSTONE 24 MCG PO CAPS: 24.0000 ug | ORAL_CAPSULE | Freq: Two times a day (BID) | ORAL | 3 refills | Status: DC

## 2017-11-11 MED FILL — AMITIZA 24 MCG CAPSULES: 24 | 30 days supply | Qty: 60 | Fill #0

## 2017-11-11 NOTE — Patient Instructions (Addendum)
We have sent Amitiza to your pharmacy  Take Fiberchoice 1-2 tablets daily  Continue Nitroglycerin ointment 1-2 times daily for 2-4 weeks  If you are age 40 or older, your body mass index should be between 23-30. Your Body mass index is 47.28 kg/m. If this is out of the aforementioned range listed, please consider follow up with your Primary Care Provider.  If you are age 35 or younger, your body mass index should be between 19-25. Your Body mass index is 47.28 kg/m. If this is out of the aformentioned range listed, please consider follow up with your Primary Care Provider.    Thank you for choosing [facility]  Karleen Hampshire Nand[place]am,MD

## 2017-11-11 NOTE — Progress Notes (Signed)
Christine Best    [id number]    Jul 18, 1975  Primary Care Physician:Stallings, Arlie Solomons, MD  Referring Physician: Forrest Moron, MD [place], [place] [address]  Chief complaint: Constipation, hemorrhoids HPI: 42 year old female here for follow-up visit.  She was last seen in June 2019 with thrombosed grade 3 internal hemorrhoids.  She was recommended to do sitz bath, Benefiber, Anusol suppository and was also started on Amitiza 24 mcg twice daily.  She had anal fissure in May 2019 and was recommended to use rectal nitroglycerin. She is no longer using Anusol suppository or rectal nitroglycerin. She is taking Benefiber once a day in the morning, causes her bloating and excessive flatulence.  She is taking Amitiza 24 mcg once a day and MiraLAX 1 capful daily with bowel movement once every 3 days.  She continues to have some difficulty evacuating, denies straining.  She is not having rectal pain but has occasional discomfort.  Rectal bleeding is also less frequent on average once a week, mostly small-volume bright red blood when she wipes after a bowel movement.   Outpatient Encounter Medications as of 11/11/2017  Medication Sig  . hydrochlorothiazide (HYDRODIURIL) 25 MG tablet Take 0.5 tablets (12.5 mg total) by mouth daily.  . metFORMIN (GLUCOPHAGE-XR) 750 MG 24 hr tablet TAKE 1 TABLET BY MOUTH ONCE DAILY WITH BREAKFAST  . Prenatal Vit-Fe Fumarate-FA (PRENATAL MULTIVITAMIN) TABS tablet Take 1 tablet by mouth daily at 12 noon.  . Vitamin D, Ergocalciferol, (DRISDOL) 50000 units CAPS capsule Take 1 capsule (50,000 Units total) by mouth every 7 (seven) days.  [name] lubiprostone (AMITIZA) 24 MCG capsule Take 1 capsule (24 mcg total) by mouth 2 (two) times daily with a meal.   No facility-administered encounter medications on file as of 11/11/2017.     Allergies as of 11/11/2017 - Review Complete 11/11/2017  Allergen Reaction Noted  . Lisinopril Swelling 10/15/2017    Past  Medical History:  Diagnosis Date  . Anemia   . Constipation   . DM (diabetes mellitus) ([place])   . HTN (hypertension)   . Infertility, female     Past Surgical History:  Procedure Laterality Date  . MYOMECTOMY      Family History  Problem Relation Age of Onset  . Diabetes Mother   . Hypertension Mother   . Cancer Father   . Diabetes Brother   . Cancer Maternal Aunt        type unknown    Social History   Socioeconomic History  . Marital status: Single    Spouse name: Not on file  . Number of children: 0  . Years of education: Not on file  . Highest education level: Not on file  Occupational History  . Occupation: Engineer, mining  Social Needs  . Financial resource strain: Not on file  . Food insecurity:    Worry: Not on file    Inability: Not on file  . Transportation needs:    Medical: Not on file    Non-medical: Not on file  Tobacco Use  . Smoking status: Never Smoker  . Smokeless tobacco: Never Used  Substance and Sexual Activity  . Alcohol use: Yes    Alcohol/week: 0.0 standard drinks    Comment: occasional  . Drug use: No  . Sexual activity: Yes    Birth control/protection: None  Lifestyle  . Physical activity:    Days per week: Not on file    Minutes  per session: Not on file  . Stress: Not on file  Relationships  . Social connections:    Talks on phone: Not on file    Gets together: Not on file    Attends religious service: Not on file    Active member of club or organization: Not on file    Attends meetings of clubs or organizations: Not on file    Relationship status: Not on file  . Intimate partner violence:    Fear of current or ex partner: Not on file    Emotionally abused: Not on file    Physically abused: Not on file    Forced sexual activity: Not on file  Other Topics Concern  . Not on file  Social History Narrative  . Not on file      Review of systems: Review of Systems  Constitutional: Negative for fever and chills.    HENT: Negative.   Eyes: Negative for blurred vision.  Respiratory: Negative for cough, shortness of breath and wheezing.   Cardiovascular: Negative for chest pain and palpitations.  Gastrointestinal: as per HPI Genitourinary: Negative for dysuria, urgency, frequency and hematuria.  Musculoskeletal: Negative for myalgias, back pain and joint pain.  Skin: Negative for itching and rash.  Neurological: Negative for dizziness, tremors, focal weakness, seizures and loss of consciousness.  Endo/Heme/Allergies: Positive for seasonal allergies.  Psychiatric/Behavioral: Negative for depression, suicidal ideas and hallucinations.  All other systems reviewed and are negative.   Physical Exam: Vitals:   11/11/17 1018  BP: (!) 138/94  Pulse: 82   Body mass index is 47.28 kg/m. Gen:      No acute distress HEENT:  EOMI, sclera anicteric Neck:     No masses; no thyromegaly Lungs:    Clear to auscultation bilaterally; normal respiratory effort CV:         Regular rate and rhythm; no murmurs Abd:      + bowel sounds; soft, non-tender; no palpable masses, no distension Ext:    No edema; adequate peripheral perfusion Skin:      Warm and dry; no rash Neuro: alert and oriented x 3 Psych: normal mood and affect Rectal exam: Normal anal sphincter tone, no  external hemorrhoids Anoscopy: Small internal hemorrhoids, small posterior anal fissure, no active bleeding, normal dentate line, no visible nodules  Data Reviewed:  Reviewed labs, radiology imaging, old records and pertinent past GI work up   Assessment and Plan/Recommendations:  42 year old female with history of chronic constipation, symptomatic hemorrhoids and anal fissure here for follow-up visit Advised patient to take Amitiza 24 mcg twice daily Continue MiraLAX 1 capful daily We will try Fiberchoice tablets 1-2 daily as tolerated Increase fluid intake to 10 to 12 cups of water daily Small posterior anal fissure: Advised patient to  restart nitroglycerin small pea-sized amount of 0.25% per rectum 1-2 times daily for the next 2 to 4 weeks to improve healing and prevent recurrent anal fissure Avoid excessive straining during defecation If continues to have persistent symptoms especially blood per rectum, will consider colonoscopy to exclude neoplastic lesion Return in 3 to 4 months or sooner if needed   25 minutes was spent face-to-face with the patient. Greater than 50% of the time used for counseling as well as treatment plan and follow-up. She had multiple questions which were answered to her satisfaction  K. Denzil Magnuson , MD [phone]    CC: Forrest Moron, MD

## 2017-11-27 ENCOUNTER — Other Ambulatory Visit: Payer: Self-pay | Admitting: Family Medicine

## 2017-11-27 DIAGNOSIS — I10 Essential (primary) hypertension: Secondary | ICD-10-CM

## 2017-11-27 NOTE — Telephone Encounter (Signed)
Pharmacy is requesting office update Sig for medication.  Call to patient- patient reports she has been taking 1 whole pill since her last visit. She monitors her BP every 2-3 days and reports her average BP is 130/90. She reports she is not having any SE to medication at this time. Patient advised she does need to schedule a 6 month follow up for this medication and the office will call her if PCP has further concerns or questions.

## 2017-11-27 NOTE — Telephone Encounter (Signed)
Dr Nolon Rod please send updated directions since pt is taking one a day instead of the 1/2 tab prescribed/ Dgaddy, CMA

## 2017-12-02 ENCOUNTER — Encounter: Payer: Self-pay | Admitting: Family Medicine

## 2017-12-04 MED FILL — HYDROCHLOROTHIAZIDE 25 MG T: 25 | 90 days supply | Qty: 90 | Fill #0

## 2017-12-05 MED FILL — AMITIZA 24 MCG CAPSULES: 24 | 30 days supply | Qty: 60 | Fill #2

## 2017-12-06 DIAGNOSIS — D251 Intramural leiomyoma of uterus: Secondary | ICD-10-CM | POA: Diagnosis not present

## 2017-12-06 DIAGNOSIS — E288 Other ovarian dysfunction: Secondary | ICD-10-CM | POA: Diagnosis not present

## 2017-12-06 DIAGNOSIS — Z319 Encounter for procreative management, unspecified: Secondary | ICD-10-CM | POA: Diagnosis not present

## 2017-12-30 ENCOUNTER — Inpatient Hospital Stay (HOSPITAL_COMMUNITY): Admission: RE | Admit: 2017-12-30 | Payer: 59 | Source: Ambulatory Visit

## 2017-12-31 NOTE — Patient Instructions (Addendum)
Christine Best  12/31/2017   Your procedure is scheduled on: 01-03-18   Report to [facility] Main  Entrance              Report to admitting at      0800  AM    Call this number if you have problems the morning of surgery [phone]    Remember: Do not eat food or drink liquids :After Midnight  BRUSH YOUR TEETH MORNING OF SURGERY AND RINSE YOUR MOUTH OUT, NO CHEWING GUM CANDY OR MINTS.     Take these medicines the morning of surgery with A SIP OF WATER: none DO NOT TAKE ANY DIABETIC MEDICATIONS DAY OF YOUR SURGERY                               You may not have any metal on your body including hair pins and              piercings  Do not wear jewelry, make-up, lotions, powders or perfumes, deodorant             Do not wear nail polish.  Do not shave  48 hours prior to surgery.            Do not bring valuables to the hospital. [place].  Contacts, dentures or bridgework may not be worn into surgery.      Patients discharged the day of surgery will not be allowed to drive home.  Name and phone number of your driver:  Special Instructions: N/A              Please read over the following fact sheets you were given: _____________________________________________________________________             [facility] - Preparing for Surgery Before surgery, you can play an important role.  Because skin is not sterile, your skin needs to be as free of germs as possible.  You can reduce the number of germs on your skin by washing with CHG (chlorahexidine gluconate) soap before surgery.  CHG is an antiseptic cleaner which kills germs and bonds with the skin to continue killing germs even after washing. Please DO NOT use if you have an allergy to CHG or antibacterial soaps.  If your skin becomes reddened/irritated stop using the CHG and inform your nurse when you arrive at Short Stay. Do not shave (including legs and  underarms) for at least 48 hours prior to the first CHG shower.  You may shave your face/neck. Please follow these instructions carefully:  1.  Shower with CHG Soap the night before surgery and the  morning of Surgery.  2.  If you choose to wash your hair, wash your hair first as usual with your  normal  shampoo.  3.  After you shampoo, rinse your hair and body thoroughly to remove the  shampoo.                           4.  Use CHG as you would any other liquid soap.  You can apply chg directly  to the skin and wash  Gently with a scrungie or clean washcloth.  5.  Apply the CHG Soap to your body ONLY FROM THE NECK DOWN.   Do not use on face/ open                           Wound or open sores. Avoid contact with eyes, ears mouth and genitals (private parts).                       Wash face,  Genitals (private parts) with your normal soap.             6.  Wash thoroughly, paying special attention to the area where your surgery  will be performed.  7.  Thoroughly rinse your body with warm water from the neck down.  8.  DO NOT shower/wash with your normal soap after using and rinsing off  the CHG Soap.                9.  Pat yourself dry with a clean towel.            10.  Wear clean pajamas.            11.  Place clean sheets on your bed the night of your first shower and do not  sleep with pets. Day of Surgery : Do not apply any lotions/deodorants the morning of surgery.  Please wear clean clothes to the hospital/surgery center.  FAILURE TO FOLLOW THESE INSTRUCTIONS MAY RESULT IN THE CANCELLATION OF YOUR SURGERY PATIENT SIGNATURE_________________________________  NURSE SIGNATURE__________________________________  ________________________________________________________________________

## 2018-01-01 ENCOUNTER — Encounter (HOSPITAL_COMMUNITY): Payer: Self-pay

## 2018-01-01 ENCOUNTER — Encounter (HOSPITAL_COMMUNITY)
Admission: RE | Admit: 2018-01-01 | Discharge: 2018-01-01 | Disposition: A | Discharge status: Home / Self Care | Payer: 59 | Source: Ambulatory Visit | Attending: Obstetrics and Gynecology | Admitting: Obstetrics and Gynecology

## 2018-01-01 ENCOUNTER — Other Ambulatory Visit: Payer: Self-pay

## 2018-01-01 DIAGNOSIS — Z01812 Encounter for preprocedural laboratory examination: Secondary | ICD-10-CM | POA: Insufficient documentation

## 2018-01-01 DIAGNOSIS — D219 Benign neoplasm of connective and other soft tissue, unspecified: Secondary | ICD-10-CM | POA: Diagnosis not present

## 2018-01-01 LAB — CBC
HCT: 35.5 % — ABNORMAL LOW (ref 36.0–46.0)
HEMOGLOBIN: 10.3 g/dL — AB (ref 12.0–15.0)
MCH: 20.1 pg — ABNORMAL LOW (ref 26.0–34.0)
MCHC: 29 g/dL — AB (ref 30.0–36.0)
MCV: 69.3 fL — ABNORMAL LOW (ref 80.0–100.0)
NRBC: 0 % (ref 0.0–0.2)
PLATELETS: 423 10*3/uL — AB (ref 150–400)
RBC: 5.12 MIL/uL — ABNORMAL HIGH (ref 3.87–5.11)
RDW: 17 % — ABNORMAL HIGH (ref 11.5–15.5)
WBC: 6.9 10*3/uL (ref 4.0–10.5)

## 2018-01-01 LAB — BASIC METABOLIC PANEL
ANION GAP: 11 (ref 5–15)
BUN: 8 mg/dL (ref 6–20)
CHLORIDE: 100 mmol/L (ref 98–111)
CO2: 25 mmol/L (ref 22–32)
Calcium: 8.9 mg/dL (ref 8.9–10.3)
Creatinine, Ser: 0.88 mg/dL (ref 0.44–1.00)
Glucose, Bld: 159 mg/dL — ABNORMAL HIGH (ref 70–99)
POTASSIUM: 3.5 mmol/L (ref 3.5–5.1)
SODIUM: 136 mmol/L (ref 135–145)

## 2018-01-01 LAB — GLUCOSE, CAPILLARY: GLUCOSE-CAPILLARY: 155 mg/dL — AB (ref 70–99)

## 2018-01-01 NOTE — Progress Notes (Addendum)
Ekg 16-19 in epic hgba1c 10-16-17 in epic

## 2018-01-03 ENCOUNTER — Encounter (HOSPITAL_BASED_OUTPATIENT_CLINIC_OR_DEPARTMENT_OTHER): Admission: RE | Payer: Self-pay | Source: Ambulatory Visit

## 2018-01-03 ENCOUNTER — Ambulatory Visit (HOSPITAL_BASED_OUTPATIENT_CLINIC_OR_DEPARTMENT_OTHER): Admission: RE | Admit: 2018-01-03 | Payer: 59 | Source: Ambulatory Visit | Admitting: Obstetrics and Gynecology

## 2018-01-03 LAB — TYPE AND SCREEN
ABO/RH(D): O POS
Antibody Screen: NEGATIVE

## 2018-01-03 SURGERY — MYOMECTOMY, ROBOT-ASSISTED
Anesthesia: General

## 2018-01-07 ENCOUNTER — Other Ambulatory Visit: Payer: Self-pay | Admitting: Family Medicine

## 2018-01-07 DIAGNOSIS — E119 Type 2 diabetes mellitus without complications: Secondary | ICD-10-CM

## 2018-01-07 MED FILL — HYDROCORTISONE ACETATE 25 M: 25 | 12 days supply | Qty: 24 | Fill #1

## 2018-01-07 MED FILL — metFORMIN HCL ER 750 MG TB2: 750 | 90 days supply | Qty: 90 | Fill #0

## 2018-01-07 MED FILL — AMITIZA 24 MCG CAPSULES: 24 | 30 days supply | Qty: 60 | Fill #3

## 2018-02-18 MED FILL — HYDROCHLOROTHIAZIDE 25 MG T: 25 | 90 days supply | Qty: 90 | Fill #1

## 2018-02-18 MED FILL — AMITIZA 24 MCG CAPSULES: 24 | 30 days supply | Qty: 60 | Fill #0

## 2018-03-20 NOTE — Patient Instructions (Addendum)
Christine Best  03/20/2018      Your procedure is scheduled on 03-26-2018    Report to [facility]  at  10:30A.M. , SURGERY WILL START  AT 12: 30PM TIL 3:30PM    Call this number if you have problems the morning of surgery:[phone]    OUR ADDRESS IS [place].   Remember:    Do not eat food or drink liquids after midnight.  Take these medicines the morning of surgery with A SIP OF WATER:   NONE   Do not wear jewelry, make-up or nail polish.  Do not wear lotions, powders, or perfumes, or deoderant.  Do not shave 48 hours prior to surgery.  Men may shave face and neck.  Do not bring valuables to the hospital.  [facility] is not responsible for any belongings or valuables.    Contacts, dentures or bridgework may not be worn into surgery.  Leave your suitcase in the car.  After surgery it may be brought to your room.  For patients admitted to the hospital, discharge time will be determined by your treatment team.  Special instructions:  N/A  Please read over the following fact sheets that you were given:         Patients discharged the day of surgery will not be allowed to drive home. IF YOU ARE HAVING SURGERY AND GOING HOME THE SAME DAY, YOU MUST HAVE AN ADULT TO DRIVE YOU HOME AND BE WITH YOU FOR 24 HOURS. YOU MAY GO HOME BY TAXI OR UBER OR ORTHERWISE, BUT AN ADULT MUST ACCOMPANY YOU HOME AND STAY WITH YOU FOR 24 HOURS.  Name and phone number of your driver:  Special Instructions: N/A              Please read over the following fact sheets you were given: _____________________________________________________________________             [facility] - Preparing for Surgery Before surgery, you can play an important role.  Because skin is not sterile, your skin needs to be as free of germs as possible.  You can reduce the number of germs on your skin by washing with CHG  (chlorahexidine gluconate) soap before surgery.  CHG is an antiseptic cleaner which kills germs and bonds with the skin to continue killing germs even after washing. Please DO NOT use if you have an allergy to CHG or antibacterial soaps.  If your skin becomes reddened/irritated stop using the CHG and inform your nurse when you arrive at Short Stay. Do not shave (including legs and underarms) for at least 48 hours prior to the first CHG shower.  You may shave your face/neck. Please follow these instructions carefully:  1.  Shower with CHG Soap the night before surgery and the  morning of Surgery.  2.  If you choose to wash your hair, wash your hair first as usual with your  normal  shampoo.  3.  After you shampoo, rinse your hair and body thoroughly to remove the  shampoo.                           4.  Use CHG as you would any other liquid soap.  You can apply chg directly  to the skin and wash  Gently with a scrungie or clean washcloth.  5.  Apply the CHG Soap to your body ONLY FROM THE NECK DOWN.   Do not use on face/ open                           Wound or open sores. Avoid contact with eyes, ears mouth and genitals (private parts).                       Wash face,  Genitals (private parts) with your normal soap.             6.  Wash thoroughly, paying special attention to the area where your surgery  will be performed.  7.  Thoroughly rinse your body with warm water from the neck down.  8.  DO NOT shower/wash with your normal soap after using and rinsing off  the CHG Soap.                9.  Pat yourself dry with a clean towel.            10.  Wear clean pajamas.            11.  Place clean sheets on your bed the night of your first shower and do not  sleep with pets. Day of Surgery : Do not apply any lotions/deodorants the morning of surgery.  Please wear clean clothes to the hospital/surgery center.  FAILURE TO FOLLOW THESE INSTRUCTIONS MAY RESULT IN THE CANCELLATION OF  YOUR SURGERY PATIENT SIGNATURE_________________________________  NURSE SIGNATURE__________________________________  ________________________________________________________________________

## 2018-03-20 NOTE — Progress Notes (Signed)
ekg 09-03-17 epic

## 2018-03-21 ENCOUNTER — Encounter (HOSPITAL_COMMUNITY): Payer: Self-pay

## 2018-03-21 ENCOUNTER — Other Ambulatory Visit: Payer: Self-pay

## 2018-03-21 ENCOUNTER — Encounter (HOSPITAL_COMMUNITY)
Admission: RE | Admit: 2018-03-21 | Discharge: 2018-03-21 | Disposition: A | Discharge status: Home / Self Care | Payer: 59 | Source: Ambulatory Visit | Attending: Obstetrics and Gynecology | Admitting: Obstetrics and Gynecology

## 2018-03-21 DIAGNOSIS — Z01812 Encounter for preprocedural laboratory examination: Secondary | ICD-10-CM | POA: Diagnosis not present

## 2018-03-21 LAB — CBC
HEMATOCRIT: 35.3 % — AB (ref 36.0–46.0)
HEMOGLOBIN: 10.1 g/dL — AB (ref 12.0–15.0)
MCH: 20.4 pg — ABNORMAL LOW (ref 26.0–34.0)
MCHC: 28.6 g/dL — ABNORMAL LOW (ref 30.0–36.0)
MCV: 71.3 fL — ABNORMAL LOW (ref 80.0–100.0)
NRBC: 0 % (ref 0.0–0.2)
Platelets: 469 10*3/uL — ABNORMAL HIGH (ref 150–400)
RBC: 4.95 MIL/uL (ref 3.87–5.11)
RDW: 18.8 % — ABNORMAL HIGH (ref 11.5–15.5)
WBC: 7.1 10*3/uL (ref 4.0–10.5)

## 2018-03-21 LAB — BASIC METABOLIC PANEL
ANION GAP: 9 (ref 5–15)
BUN: 10 mg/dL (ref 6–20)
CALCIUM: 9.1 mg/dL (ref 8.9–10.3)
CHLORIDE: 99 mmol/L (ref 98–111)
CO2: 26 mmol/L (ref 22–32)
Creatinine, Ser: 0.78 mg/dL (ref 0.44–1.00)
GFR calc non Af Amer: >60 mL/min (ref >60)
Glucose, Bld: 99 mg/dL (ref 70–99)
Potassium: 3.8 mmol/L (ref 3.5–5.1)
Sodium: 134 mmol/L — ABNORMAL LOW (ref 135–145)

## 2018-03-21 LAB — HEMOGLOBIN A1C
Hgb A1c MFr Bld: 6.8 % — ABNORMAL HIGH (ref 4.8–5.6)
MEAN PLASMA GLUCOSE: 148.46 mg/dL

## 2018-03-21 LAB — GLUCOSE, CAPILLARY: Glucose-Capillary: 110 mg/dL — ABNORMAL HIGH (ref 70–99)

## 2018-03-26 ENCOUNTER — Ambulatory Visit (HOSPITAL_BASED_OUTPATIENT_CLINIC_OR_DEPARTMENT_OTHER): Payer: 59 | Admitting: Anesthesiology

## 2018-03-26 ENCOUNTER — Ambulatory Visit (HOSPITAL_BASED_OUTPATIENT_CLINIC_OR_DEPARTMENT_OTHER): Payer: 59 | Admitting: Physician Assistant

## 2018-03-26 ENCOUNTER — Encounter (HOSPITAL_BASED_OUTPATIENT_CLINIC_OR_DEPARTMENT_OTHER)
Admission: RE | Disposition: A | Discharge status: Home / Self Care | Payer: Self-pay | Source: Home / Self Care | Attending: Obstetrics and Gynecology

## 2018-03-26 ENCOUNTER — Ambulatory Visit (HOSPITAL_BASED_OUTPATIENT_CLINIC_OR_DEPARTMENT_OTHER)
Admission: RE | Admit: 2018-03-26 | Discharge: 2018-03-26 | Disposition: A | Discharge status: Home / Self Care | Payer: 59 | Attending: Obstetrics and Gynecology | Admitting: Obstetrics and Gynecology

## 2018-03-26 ENCOUNTER — Encounter (HOSPITAL_BASED_OUTPATIENT_CLINIC_OR_DEPARTMENT_OTHER): Payer: Self-pay

## 2018-03-26 ENCOUNTER — Ambulatory Visit (HOSPITAL_COMMUNITY): Payer: 59

## 2018-03-26 DIAGNOSIS — N838 Other noninflammatory disorders of ovary, fallopian tube and broad ligament: Secondary | ICD-10-CM | POA: Insufficient documentation

## 2018-03-26 DIAGNOSIS — I10 Essential (primary) hypertension: Secondary | ICD-10-CM | POA: Diagnosis not present

## 2018-03-26 DIAGNOSIS — Z7984 Long term (current) use of oral hypoglycemic drugs: Secondary | ICD-10-CM | POA: Diagnosis not present

## 2018-03-26 DIAGNOSIS — Z419 Encounter for procedure for purposes other than remedying health state, unspecified: Secondary | ICD-10-CM

## 2018-03-26 DIAGNOSIS — E119 Type 2 diabetes mellitus without complications: Secondary | ICD-10-CM | POA: Insufficient documentation

## 2018-03-26 DIAGNOSIS — D251 Intramural leiomyoma of uterus: Secondary | ICD-10-CM | POA: Insufficient documentation

## 2018-03-26 DIAGNOSIS — N920 Excessive and frequent menstruation with regular cycle: Secondary | ICD-10-CM | POA: Diagnosis not present

## 2018-03-26 DIAGNOSIS — N803 Endometriosis of pelvic peritoneum: Secondary | ICD-10-CM | POA: Insufficient documentation

## 2018-03-26 DIAGNOSIS — D252 Subserosal leiomyoma of uterus: Secondary | ICD-10-CM | POA: Diagnosis not present

## 2018-03-26 DIAGNOSIS — E288 Other ovarian dysfunction: Secondary | ICD-10-CM | POA: Diagnosis not present

## 2018-03-26 DIAGNOSIS — Z6841 Body Mass Index (BMI) 40.0 and over, adult: Secondary | ICD-10-CM | POA: Insufficient documentation

## 2018-03-26 DIAGNOSIS — D259 Leiomyoma of uterus, unspecified: Secondary | ICD-10-CM | POA: Diagnosis not present

## 2018-03-26 DIAGNOSIS — Z79899 Other long term (current) drug therapy: Secondary | ICD-10-CM | POA: Diagnosis not present

## 2018-03-26 HISTORY — PX: ROBOT ASSISTED MYOMECTOMY: SHX5142

## 2018-03-26 LAB — TYPE AND SCREEN
ABO/RH(D): O POS
Antibody Screen: NEGATIVE

## 2018-03-26 LAB — GLUCOSE, CAPILLARY
GLUCOSE-CAPILLARY: 95 mg/dL (ref 70–99)
Glucose-Capillary: 192 mg/dL — ABNORMAL HIGH (ref 70–99)

## 2018-03-26 SURGERY — MYOMECTOMY, ROBOT-ASSISTED
Anesthesia: General | Site: Abdomen

## 2018-03-26 MED ORDER — MIDAZOLAM HCL 2 MG/2ML IJ SOLN
INTRAMUSCULAR | Status: AC
  Filled 2018-03-26: qty 2

## 2018-03-26 MED ORDER — SODIUM CHLORIDE (PF) 0.9 % IJ SOLN
INTRAMUSCULAR | Status: AC
  Filled 2018-03-26: qty 50

## 2018-03-26 MED ORDER — SUGAMMADEX SODIUM 200 MG/2ML IV SOLN
INTRAVENOUS | Status: AC
  Filled 2018-03-26: qty 2

## 2018-03-26 MED ORDER — DEXAMETHASONE SODIUM PHOSPHATE 4 MG/ML IJ SOLN
INTRAMUSCULAR | Status: DC | PRN
  Administered 2018-03-26: 10 mg via INTRAVENOUS

## 2018-03-26 MED ORDER — STERILE WATER FOR IRRIGATION IR SOLN
Status: DC | PRN
  Administered 2018-03-26 (×2): 10 mL

## 2018-03-26 MED ORDER — OXYCODONE HCL 5 MG/5ML PO SOLN
5.0000 mg | Freq: Once | ORAL | Status: AC | PRN
  Filled 2018-03-26: qty 5

## 2018-03-26 MED ORDER — ONDANSETRON HCL 4 MG/2ML IJ SOLN
INTRAMUSCULAR | Status: AC
  Filled 2018-03-26: qty 2

## 2018-03-26 MED ORDER — OXYCODONE-ACETAMINOPHEN 7.5-325 MG PO TABS: 1.0000 | ORAL_TABLET | ORAL | 0 refills | Status: DC | PRN

## 2018-03-26 MED ORDER — CEFAZOLIN SODIUM-DEXTROSE 1-4 GM/50ML-% IV SOLN
INTRAVENOUS | Status: AC
  Filled 2018-03-26: qty 50

## 2018-03-26 MED ORDER — ROCURONIUM BROMIDE 100 MG/10ML IV SOLN
INTRAVENOUS | Status: AC
  Filled 2018-03-26: qty 1

## 2018-03-26 MED ORDER — DEXAMETHASONE SODIUM PHOSPHATE 10 MG/ML IJ SOLN
INTRAMUSCULAR | Status: AC
  Filled 2018-03-26: qty 1

## 2018-03-26 MED ORDER — PROMETHAZINE HCL 25 MG/ML IJ SOLN: 6.2500 mg | INTRAMUSCULAR | Status: DC | PRN

## 2018-03-26 MED ORDER — ROCURONIUM BROMIDE 100 MG/10ML IV SOLN
INTRAVENOUS | Status: DC | PRN
  Administered 2018-03-26: 60 mg via INTRAVENOUS
  Administered 2018-03-26 (×3): 20 mg via INTRAVENOUS
  Administered 2018-03-26: 10 mg via INTRAVENOUS

## 2018-03-26 MED ORDER — SCOPOLAMINE 1 MG/3DAYS TD PT72
1.0000 | MEDICATED_PATCH | TRANSDERMAL | Status: DC
  Administered 2018-03-26: 1.5 mg via TRANSDERMAL
  Filled 2018-03-26: qty 1

## 2018-03-26 MED ORDER — PROPOFOL 10 MG/ML IV BOLUS
INTRAVENOUS | Status: AC
  Filled 2018-03-26: qty 40

## 2018-03-26 MED ORDER — SODIUM CHLORIDE 0.9 % IR SOLN
Status: DC | PRN
  Administered 2018-03-26: 1000 mL

## 2018-03-26 MED ORDER — FENTANYL CITRATE (PF) 100 MCG/2ML IJ SOLN
25.0000 ug | INTRAMUSCULAR | Status: DC | PRN
  Administered 2018-03-26: 25 ug via INTRAVENOUS
  Administered 2018-03-26: 50 ug via INTRAVENOUS
  Filled 2018-03-26: qty 1

## 2018-03-26 MED ORDER — CEFAZOLIN SODIUM-DEXTROSE 2-4 GM/100ML-% IV SOLN
INTRAVENOUS | Status: AC
  Filled 2018-03-26: qty 100

## 2018-03-26 MED ORDER — LIDOCAINE 2% (20 MG/ML) 5 ML SYRINGE
INTRAMUSCULAR | Status: AC
  Filled 2018-03-26: qty 5

## 2018-03-26 MED ORDER — KETOROLAC TROMETHAMINE 30 MG/ML IJ SOLN
INTRAMUSCULAR | Status: AC
  Filled 2018-03-26: qty 1

## 2018-03-26 MED ORDER — FENTANYL CITRATE (PF) 100 MCG/2ML IJ SOLN
INTRAMUSCULAR | Status: AC
  Filled 2018-03-26: qty 2

## 2018-03-26 MED ORDER — CEFAZOLIN SODIUM-DEXTROSE 2-4 GM/100ML-% IV SOLN
2.0000 g | INTRAVENOUS | Status: AC
  Administered 2018-03-26 (×2): 2 g via INTRAVENOUS
  Filled 2018-03-26: qty 100

## 2018-03-26 MED ORDER — VASOPRESSIN 20 UNIT/ML IV SOLN
INTRAVENOUS | Status: DC | PRN
  Administered 2018-03-26: 33 mL via INTRAMUSCULAR

## 2018-03-26 MED ORDER — ONDANSETRON 4 MG PO TBDP
ORAL_TABLET | ORAL | Status: AC
  Filled 2018-03-26: qty 1

## 2018-03-26 MED ORDER — VASOPRESSIN 20 UNIT/ML IV SOLN
INTRAVENOUS | Status: AC
  Filled 2018-03-26: qty 1

## 2018-03-26 MED ORDER — ARTIFICIAL TEARS OPHTHALMIC OINT
TOPICAL_OINTMENT | OPHTHALMIC | Status: AC
  Filled 2018-03-26: qty 3.5

## 2018-03-26 MED ORDER — FENTANYL CITRATE (PF) 100 MCG/2ML IJ SOLN
INTRAMUSCULAR | Status: DC | PRN
  Administered 2018-03-26: 50 ug via INTRAVENOUS
  Administered 2018-03-26: 100 ug via INTRAVENOUS
  Administered 2018-03-26: 50 ug via INTRAVENOUS
  Administered 2018-03-26: 150 ug via INTRAVENOUS

## 2018-03-26 MED ORDER — BUPIVACAINE-EPINEPHRINE (PF) 0.5% -1:200000 IJ SOLN
INTRAMUSCULAR | Status: DC | PRN
  Administered 2018-03-26: 18 mL

## 2018-03-26 MED ORDER — OXYCODONE HCL 5 MG PO TABS
ORAL_TABLET | ORAL | Status: AC
  Filled 2018-03-26: qty 1

## 2018-03-26 MED ORDER — SCOPOLAMINE 1 MG/3DAYS TD PT72
MEDICATED_PATCH | TRANSDERMAL | Status: AC
  Filled 2018-03-26: qty 1

## 2018-03-26 MED ORDER — SUGAMMADEX SODIUM 200 MG/2ML IV SOLN
INTRAVENOUS | Status: DC | PRN
  Administered 2018-03-26: 240 mg via INTRAVENOUS

## 2018-03-26 MED ORDER — LIDOCAINE HCL (CARDIAC) PF 100 MG/5ML IV SOSY
PREFILLED_SYRINGE | INTRAVENOUS | Status: DC | PRN
  Administered 2018-03-26: 100 mg via INTRAVENOUS

## 2018-03-26 MED ORDER — PROPOFOL 10 MG/ML IV BOLUS
INTRAVENOUS | Status: DC | PRN
  Administered 2018-03-26: 200 mg via INTRAVENOUS
  Administered 2018-03-26: 80 mg via INTRAVENOUS

## 2018-03-26 MED ORDER — ONDANSETRON 4 MG PO TBDP
4.0000 mg | ORAL_TABLET | Freq: Once | ORAL | Status: AC
  Administered 2018-03-26: 4 mg via ORAL
  Filled 2018-03-26: qty 1

## 2018-03-26 MED ORDER — LABETALOL HCL 5 MG/ML IV SOLN
INTRAVENOUS | Status: AC
  Filled 2018-03-26: qty 4

## 2018-03-26 MED ORDER — LACTATED RINGERS IV SOLN
INTRAVENOUS | Status: DC
  Administered 2018-03-26 (×3): via INTRAVENOUS
  Filled 2018-03-26: qty 1000

## 2018-03-26 MED ORDER — ONDANSETRON HCL 4 MG/2ML IJ SOLN
INTRAMUSCULAR | Status: DC | PRN
  Administered 2018-03-26: 4 mg via INTRAVENOUS

## 2018-03-26 MED ORDER — MIDAZOLAM HCL 5 MG/5ML IJ SOLN
INTRAMUSCULAR | Status: DC | PRN
  Administered 2018-03-26: 2 mg via INTRAVENOUS

## 2018-03-26 MED ORDER — KETOROLAC TROMETHAMINE 30 MG/ML IJ SOLN
INTRAMUSCULAR | Status: DC | PRN
  Administered 2018-03-26: 30 mg via INTRAVENOUS

## 2018-03-26 MED ORDER — BUPIVACAINE-EPINEPHRINE (PF) 0.5% -1:200000 IJ SOLN
INTRAMUSCULAR | Status: AC
  Filled 2018-03-26: qty 30

## 2018-03-26 MED ORDER — ONDANSETRON HCL 4 MG PO TABS: 4.0000 mg | ORAL_TABLET | Freq: Every day | ORAL | 1 refills | Status: DC | PRN

## 2018-03-26 MED ORDER — FENTANYL CITRATE (PF) 250 MCG/5ML IJ SOLN
INTRAMUSCULAR | Status: AC
  Filled 2018-03-26: qty 5

## 2018-03-26 MED ORDER — LABETALOL HCL 5 MG/ML IV SOLN
INTRAVENOUS | Status: DC | PRN
  Administered 2018-03-26 (×2): 5 mg via INTRAVENOUS

## 2018-03-26 MED ORDER — OXYCODONE HCL 5 MG PO TABS
5.0000 mg | ORAL_TABLET | Freq: Once | ORAL | Status: AC | PRN
  Administered 2018-03-26: 5 mg via ORAL
  Filled 2018-03-26: qty 1

## 2018-03-26 SURGICAL SUPPLY — 68 items
BARRIER ADHS 3X4 INTERCEED (GAUZE/BANDAGES/DRESSINGS) IMPLANT
BENZOIN TINCTURE PRP APPL 2/3 (GAUZE/BANDAGES/DRESSINGS) ×2 IMPLANT
BLADE LAP MORCELLATOR 15X9.5 (ELECTROSURGICAL) ×2 IMPLANT
BLADE MORCELLATOR EXT  12.5X15 (ELECTROSURGICAL)
BLADE MORCELLATOR EXT 12.5X15 (ELECTROSURGICAL) IMPLANT
CATH ROBINSON RED A/P 16FR (CATHETERS) ×2 IMPLANT
CONT PATH 16OZ SNAP LID 3702 (MISCELLANEOUS) ×2 IMPLANT
COVER BACK TABLE 60X90IN (DRAPES) ×4 IMPLANT
COVER TIP SHEARS 8 DVNC (MISCELLANEOUS) ×1 IMPLANT
COVER TIP SHEARS 8MM DA VINCI (MISCELLANEOUS) ×1
COVER WAND RF STERILE (DRAPES) IMPLANT
DECANTER SPIKE VIAL GLASS SM (MISCELLANEOUS) ×8 IMPLANT
DEFOGGER SCOPE WARMER CLEARIFY (MISCELLANEOUS) ×2 IMPLANT
DEPRESSOR TONGUE BLADE STERILE (MISCELLANEOUS) ×2 IMPLANT
DRAPE ARM DVNC X/XI (DISPOSABLE) ×4 IMPLANT
DRAPE COLUMN DVNC XI (DISPOSABLE) ×1 IMPLANT
DRAPE DA VINCI XI ARM (DISPOSABLE) ×4
DRAPE DA VINCI XI COLUMN (DISPOSABLE) ×1
DRSG OPSITE POSTOP 3X4 (GAUZE/BANDAGES/DRESSINGS) ×2 IMPLANT
DURAPREP 26ML APPLICATOR (WOUND CARE) ×2 IMPLANT
ELECT REM PT RETURN 9FT ADLT (ELECTROSURGICAL) ×4
ELECTRODE REM PT RTRN 9FT ADLT (ELECTROSURGICAL) ×2 IMPLANT
FILTER SMOKE EVAC LAPAROSHD (FILTER) ×2 IMPLANT
GLOVE BIO SURGEON STRL SZ8 (GLOVE) ×6 IMPLANT
GLOVE BIOGEL PI IND STRL 7.0 (GLOVE) ×2 IMPLANT
GLOVE BIOGEL PI IND STRL 8.5 (GLOVE) ×1 IMPLANT
GLOVE BIOGEL PI INDICATOR 7.0 (GLOVE) ×2
GLOVE BIOGEL PI INDICATOR 8.5 (GLOVE) ×1
IRRIG SUCT STRYKERFLOW 2 WTIP (MISCELLANEOUS) ×2
IRRIGATION SUCT STRKRFLW 2 WTP (MISCELLANEOUS) ×1 IMPLANT
LEGGING LITHOTOMY PAIR STRL (DRAPES) ×2 IMPLANT
MANIPULATOR UTERINE 4.5 ZUMI (MISCELLANEOUS) ×2 IMPLANT
MANIPULATOR UTERINE 7CM CLEARV (MISCELLANEOUS) IMPLANT
NEEDLE INSUFFLATION 120MM (ENDOMECHANICALS) ×2 IMPLANT
NEEDLE SPNL 22GX7 QUINCKE BK (NEEDLE) ×2 IMPLANT
OBTURATOR OPTICAL STANDARD 8MM (TROCAR)
OBTURATOR OPTICAL STND 8 DVNC (TROCAR)
OBTURATOR OPTICALSTD 8 DVNC (TROCAR) IMPLANT
PACK ROBOT WH (CUSTOM PROCEDURE TRAY) ×2 IMPLANT
PACK ROBOTIC GOWN (GOWN DISPOSABLE) ×2 IMPLANT
PACK TRENDGUARD 450 HYBRID PRO (MISCELLANEOUS) ×1 IMPLANT
PAD PREP 24X48 CUFFED NSTRL (MISCELLANEOUS) ×2 IMPLANT
PROTECTOR NERVE ULNAR (MISCELLANEOUS) ×4 IMPLANT
SEAL CANN UNIV 5-8 DVNC XI (MISCELLANEOUS) ×3 IMPLANT
SEAL XI 5MM-8MM UNIVERSAL (MISCELLANEOUS) ×3
SEPRAFILM MEMBRANE 5X6 (MISCELLANEOUS) ×8 IMPLANT
SET CYSTO W/LG BORE CLAMP LF (SET/KITS/TRAYS/PACK) IMPLANT
SET TRI-LUMEN FLTR TB AIRSEAL (TUBING) IMPLANT
STOPCOCK 4 WAY LG BORE MALE ST (IV SETS) ×2 IMPLANT
STRIP CLOSURE SKIN 1/2X4 (GAUZE/BANDAGES/DRESSINGS) ×2 IMPLANT
SUT DVC VLOC 180 2-0 12IN GS21 (SUTURE) ×2
SUT MNCRL AB 4-0 PS2 18 (SUTURE) ×6 IMPLANT
SUT VIC AB 2-0 CT1 27 (SUTURE)
SUT VIC AB 2-0 CT1 TAPERPNT 27 (SUTURE) IMPLANT
SUT VIC AB 2-0 CT2 27 (SUTURE) IMPLANT
SUT VIC AB 4-0 KS 27 (SUTURE) ×2 IMPLANT
SUT VICRYL 0 UR6 27IN ABS (SUTURE) ×2 IMPLANT
SUT VLOC 180 2-0 6IN GS21 (SUTURE) ×12 IMPLANT
SUT VLOC 180 2-0 9IN GS21 (SUTURE) ×18 IMPLANT
SUT VLOC 180 3-0 9IN GS21 (SUTURE) IMPLANT
SUTURE DVC VL 180 2-0 12INGS21 (SUTURE) ×1 IMPLANT
SYR 50ML LL SCALE MARK (SYRINGE) ×2 IMPLANT
SYRINGE 60CC LL (MISCELLANEOUS) ×2 IMPLANT
SYSTEM CARTER THOMASON II (TROCAR) ×4 IMPLANT
TOWEL OR 17X24 6PK STRL BLUE (TOWEL DISPOSABLE) ×4 IMPLANT
TRAY FOLEY W/BAG SLVR 14FR (SET/KITS/TRAYS/PACK) ×2 IMPLANT
TRENDGUARD 450 HYBRID PRO PACK (MISCELLANEOUS) ×2
TROCAR PORT AIRSEAL 5X120 (TROCAR) IMPLANT

## 2018-03-26 NOTE — Discharge Instructions (Signed)
Laparoscopy, Care After Refer to this sheet in the next few weeks. These instructions provide you with information about caring for yourself after your procedure. Your health care provider may also give you more specific instructions. Your treatment has been planned according to current medical practices, but problems sometimes occur. Call your health care provider if you have any problems or questions after your procedure. What can I expect after the procedure? After the procedure, it is common to have:  A sore throat.  Discomfort in your shoulder.  Mild discomfort or cramping in your abdomen.  Gas pains.  Pain or soreness in the area where the surgical cut (incision) was made.  A bloated feeling.  Tiredness.  Nausea.  Vomiting. Follow these instructions at home: Medicines  Take over-the-counter and prescription medicines only as told by your health care provider.  Do not take aspirin because it can cause bleeding.  Do not drive or operate heavy machinery while taking prescription pain medicine. Activity  Rest for the rest of the day.  Return to your normal activities as told by your health care provider. Ask your health care provider what activities are safe for you. Incision care      Follow instructions from your health care provider about how to take care of your incision. Make sure you: ? Wash your hands with soap and water before you change your bandage (dressing). If soap and water are not available, use hand sanitizer. ? Change your dressing as told by your health care provider. ? Leave stitches (sutures) in place. They may need to stay in place for 2 weeks or longer.  Check your incision area every day for signs of infection. Check for: ? More redness, swelling, or pain. ? More fluid or blood. ? Warmth. ? Pus or a bad smell. Other Instructions  Do not take baths, swim, or use a hot tub until your health care provider approves. You may take  showers.  Keep all follow-up visits as told by your health care provider. This is important.  Have someone help you with your daily household tasks for the first few days. Contact a health care provider if:  You have more redness, swelling, or pain around your incision.  Your incision feels warm to the touch.  You have pus or a bad smell coming from your incision.  The edges of your incision break open after the sutures have been removed.  Your pain does not improve after 2-3 days.  You have a rash.  You repeatedly become dizzy or light-headed.  Your pain medicine is not helping.  You are constipated. Get help right away if:  You have a fever.  You faint.  You have increasing pain in your abdomen.  You have severe pain in one or both of your shoulders.  You have fluid or blood coming from your sutures or from your vagina.  You have shortness of breath or difficulty breathing.  You have chest pain or leg pain.  You have ongoing nausea, vomiting, or diarrhea. This information is not intended to replace advice given to you by your health care provider. Make sure you discuss any questions you have with your health care provider. Document Released: 08/25/2004 Document Revised: 10/02/2016 Document Reviewed: 01/16/2015 Elsevier Interactive Patient Education  2019 [employer].  Next dose Aleve, Motrin,Ibuprofen after 6 pm   Post Anesthesia Home Care Instructions  Activity: Get plenty of rest for the remainder of the day. A responsible individual must stay with you for  24 hours following the procedure.  For the next 24 hours, DO NOT: -Drive a car -Paediatric nurse -Drink alcoholic beverages -Take any medication unless instructed by your physician -Make any legal decisions or sign important papers.  Meals: Start with liquid foods such as gelatin or soup. Progress to regular foods as tolerated. Avoid greasy, spicy, heavy foods. If nausea and/or vomiting occur,  drink only clear liquids until the nausea and/or vomiting subsides. Call your physician if vomiting continues.  Special Instructions/Symptoms: Your throat may feel dry or sore from the anesthesia or the breathing tube placed in your throat during surgery. If this causes discomfort, gargle with warm salt water. The discomfort should disappear within 24 hours.  If you had a scopolamine patch placed behind your ear for the management of post- operative nausea and/or vomiting:  1. The medication in the patch is effective for 72 hours, after which it should be removed.  Wrap patch in a tissue and discard in the trash. Wash hands thoroughly with soap and water. 2. You may remove the patch earlier than 72 hours if you experience unpleasant side effects which may include dry mouth, dizziness or visual disturbances. 3. Avoid touching the patch. Wash your hands with soap and water after contact with the patch.

## 2018-03-26 NOTE — H&P (Signed)
Christine Best is a 43 y.o. female , originally referred to me by [facility], for robotic assisted myomectomy.  She was diagnosed with recurrent fibroids because of abnormal uterine bleeding and was placed on NSAIDS.  She has been having monthly periods but with heavy flow and prolonged duration.  Patient would like to preserve her childbearing potential.  Pertinent Gynecological History: Menses: flow is excessive with use of 3 pads or tampons on heaviest days Bleeding: dysfunctional uterine bleeding Contraception: none DES exposure: denies Blood transfusions: none Sexually transmitted diseases: no past history Last pap: normal    Menstrual History: Menarche age: 4    Past Medical History:  Diagnosis Date  . Anemia   . Constipation   . DM (diabetes mellitus) ([place])    type 2  . HTN (hypertension)   . Infertility, female                     Past Surgical History:  Procedure Laterality Date  . MYOMECTOMY  APROX 2010   DR Lahoma Crocker              Family History  Problem Relation Age of Onset  . Diabetes Mother   . Hypertension Mother   . Cancer Father   . Diabetes Brother   . Cancer Maternal Aunt        type unknown   No hereditary disease.  No cancer of breast, ovary, uterus. No cutaneous leiomyomatosis or renal cell carcinoma.  Social History   Socioeconomic History  . Marital status: Single    Spouse name: Not on file  . Number of children: 0  . Years of education: Not on file  . Highest education level: Not on file  Occupational History  . Occupation: Engineer, mining  Social Needs  . Financial resource strain: Not on file  . Food insecurity:    Worry: Not on file    Inability: Not on file  . Transportation needs:    Medical: Not on file    Non-medical: Not on file  Tobacco Use  . Smoking status: Never Smoker  . Smokeless tobacco: Never Used  Substance and Sexual Activity  . Alcohol use: Yes    Alcohol/week: 0.0 standard drinks     Comment: occasional  . Drug use: No  . Sexual activity: Yes    Birth control/protection: None  Lifestyle  . Physical activity:    Days per week: Not on file    Minutes per session: Not on file  . Stress: Not on file  Relationships  . Social connections:    Talks on phone: Not on file    Gets together: Not on file    Attends religious service: Not on file    Active member of club or organization: Not on file    Attends meetings of clubs or organizations: Not on file    Relationship status: Not on file  . Intimate partner violence:    Fear of current or ex partner: Not on file    Emotionally abused: Not on file    Physically abused: Not on file    Forced sexual activity: Not on file  Other Topics Concern  . Not on file  Social History Narrative  . Not on file    Allergies  Allergen Reactions  . Lisinopril Swelling and Other (See Comments)    angioedema    No current facility-administered medications on file prior to encounter.    Current Outpatient Medications on File  Prior to Encounter  Medication Sig Dispense Refill  . hydrochlorothiazide (HYDRODIURIL) 25 MG tablet Take 1 tablet (25 mg total) by mouth daily. 90 tablet 3  . lubiprostone (AMITIZA) 24 MCG capsule Take 1 capsule (24 mcg total) by mouth 2 (two) times daily with a meal. 60 capsule 3  . metFORMIN (GLUCOPHAGE-XR) 750 MG 24 hr tablet TAKE 1 TABLET BY MOUTH ONCE DAILY WITH BREAKFAST 90 tablet 1  . NONFORMULARY OR COMPOUNDED ITEM Place 1 application rectally daily as needed (for irritation). Nitroglycerin 1.25% Cream    . Vitamin D, Ergocalciferol, (DRISDOL) 50000 units CAPS capsule Take 1 capsule (50,000 Units total) by mouth every 7 (seven) days. (Patient not taking: Reported on 12/25/2017) 4 capsule 0     Review of Systems  Constitutional: Negative.   HENT: Negative.   Eyes: Negative.   Respiratory: Negative.   Cardiovascular: Negative.   Gastrointestinal: Negative.   Genitourinary: Negative.    Musculoskeletal: Negative.   Skin: Negative.   Neurological: Negative.   Endo/Heme/Allergies: Negative.   Psychiatric/Behavioral: Negative.      Physical Exam  BP (!) 143/95   Pulse 94   Temp 99 F (37.2 C) (Oral)   Resp 16   Ht 5\' 2"  (1.575 m)   Wt 112.9 kg   LMP 03/01/2018   SpO2 97%   BMI 45.51 kg/m  Constitutional: She is oriented to person, place, and time. She appears well-developed and well-nourished.  HENT:  Head: Normocephalic and atraumatic.  Nose: Nose normal.  Mouth/Throat: Oropharynx is clear and moist. No oropharyngeal exudate.  Eyes: Conjunctivae normal and EOM are normal. Pupils are equal, round, and reactive to light. No scleral icterus.  Neck: Normal range of motion. Neck supple. No tracheal deviation present. No thyromegaly present.  Cardiovascular: Normal rate.   Respiratory: Effort normal and breath sounds normal.  GI: Soft. Bowel sounds are normal. She exhibits no distension and no mass. There is no tenderness.  Lymphadenopathy:    She has no cervical adenopathy.  Neurological: She is alert and oriented to person, place, and time. She has normal reflexes.  Skin: Skin is warm.  Psychiatric: She has a normal mood and affect. Her behavior is normal. Judgment and thought content normal.       Assessment/Plan:  Intramural and subserosal uterine myomas, causing menorrhagia and pressure sensation. Preoperative for robot assisted myomectomy Benefits and risks of robotic myomectomy were discussed with the patient and her family member again.  Bowel prep instructions were given.  All of patient's questions were answered.  She verbalized understanding.  She knows that she will need a cesarean delivery for future pregnancies, and that it is recommended she does not conceive for 2-3 months for uterus to heal.

## 2018-03-26 NOTE — OR Nursing (Signed)
Boyfriend updated on surgical progress in waiting room via front desk personnel per C.Erling Cruz

## 2018-03-26 NOTE — Op Note (Signed)
OPERATIVE NOTE  Preoperative diagnosis:  Recurrent uterine myomas, pelvic pain and menorrhagia  Postoperative diagnosis: Recurrent uterine myomas, intramural, subserosal and intraligamentary, Stage I deep endometriosis of posterior culdesac, pelvic pain and menorrhagia   Procedure: Laparoscopy, robotic assisted myomectomy ( x 10) , excision and ablation of endometriosis, chromotubation, intraoperative transvaginal ultrasound guidance  Anesthesia: Gen. endotracheal   Surgeon: Governor Specking   Assistant: Sullivan Lone, RNFA  Complications: None   Estimated blood loss: 400 mL  Specimens: Myoma specimens, peritoneal excisional biopsies to pathology.   Findings: On exam under anesthesia, uterus was multinodular and enlarged to 14-15 wk size. Posterior fornix nodularity was palpable on the right. No adnexal masses were palpable.  On laparoscopy, there were dense adhesions to left upper quadrant. Liver edge, gallbladder, diaphragm surfaces and appendix were normal.  There were two 4-5 cm intramural myomas in anterior lower uterine segment and posterior wall of the uterus. A 3x3 cm pedunculated myoma was on the left utero-ovarian ligament. A 4x3 cm left lower broad ligament myoma was among the ones that were removed. Some myomas were in multiple layers so we used ultrasound guidance intraop in order not to miss any deep myometrial lesions. Altogether 10 myomas ranging in size from 5 cm down to 2 cm were removed. Both fallopian tubes were normal with healthy fimbriae and patency to chromotubation.  Left ovary was small and right ovary was normal size with a corpus luteum. There was a 2 x 2 cm rather fibrotic and deep infiltrating nodule of endometriosis at the insertion of right uterosacral ligament to the uterus.   Description of the procedure: Patient was placed in lithotomy position and general endotracheal anesthesia was given. 2 gm of Kefzol were given intravenously for prophylaxis. She was  prepped and draped in sterile manner. A Foley catheter was inserted into the bladder appeared . A ZUMI uterine manipulator was inserted into the uterus for uterine manipulation and chromotubation. The uterus sounded to 8 cm.   An operative field was created on the abdomen and the surgeon was regloved. After preemptive anesthesia with quarter percent bupivacaine and 1:200,000 epinephrine, and supraumbilical skin incision was made at previous scar. A Verress needle was inserted and pneumoperitoneum was created with carbon dioxide. An 8 mm trocar was placed at this incision. Robotic laparoscope with 3-D camera was inserted and, under direct visualization, 2 left lateral 19mm incisions were made at pervious scars and corresponding robotic trochar and AirSeal trochar were placed. On the right side a third robotic trocar was placed. The [employer] XI robot was docked to the patient after placing her in Trendelenburg position. The surgeon continued the rest of the procedure from the surgical console.  A dilute vasopressin solution (0.33 units per milliliter) was injected transcutaneously into the myometrium of the uterus. Using monopolar scissors in cutting mode, incisions were made on the multiple myomas (2 anterior, 3 posterior, 1 left broad ligament, aside from the pedunculated myoma pedicles), and myomas were dissected using blunt and monopolar and bipolar [place] dissection. Hemostasis was insured. The myoma defects were closed 3 layers the first and second layers were a 2-0 V-lock continuous suture and the third layer was a 2-0 V-lock continuous suture. The subserosal myoma defects were closed in 2 layers with 2-0 V.-lock sutures.  We enlarged the left lower port site to 15 mm and morcellated the myomas carefully with a Elba Barman power morcellator and brought them out. Fragments were carefully picked up also. Next we dissected the large right uterosacral deep  nodule of endometriosis with electrosurgical scissors,  staying away from the course of the ureter. At this point the procedure was terminated hemostasis was insured. Instrument and lap pad count was correct. Pelvis was copiously irrigated with lactated Ringer solution and a slurry of Seprafilm (2 sheets in 60 mL of lactated Ringer solution) was instilled into the pelvis as an adhesion barrier. The 15 mm port site fascia was closed with 0 Vicryl with the help of a Donnamarie Rossetti device. The skin incisions were approximated with 4-0 Monocryl in subcuticular stitches.    The patient tolerated the procedure well and was transferred to recovery room in satisfactory condition.  Special Note: Due to to significant myometrial incisions, the patient is recommended to have cesarean delivery with future pregnancies  [facility]

## 2018-03-26 NOTE — Anesthesia Preprocedure Evaluation (Addendum)
Anesthesia Evaluation  Patient identified by MRN, date of birth, ID band Patient awake    Reviewed: Allergy & Precautions, NPO status , Patient's Chart, lab work & pertinent test results  History of Anesthesia Complications Negative for: history of anesthetic complications  Airway Mallampati: II  TM Distance: >3 FB Neck ROM: Full    Dental no notable dental hx. (+) Teeth Intact   Pulmonary Recent URI ,    Pulmonary exam normal breath sounds clear to auscultation       Cardiovascular hypertension, Pt. on medications Normal cardiovascular exam Rhythm:Regular Rate:Normal     Neuro/Psych negative neurological ROS  negative psych ROS   GI/Hepatic negative GI ROS, Neg liver ROS,   Endo/Other  diabetes, Well Controlled, Type 2, Oral Hypoglycemic AgentsMorbid obesity    Renal/GU negative Renal ROS  negative genitourinary   Musculoskeletal negative musculoskeletal ROS (+)   Abdominal (+) + obese,   Peds  Hematology  (+) anemia ,   Anesthesia Other Findings   Reproductive/Obstetrics Uterine leiomyoma                            Anesthesia Physical Anesthesia Plan  ASA: III  Anesthesia Plan: General   Post-op Pain Management:    Induction: Intravenous  PONV Risk Score and Plan: 4 or greater and Treatment may vary due to age or medical condition, Ondansetron, Scopolamine patch - Pre-op, Midazolam and Dexamethasone  Airway Management Planned: Oral ETT  Additional Equipment: None  Intra-op Plan:   Post-operative Plan: Extubation in OR  Informed Consent: I have reviewed the patients History and Physical, chart, labs and discussed the procedure including the risks, benefits and alternatives for the proposed anesthesia with the patient or authorized representative who has indicated his/her understanding and acceptance.     Dental advisory given  Plan Discussed with: CRNA,  Anesthesiologist and Surgeon  Anesthesia Plan Comments:        Anesthesia Quick Evaluation

## 2018-03-26 NOTE — Transfer of Care (Signed)
Immediate Anesthesia Transfer of Care Note  Patient: Christine Best  Procedure(s) Performed: XI ROBOTIC ASSISTED LAPAROSCOPIC MYOMECTOMY WITH TRANSVAGINAL ULTRASOUND GUIDANCE AND EXCISION OF ENDOMETRIOSIS (N/A Abdomen)  Patient Location: PACU  Anesthesia Type:General  Level of Consciousness: drowsy  Airway & Oxygen Therapy: Patient Spontanous Breathing and Patient connected to nasal cannula oxygen  Post-op Assessment: Report given to RN  Post vital signs: Reviewed and stable  Last Vitals:  Vitals Value Taken Time  BP 129/76 03/26/2018  6:43 PM  Temp    Pulse 99 03/26/2018  6:44 PM  Resp    SpO2 100 % 03/26/2018  6:44 PM  Vitals shown include unvalidated device data.  Last Pain:  Vitals:   03/26/18 1128  TempSrc:   PainSc: 0-No pain      Patients Stated Pain Goal: 5 (73/22/02 5427)  Complications: No apparent anesthesia complications

## 2018-03-26 NOTE — Anesthesia Procedure Notes (Signed)
Procedure Name: Intubation Date/Time: 03/26/2018 1:31 PM Performed by: Bonney Aid, CRNA Pre-anesthesia Checklist: Patient identified, Emergency Drugs available, Suction available and Patient being monitored Patient Re-evaluated:Patient Re-evaluated prior to induction Oxygen Delivery Method: Circle system utilized Preoxygenation: Pre-oxygenation with 100% oxygen Induction Type: IV induction Ventilation: Mask ventilation without difficulty Laryngoscope Size: Mac and 4 Grade View: Grade I Tube type: Oral Tube size: 7.0 mm Number of attempts: 1 Airway Equipment and Method: Stylet Placement Confirmation: ETT inserted through vocal cords under direct vision,  positive ETCO2 and breath sounds checked- equal and bilateral Secured at: 20 cm Tube secured with: Tape Dental Injury: Teeth and Oropharynx as per pre-operative assessment

## 2018-03-26 NOTE — OR Nursing (Signed)
U/S to room for transvaginal ultrasound per Dr. Kerin Perna

## 2018-03-28 ENCOUNTER — Encounter (HOSPITAL_BASED_OUTPATIENT_CLINIC_OR_DEPARTMENT_OTHER): Payer: Self-pay | Admitting: Obstetrics and Gynecology

## 2018-03-28 NOTE — Anesthesia Postprocedure Evaluation (Signed)
Anesthesia Post Note  Patient: Christine Best  Procedure(s) Performed: XI ROBOTIC ASSISTED LAPAROSCOPIC MYOMECTOMY WITH TRANSVAGINAL ULTRASOUND GUIDANCE AND EXCISION OF ENDOMETRIOSIS (N/A Abdomen)     Patient location during evaluation: PACU Anesthesia Type: General Level of consciousness: awake and alert Pain management: pain level controlled Vital Signs Assessment: post-procedure vital signs reviewed and stable Respiratory status: spontaneous breathing, nonlabored ventilation, respiratory function stable and patient connected to nasal cannula oxygen Cardiovascular status: blood pressure returned to baseline and stable Postop Assessment: no apparent nausea or vomiting Anesthetic complications: no    Last Vitals:  Vitals:   03/26/18 1945 03/26/18 2050  BP: (!) 107/55 120/74  Pulse: 92 93  Resp: 14 14  Temp:  36.9 C  SpO2: 94% 95%    Last Pain:  Vitals:   03/27/18 1140  TempSrc:   PainSc: 10-Worst pain ever                 Effie Berkshire

## 2018-04-01 MED FILL — metFORMIN HCL ER 750 MG TB2: 750 | 90 days supply | Qty: 90 | Fill #1

## 2018-04-01 MED FILL — AMITIZA 24 MCG CAPSULES: 24 | 30 days supply | Qty: 60 | Fill #1

## 2018-04-16 DIAGNOSIS — D251 Intramural leiomyoma of uterus: Secondary | ICD-10-CM | POA: Diagnosis not present

## 2018-04-16 DIAGNOSIS — Z3183 Encounter for assisted reproductive fertility procedure cycle: Secondary | ICD-10-CM | POA: Diagnosis not present

## 2018-04-17 MED FILL — CETROTIDE 0.25 MG KIT: 0.25 | 6 days supply | Qty: 6 | Fill #0

## 2018-04-17 MED FILL — PROGESTERONE OIL 50 MG/ML V: 50 | 30 days supply | Qty: 30 | Fill #0

## 2018-04-17 MED FILL — DOXYCYCLINE HYCLATE 100 MG: 100 | 10 days supply | Qty: 20 | Fill #0

## 2018-04-17 MED FILL — MENOPUR 75 UNIT VIAL: 75 | 30 days supply | Qty: 30 | Fill #0

## 2018-04-17 MED FILL — GONAL-F 1,050 UNITS VIAL: 1050 | 4 days supply | Qty: 4 | Fill #0

## 2018-04-17 MED FILL — ESTRADIOL 2 MG TABLET: 2 | 30 days supply | Qty: 60 | Fill #0

## 2018-04-17 MED FILL — METHYLPREDNISOLONE 4 MG TAB: 4 | 4 days supply | Qty: 16 | Fill #0

## 2018-04-17 MED FILL — NOVAREL 10,000 UNITS VIAL: 10000 | 1 days supply | Qty: 1 | Fill #0

## 2018-04-17 MED FILL — DOTTI 0.1 MG/24HR PTTW: 0.1 | 28 days supply | Qty: 8 | Fill #0

## 2018-04-22 DIAGNOSIS — N979 Female infertility, unspecified: Secondary | ICD-10-CM | POA: Diagnosis not present

## 2018-04-22 DIAGNOSIS — Z3183 Encounter for assisted reproductive fertility procedure cycle: Secondary | ICD-10-CM | POA: Diagnosis not present

## 2018-04-24 DIAGNOSIS — N979 Female infertility, unspecified: Secondary | ICD-10-CM | POA: Diagnosis not present

## 2018-04-24 DIAGNOSIS — Z3183 Encounter for assisted reproductive fertility procedure cycle: Secondary | ICD-10-CM | POA: Diagnosis not present

## 2018-04-26 DIAGNOSIS — Z3183 Encounter for assisted reproductive fertility procedure cycle: Secondary | ICD-10-CM | POA: Diagnosis not present

## 2018-04-26 DIAGNOSIS — N979 Female infertility, unspecified: Secondary | ICD-10-CM | POA: Diagnosis not present

## 2018-04-29 DIAGNOSIS — Z3183 Encounter for assisted reproductive fertility procedure cycle: Secondary | ICD-10-CM | POA: Diagnosis not present

## 2018-04-29 DIAGNOSIS — N979 Female infertility, unspecified: Secondary | ICD-10-CM | POA: Diagnosis not present

## 2018-05-01 DIAGNOSIS — N979 Female infertility, unspecified: Secondary | ICD-10-CM | POA: Diagnosis not present

## 2018-05-01 DIAGNOSIS — Z3183 Encounter for assisted reproductive fertility procedure cycle: Secondary | ICD-10-CM | POA: Diagnosis not present

## 2018-05-03 DIAGNOSIS — Z3183 Encounter for assisted reproductive fertility procedure cycle: Secondary | ICD-10-CM | POA: Diagnosis not present

## 2018-05-03 DIAGNOSIS — N979 Female infertility, unspecified: Secondary | ICD-10-CM | POA: Diagnosis not present

## 2018-05-05 DIAGNOSIS — Z3141 Encounter for fertility testing: Secondary | ICD-10-CM | POA: Diagnosis not present

## 2018-05-05 DIAGNOSIS — N8500 Endometrial hyperplasia, unspecified: Secondary | ICD-10-CM | POA: Diagnosis not present

## 2018-05-05 DIAGNOSIS — Z3183 Encounter for assisted reproductive fertility procedure cycle: Secondary | ICD-10-CM | POA: Diagnosis not present

## 2018-05-06 DIAGNOSIS — Z760 Encounter for issue of repeat prescription: Secondary | ICD-10-CM | POA: Diagnosis not present

## 2018-05-08 MED FILL — HYDROCHLOROTHIAZIDE 25 MG T: 25 | 90 days supply | Qty: 90 | Fill #2

## 2018-05-08 MED FILL — AMITIZA 24 MCG CAPSULES: 24 | 30 days supply | Qty: 60 | Fill #2

## 2018-05-10 DIAGNOSIS — Z3183 Encounter for assisted reproductive fertility procedure cycle: Secondary | ICD-10-CM | POA: Diagnosis not present

## 2018-05-27 DIAGNOSIS — Z3183 Encounter for assisted reproductive fertility procedure cycle: Secondary | ICD-10-CM | POA: Diagnosis not present

## 2018-06-09 MED FILL — AMITIZA 24 MCG CAPSULES: 24 | 30 days supply | Qty: 60 | Fill #3

## 2018-06-16 ENCOUNTER — Other Ambulatory Visit: Payer: Self-pay

## 2018-06-16 DIAGNOSIS — Z0184 Encounter for antibody response examination: Secondary | ICD-10-CM

## 2018-06-16 DIAGNOSIS — Z789 Other specified health status: Principal | ICD-10-CM

## 2018-06-18 ENCOUNTER — Ambulatory Visit (INDEPENDENT_AMBULATORY_CARE_PROVIDER_SITE_OTHER): Payer: 59 | Admitting: Family Medicine

## 2018-06-18 ENCOUNTER — Other Ambulatory Visit: Payer: Self-pay

## 2018-06-18 DIAGNOSIS — Z0184 Encounter for antibody response examination: Secondary | ICD-10-CM

## 2018-06-18 DIAGNOSIS — Z789 Other specified health status: Secondary | ICD-10-CM

## 2018-06-18 NOTE — Addendum Note (Signed)
Addended by: Ileana Roup on: 06/18/2018 03:21 PM   Modules accepted: Orders

## 2018-06-19 DIAGNOSIS — I10 Essential (primary) hypertension: Secondary | ICD-10-CM | POA: Diagnosis not present

## 2018-06-19 DIAGNOSIS — Z789 Other specified health status: Secondary | ICD-10-CM | POA: Diagnosis not present

## 2018-06-19 NOTE — Addendum Note (Signed)
Addended by: Delia Chimes A on: 06/19/2018 08:25 AM   Modules accepted: Orders

## 2018-06-19 NOTE — Addendum Note (Signed)
Addended by: Ileana Roup on: 06/19/2018 08:26 AM   Modules accepted: Orders

## 2018-06-23 ENCOUNTER — Other Ambulatory Visit: Payer: Self-pay

## 2018-06-23 ENCOUNTER — Ambulatory Visit (INDEPENDENT_AMBULATORY_CARE_PROVIDER_SITE_OTHER): Payer: 59 | Admitting: Family Medicine

## 2018-06-23 ENCOUNTER — Encounter: Payer: Self-pay | Admitting: Family Medicine

## 2018-06-23 VITALS — BP 132/70 | HR 105 | Temp 98.9°F | Resp 16 | Ht 62.0 in | Wt 255.8 lb

## 2018-06-23 DIAGNOSIS — Z029 Encounter for administrative examinations, unspecified: Secondary | ICD-10-CM

## 2018-06-23 DIAGNOSIS — I10 Essential (primary) hypertension: Secondary | ICD-10-CM

## 2018-06-23 NOTE — Progress Notes (Signed)
Established Patient Office Visit  Subjective:  Patient ID: Christine Best, female    DOB: 1975-09-06  Age: 43 y.o. MRN: [id number]  CC:  Chief Complaint  Patient presents with  . Annual Exam    needs polio titer    HPI Christine Best presents for form completion for clearance for taking a class for nursing school.  Past Medical History:  Diagnosis Date  . Anemia   . Constipation   . DM (diabetes mellitus) ([place])    type 2  . HTN (hypertension)   . Infertility, female     Past Surgical History:  Procedure Laterality Date  . MYOMECTOMY  APROX 2010   DR [facility]   . ROBOT ASSISTED MYOMECTOMY N/A 03/26/2018   Procedure: XI ROBOTIC ASSISTED LAPAROSCOPIC MYOMECTOMY WITH TRANSVAGINAL ULTRASOUND GUIDANCE AND EXCISION OF ENDOMETRIOSIS;  Surgeon: Governor Specking, MD;  Location: [facility];  Service: Gynecology;  Laterality: N/A;    Family History  Problem Relation Age of Onset  . Diabetes Mother   . Hypertension Mother   . Cancer Father   . Diabetes Brother   . Cancer Maternal Aunt        type unknown    Social History   Socioeconomic History  . Marital status: Single    Spouse name: Not on file  . Number of children: 0  . Years of education: Not on file  . Highest education level: Not on file  Occupational History  . Occupation: Engineer, mining  Social Needs  . Financial resource strain: Not on file  . Food insecurity:    Worry: Not on file    Inability: Not on file  . Transportation needs:    Medical: Not on file    Non-medical: Not on file  Tobacco Use  . Smoking status: Never Smoker  . Smokeless tobacco: Never Used  Substance and Sexual Activity  . Alcohol use: Yes    Alcohol/week: 0.0 standard drinks    Comment: occasional  . Drug use: No  . Sexual activity: Yes    Birth control/protection: None  Lifestyle  . Physical activity:    Days per week: Not on file    Minutes per session: Not on file  . Stress: Not on file   Relationships  . Social connections:    Talks on phone: Not on file    Gets together: Not on file    Attends religious service: Not on file    Active member of club or organization: Not on file    Attends meetings of clubs or organizations: Not on file    Relationship status: Not on file  . Intimate partner violence:    Fear of current or ex partner: Not on file    Emotionally abused: Not on file    Physically abused: Not on file    Forced sexual activity: Not on file  Other Topics Concern  . Not on file  Social History Narrative  . Not on file    Outpatient Medications Prior to Visit  Medication Sig Dispense Refill  . hydrochlorothiazide (HYDRODIURIL) 25 MG tablet Take 1 tablet (25 mg total) by mouth daily. 90 tablet 3  . lubiprostone (AMITIZA) 24 MCG capsule Take 1 capsule (24 mcg total) by mouth 2 (two) times daily with a meal. 60 capsule 3  . metFORMIN (GLUCOPHAGE-XR) 750 MG 24 hr tablet TAKE 1 TABLET BY MOUTH ONCE DAILY WITH BREAKFAST 90 tablet 1  . NONFORMULARY OR COMPOUNDED ITEM Place  1 application rectally daily as needed (for irritation). Nitroglycerin 1.25% Cream    . ondansetron (ZOFRAN) 4 MG tablet Take 1 tablet (4 mg total) by mouth daily as needed for nausea or vomiting. (Patient not taking: Reported on 06/23/2018) 30 tablet 1  . oxyCODONE-acetaminophen (PERCOCET) 7.5-325 MG tablet Take 1 tablet by mouth every 4 (four) hours as needed. (Patient not taking: Reported on 06/23/2018) 20 tablet 0  . Vitamin D, Ergocalciferol, (DRISDOL) 50000 units CAPS capsule Take 1 capsule (50,000 Units total) by mouth every 7 (seven) days. (Patient not taking: Reported on 06/23/2018) 4 capsule 0   No facility-administered medications prior to visit.     Allergies  Allergen Reactions  . Lisinopril Swelling and Other (See Comments)    angioedema    ROS Review of Systems    Objective:    Physical Exam  BP 132/70 (BP Location: Right Arm, Patient Position: Sitting, Cuff Size: Large)    Pulse (!) 105   Temp 98.9 F (37.2 C) (Oral)   Resp 16   Ht 5\' 2"  (1.575 m)   Wt 255 lb 12.8 oz (116 kg)   LMP 06/16/2018   SpO2 99%   BMI 46.79 kg/m  Wt Readings from Last 3 Encounters:  06/23/18 255 lb 12.8 oz (116 kg)  03/26/18 248 lb 12.8 oz (112.9 kg)  01/01/18 245 lb (111.1 kg)   Physical Exam  Constitutional: Oriented to person, place, and time. Appears well-developed and well-nourished.  HENT:  Head: Normocephalic and atraumatic.  Eyes: Conjunctivae and EOM are normal.  Cardiovascular: Normal rate, regular rhythm, normal heart sounds and intact distal pulses.  No murmur heard. Pulmonary/Chest: Effort normal and breath sounds normal. No stridor. No respiratory distress. Has no wheezes.  Neurological: Is alert and oriented to person, place, and time.  Skin: Skin is warm. Capillary refill takes less than 2 seconds.  Psychiatric: Has a normal mood and affect. Behavior is normal. Judgment and thought content normal.    Health Maintenance Due  Topic Date Due  . URINE MICROALBUMIN  10/31/1987  . OPHTHALMOLOGY EXAM  05/21/2017    There are no preventive care reminders to display for this patient.  Lab Results  Component Value Date   TSH 1.530 09/03/2017   Lab Results  Component Value Date   WBC 7.1 03/21/2018   HGB 10.1 (L) 03/21/2018   HCT 35.3 (L) 03/21/2018   MCV 71.3 (L) 03/21/2018   PLT 469 (H) 03/21/2018   Lab Results  Component Value Date   NA 134 (L) 03/21/2018   K 3.8 03/21/2018   CO2 26 03/21/2018   GLUCOSE 99 03/21/2018   BUN 10 03/21/2018   CREATININE 0.78 03/21/2018   BILITOT 0.3 09/03/2017   ALKPHOS 80 09/03/2017   AST 14 09/03/2017   ALT 10 09/03/2017   PROT 6.8 09/03/2017   ALBUMIN 3.9 09/03/2017   CALCIUM 9.1 03/21/2018   ANIONGAP 9 03/21/2018   Lab Results  Component Value Date   CHOL 161 09/03/2017   Lab Results  Component Value Date   HDL 53 09/03/2017   Lab Results  Component Value Date   LDLCALC 97 09/03/2017   Lab  Results  Component Value Date   TRIG 55 09/03/2017   Lab Results  Component Value Date   CHOLHDL 3.2 04/03/2017   Lab Results  Component Value Date   HGBA1C 6.8 (H) 03/21/2018      Assessment & Plan:   Problem List Items Addressed This Visit    None  Visit Diagnoses    Administrative encounter    -  Primary      Completed form for nursing school   No orders of the defined types were placed in this encounter.   Follow-up: No follow-ups on file.    Forrest Moron, MD

## 2018-06-24 LAB — POLIOVIRUS (1,3) ABS, NEUTRALIZ.
Polio 1 Titer: >1:128 {titer}
Polio 3 Titer: >1:128 {titer}

## 2018-06-25 ENCOUNTER — Encounter: Payer: Self-pay | Admitting: Family Medicine

## 2018-06-26 ENCOUNTER — Encounter: Payer: Self-pay | Admitting: Family Medicine

## 2018-07-04 ENCOUNTER — Other Ambulatory Visit: Payer: Self-pay | Admitting: Family Medicine

## 2018-07-04 DIAGNOSIS — E119 Type 2 diabetes mellitus without complications: Secondary | ICD-10-CM

## 2018-07-04 MED FILL — metFORMIN HCL ER 750 MG TB2: 750 | 90 days supply | Qty: 90 | Fill #0

## 2018-07-28 ENCOUNTER — Other Ambulatory Visit: Payer: Self-pay | Admitting: Gastroenterology

## 2018-07-28 MED FILL — AMITIZA 24 MCG CAPSULES: 24 | 30 days supply | Qty: 60 | Fill #0

## 2018-07-30 ENCOUNTER — Encounter: Payer: Self-pay | Admitting: Family Medicine

## 2018-07-30 ENCOUNTER — Ambulatory Visit: Payer: 59 | Admitting: Family Medicine

## 2018-07-30 ENCOUNTER — Other Ambulatory Visit: Payer: Self-pay

## 2018-07-30 VITALS — BP 124/86 | HR 68 | Temp 98.5°F | Resp 16 | Ht 62.0 in | Wt 260.0 lb

## 2018-07-30 DIAGNOSIS — Z1239 Encounter for other screening for malignant neoplasm of breast: Secondary | ICD-10-CM | POA: Diagnosis not present

## 2018-07-30 DIAGNOSIS — E119 Type 2 diabetes mellitus without complications: Secondary | ICD-10-CM | POA: Diagnosis not present

## 2018-07-30 DIAGNOSIS — Z0000 Encounter for general adult medical examination without abnormal findings: Secondary | ICD-10-CM

## 2018-07-30 DIAGNOSIS — Z0001 Encounter for general adult medical examination with abnormal findings: Secondary | ICD-10-CM | POA: Diagnosis not present

## 2018-07-30 DIAGNOSIS — I10 Essential (primary) hypertension: Secondary | ICD-10-CM

## 2018-07-30 MED ORDER — NIFEDIPINE 20 MG PO CAPS: 20.0000 mg | ORAL_CAPSULE | Freq: Two times a day (BID) | ORAL | 3 refills | Status: DC

## 2018-07-30 MED ORDER — LUBIPROSTONE 24 MCG PO CAPS: ORAL_CAPSULE | ORAL | 3 refills | Status: DC

## 2018-07-30 NOTE — Progress Notes (Signed)
Chief Complaint  Patient presents with  . Annual Exam    no pap    Subjective:  Christine Best is a 43 y.o. female here for a health maintenance visit.  Patient is established pt  Hypertension: Patient here for follow-up of elevated blood pressure. She is not exercising and is adherent to low salt diet.  Blood pressure is well controlled at home. Cardiac symptoms none. Patient denies chest pain, claudication, exertional chest pressure/discomfort, fatigue and lower extremity edema.  Cardiovascular risk factors: hypertension, obesity (BMI >= 30 kg/m2) and sedentary lifestyle. Use of agents associated with hypertension: none. History of target organ damage: none. BP Readings from Last 3 Encounters:  07/30/18 124/86  06/23/18 132/70  03/26/18 120/74   She is taking HCTZ but is doing invitro and would like to transition to something safe in pregnancy   Patient Active Problem List   Diagnosis Date Noted  . Other fatigue 09/03/2017  . Shortness of breath on exertion 09/03/2017  . Type 2 diabetes mellitus without complication, without long-term current use of insulin ([place]) 09/03/2017  . Type 2 diabetes mellitus with complication ([place]) 20/25/4270  . Morbid obesity ([place]) 04/03/2017  . Essential hypertension 04/03/2017    Past Medical History:  Diagnosis Date  . Anemia   . Constipation   . DM (diabetes mellitus) ([place])    type 2  . HTN (hypertension)   . Infertility, female     Past Surgical History:  Procedure Laterality Date  . MYOMECTOMY  APROX 2010   DR [facility]   . ROBOT ASSISTED MYOMECTOMY N/A 03/26/2018   Procedure: XI ROBOTIC ASSISTED LAPAROSCOPIC MYOMECTOMY WITH TRANSVAGINAL ULTRASOUND GUIDANCE AND EXCISION OF ENDOMETRIOSIS;  Surgeon: Governor Specking, MD;  Location: [facility];  Service: Gynecology;  Laterality: N/A;     Outpatient Medications Prior to Visit  Medication Sig Dispense Refill  . hydrochlorothiazide (HYDRODIURIL) 25 MG tablet Take  1 tablet (25 mg total) by mouth daily. 90 tablet 3  . metFORMIN (GLUCOPHAGE-XR) 750 MG 24 hr tablet TAKE 1 TABLET BY MOUTH ONCE DAILY WITH BREAKFAST 90 tablet 1  . NONFORMULARY OR COMPOUNDED ITEM Place 1 application rectally daily as needed (for irritation). Nitroglycerin 1.25% Cream    . AMITIZA 24 MCG capsule TAKE 1 CAPSULE BY MOUTH 2 TIMES DAILY WITH A MEAL. 60 capsule 3  . ondansetron (ZOFRAN) 4 MG tablet Take 1 tablet (4 mg total) by mouth daily as needed for nausea or vomiting. (Patient not taking: Reported on 06/23/2018) 30 tablet 1  . oxyCODONE-acetaminophen (PERCOCET) 7.5-325 MG tablet Take 1 tablet by mouth every 4 (four) hours as needed. (Patient not taking: Reported on 06/23/2018) 20 tablet 0  . Vitamin D, Ergocalciferol, (DRISDOL) 50000 units CAPS capsule Take 1 capsule (50,000 Units total) by mouth every 7 (seven) days. (Patient not taking: Reported on 06/23/2018) 4 capsule 0   No facility-administered medications prior to visit.     Allergies  Allergen Reactions  . Lisinopril Swelling and Other (See Comments)    angioedema     Family History  Problem Relation Age of Onset  . Diabetes Mother   . Hypertension Mother   . Cancer Father   . Diabetes Brother   . Cancer Maternal Aunt        type unknown     Health Habits: Dental Exam: up to date Eye Exam: up to date Exercise: 0 times/week on average Current exercise activities: walking/running Diet:   Social History   Socioeconomic History  .  Marital status: Single    Spouse name: Not on file  . Number of children: 0  . Years of education: Not on file  . Highest education level: Not on file  Occupational History  . Occupation: Engineer, mining  Social Needs  . Financial resource strain: Not on file  . Food insecurity:    Worry: Not on file    Inability: Not on file  . Transportation needs:    Medical: Not on file    Non-medical: Not on file  Tobacco Use  . Smoking status: Never Smoker  . Smokeless tobacco:  Never Used  Substance and Sexual Activity  . Alcohol use: Yes    Alcohol/week: 0.0 standard drinks    Comment: occasional  . Drug use: No  . Sexual activity: Yes    Birth control/protection: None  Lifestyle  . Physical activity:    Days per week: Not on file    Minutes per session: Not on file  . Stress: Not on file  Relationships  . Social connections:    Talks on phone: Not on file    Gets together: Not on file    Attends religious service: Not on file    Active member of club or organization: Not on file    Attends meetings of clubs or organizations: Not on file    Relationship status: Not on file  . Intimate partner violence:    Fear of current or ex partner: Not on file    Emotionally abused: Not on file    Physically abused: Not on file    Forced sexual activity: Not on file  Other Topics Concern  . Not on file  Social History Narrative  . Not on file   Social History   Substance and Sexual Activity  Alcohol Use Yes  . Alcohol/week: 0.0 standard drinks   Comment: occasional   Social History   Tobacco Use  Smoking Status Never Smoker  Smokeless Tobacco Never Used   Social History   Substance and Sexual Activity  Drug Use No    GYN: Sexual Health Menstrual status: regular menses LMP: Patient's last menstrual period was 06/21/2018. Last pap smear: see HM section History of abnormal pap smears:  Sexually active: no Current contraception: none  Health Maintenance: See under health Maintenance activity for review of completion dates as well. Immunization History  Administered Date(s) Administered  . Influenza,inj,Quad PF,6+ Mos 10/15/2017  . Influenza-Unspecified 10/22/2016  . Pneumococcal Polysaccharide-23 08/02/2016  . Tdap 08/02/2016      Depression Screen-PHQ2/9 Depression screen [facility] 2/9 07/30/2018 06/23/2018 10/15/2017 09/03/2017 07/26/2017  Decreased Interest 0 0 0 0 0  Down, Depressed, Hopeless 0 0 0 0 0  PHQ - 2 Score 0 0 0 0 0  Altered  sleeping - - - 1 -  Tired, decreased energy - - - 1 -  Change in appetite - - - 0 -  Feeling bad or failure about yourself  - - - 0 -  Trouble concentrating - - - 0 -  Moving slowly or fidgety/restless - - - 0 -  Suicidal thoughts - - - 0 -  PHQ-9 Score - - - 2 -  Difficult doing work/chores - - - Not difficult at all -       Depression Severity and Treatment Recommendations:  0-4= None  5-9= Mild / Treatment: Support, educate to call if worse; return in one month  10-14= Moderate / Treatment: Support, watchful waiting; Antidepressant or Psycotherapy  15-19= Moderately severe /  Treatment: Antidepressant OR Psychotherapy  >= 20 = Major depression, severe / Antidepressant AND Psychotherapy    Review of Systems   ROS  See HPI for ROS as well.   Review of Systems  Constitutional: Negative for activity change, appetite change, chills and fever.  HENT: Negative for congestion, nosebleeds, trouble swallowing and voice change.   Respiratory: Negative for cough, shortness of breath and wheezing.   Gastrointestinal: Negative for diarrhea, nausea and vomiting.  Genitourinary: Negative for difficulty urinating, dysuria, flank pain and hematuria.  Musculoskeletal: Negative for back pain, joint swelling and neck pain.  Neurological: Negative for dizziness, speech difficulty, light-headedness and numbness.  See HPI. All other review of systems negative.    Objective:   Vitals:   07/30/18 0910  BP: 124/86  Pulse: 68  Resp: 16  Temp: 98.5 F (36.9 C)  TempSrc: Oral  SpO2: 97%  Weight: 260 lb (117.9 kg)  Height: 5\' 2"  (1.575 m)    Body mass index is 47.55 kg/m.  Physical Exam Constitutional:      Appearance: Normal appearance. She is obese.  HENT:     Head: Normocephalic and atraumatic.     Right Ear: Tympanic membrane normal.     Left Ear: Tympanic membrane normal.  Eyes:     Extraocular Movements: Extraocular movements intact.     Conjunctiva/sclera: Conjunctivae  normal.  Neck:     Musculoskeletal: Normal range of motion and neck supple.     Comments: No thyromegaly Cardiovascular:     Rate and Rhythm: Normal rate and regular rhythm.     Pulses: Normal pulses.     Heart sounds: No murmur.  Pulmonary:     Effort: Pulmonary effort is normal. No respiratory distress.     Breath sounds: Normal breath sounds. No stridor. No wheezing, rhonchi or rales.  Chest:     Chest wall: No tenderness.  Abdominal:     General: Bowel sounds are normal. There is no distension.     Palpations: Abdomen is soft. There is no mass.     Tenderness: There is no abdominal tenderness. There is no right CVA tenderness, left CVA tenderness, guarding or rebound.     Hernia: No hernia is present.  Musculoskeletal: Normal range of motion.        General: No swelling.  Skin:    General: Skin is warm.     Capillary Refill: Capillary refill takes less than 2 seconds.  Neurological:     Mental Status: She is alert.  Psychiatric:        Mood and Affect: Mood normal.        Behavior: Behavior normal.        Thought Content: Thought content normal.        Judgment: Judgment normal.        Assessment/Plan:   Patient was seen for a health maintenance exam.  Counseled the patient on health maintenance issues. Reviewed her health mainteance schedule and ordered appropriate tests (see orders.) Counseled on regular exercise and weight management. Recommend regular eye exams and dental cleaning.   The following issues were addressed today for health maintenance:   [place] was seen today for annual exam.  Diagnoses and all orders for this visit:  Encounter for health maintenance examination in adult- Women's Health Maintenance Plan Advised monthly breast exam and annual mammogram Advised dental exam every six months Discussed stress management Discussed pap smear screening guidelines    Essential hypertension- changed bp medication to one that is  category C and often  used in pregnancy She is not currently pregnant but will be doing IVF -     Basic metabolic panel -     CBC -     Microalbumin, urine -     NIFEdipine (PROCARDIA) 20 MG capsule; Take 1 capsule (20 mg total) by mouth 2 (two) times a day.  Type 2 diabetes mellitus without complication, without long-term current use of insulin ([place])- diabetes stable  -     CBC -     Microalbumin, urine -     Hemoglobin A1c -     Ambulatory referral to Ophthalmology  Morbid obesity ([place])- weight is continuing to uptrend  Screening for breast cancer -     MM Digital Screening; Future  Other orders -     lubiprostone (AMITIZA) 24 MCG capsule; TAKE 1 CAPSULE BY MOUTH 2 TIMES DAILY WITH A MEAL.    No follow-ups on file.    Body mass index is 47.55 kg/m.:  Discussed the patient's BMI with patient. The BMI body mass index is 47.55 kg/m.     No future appointments.  Patient Instructions      We recommend that you schedule a mammogram for breast cancer screening. Typically, you do not need a referral to do this. Please contact a local imaging center to schedule your mammogram.  [facility] - [phone]  *ask for the Radiology [place] ([place]) - [phone] or [phone]  [facility] - [phone] [place] [phone] [facility] - [phone]  *ask for the [facility] - [phone]  *ask for the Radiology Department [facility] - [phone]  *ask for the [place] - [phone]    If you have lab work done today you will be contacted with your lab results within the next 2 weeks.  If you have not heard from [place] then please contact us. The fastest way to get your results is to register for My Chart.   IF you received an x-ray today, you will receive an invoice from [facility] Radiology. Please contact [facility]  Radiology at [phone] with questions or concerns regarding your invoice.   IF you received labwork today, you will receive an invoice from [facility]. Please contact [facility] at [phone] with questions or concerns regarding your invoice.   Our billing staff will not be able to assist you with questions regarding bills from these companies.  You will be contacted with the lab results as soon as they are available. The fastest way to get your results is to activate your My Chart account. Instructions are located on the last page of this paperwork. If you have not heard from [place] regarding the results in 2 weeks, please contact this office.

## 2018-07-30 NOTE — Patient Instructions (Addendum)
    We recommend that you schedule a mammogram for breast cancer screening. Typically, you do not need a referral to do this. Please contact a local imaging center to schedule your mammogram.  [facility] - [phone]  *ask for the Radiology [place] - [phone] or [phone]  [facility] - [phone] [place] [phone] [facility] - [phone]  *ask for the [facility] - [phone]  *ask for the Radiology Department [facility] - [phone]  *ask for the [place] - [phone]    If you have lab work done today you will be contacted with your lab results within the next 2 weeks.  If you have not heard from [place] then please contact us. The fastest way to get your results is to register for My Chart.   IF you received an x-ray today, you will receive an invoice from [facility]. Please contact [facility] Radiology at [phone] with questions or concerns regarding your invoice.   IF you received labwork today, you will receive an invoice from [facility]. Please contact [facility] at [phone] with questions or concerns regarding your invoice.   Our billing staff will not be able to assist you with questions regarding bills from these companies.  You will be contacted with the lab results as soon as they are available. The fastest way to get your results is to activate your My Chart account. Instructions are located on the last page of this paperwork. If you have not heard from [place] regarding the results in 2 weeks, please contact this office.

## 2018-07-31 ENCOUNTER — Encounter: Payer: 59 | Admitting: Family Medicine

## 2018-07-31 LAB — CBC
Hematocrit: 36.2 % (ref 34.0–46.6)
Hemoglobin: 10.8 g/dL — ABNORMAL LOW (ref 11.1–15.9)
MCH: 21 pg — ABNORMAL LOW (ref 26.6–33.0)
MCHC: 29.8 g/dL — ABNORMAL LOW (ref 31.5–35.7)
MCV: 70 fL — ABNORMAL LOW (ref 79–97)
Platelets: 420 10*3/uL (ref 150–450)
RBC: 5.14 x10E6/uL (ref 3.77–5.28)
RDW: 17.1 % — ABNORMAL HIGH (ref 11.7–15.4)
WBC: 6.4 10*3/uL (ref 3.4–10.8)

## 2018-07-31 LAB — BASIC METABOLIC PANEL
BUN/Creatinine Ratio: 12 (ref 9–23)
BUN: 9 mg/dL (ref 6–24)
CO2: 22 mmol/L (ref 20–29)
Calcium: 9.7 mg/dL (ref 8.7–10.2)
Chloride: 99 mmol/L (ref 96–106)
Creatinine, Ser: 0.73 mg/dL (ref 0.57–1.00)
GFR calc Af Amer: 119 mL/min/{1.73_m2} (ref >59)
GFR calc non Af Amer: 103 mL/min/{1.73_m2} (ref >59)
Glucose: 101 mg/dL — ABNORMAL HIGH (ref 65–99)
Potassium: 4 mmol/L (ref 3.5–5.2)
Sodium: 137 mmol/L (ref 134–144)

## 2018-07-31 LAB — HEMOGLOBIN A1C
Est. average glucose Bld gHb Est-mCnc: 148 mg/dL
Hgb A1c MFr Bld: 6.8 % — ABNORMAL HIGH (ref 4.8–5.6)

## 2018-07-31 LAB — MICROALBUMIN, URINE: Microalbumin, Urine: <3 ug/mL

## 2018-08-07 MED FILL — HYDROCHLOROTHIAZIDE 25 MG T: 25 | 90 days supply | Qty: 90 | Fill #3

## 2018-08-07 MED FILL — NIFEdipine 20 MG CAPS: 20 | 30 days supply | Qty: 60 | Fill #0

## 2018-08-18 DIAGNOSIS — N979 Female infertility, unspecified: Secondary | ICD-10-CM | POA: Diagnosis not present

## 2018-08-18 DIAGNOSIS — Z3183 Encounter for assisted reproductive fertility procedure cycle: Secondary | ICD-10-CM | POA: Diagnosis not present

## 2018-08-18 DIAGNOSIS — Z319 Encounter for procreative management, unspecified: Secondary | ICD-10-CM | POA: Diagnosis not present

## 2018-08-25 DIAGNOSIS — D251 Intramural leiomyoma of uterus: Secondary | ICD-10-CM | POA: Diagnosis not present

## 2018-08-25 DIAGNOSIS — N8500 Endometrial hyperplasia, unspecified: Secondary | ICD-10-CM | POA: Diagnosis not present

## 2018-08-25 DIAGNOSIS — Z3141 Encounter for fertility testing: Secondary | ICD-10-CM | POA: Diagnosis not present

## 2018-08-26 MED FILL — DOXYCYCLINE HYCLATE 100 MG: 100 | 5 days supply | Qty: 10 | Fill #0

## 2018-09-01 MED FILL — AMITIZA 24 MCG CAPSULES: 24 | 30 days supply | Qty: 60 | Fill #1

## 2018-09-03 DIAGNOSIS — E119 Type 2 diabetes mellitus without complications: Secondary | ICD-10-CM | POA: Diagnosis not present

## 2018-09-03 LAB — HM DIABETES EYE EXAM

## 2018-09-18 ENCOUNTER — Other Ambulatory Visit: Payer: Self-pay

## 2018-09-18 ENCOUNTER — Ambulatory Visit
Admission: RE | Admit: 2018-09-18 | Discharge: 2018-09-18 | Disposition: A | Discharge status: Home / Self Care | Payer: 59 | Source: Ambulatory Visit | Attending: Family Medicine | Admitting: Family Medicine

## 2018-09-18 DIAGNOSIS — Z1231 Encounter for screening mammogram for malignant neoplasm of breast: Secondary | ICD-10-CM | POA: Diagnosis not present

## 2018-09-18 DIAGNOSIS — Z1239 Encounter for other screening for malignant neoplasm of breast: Secondary | ICD-10-CM

## 2018-09-22 MED FILL — NIFEdipine 20 MG CAPS: 20 | 30 days supply | Qty: 60 | Fill #1

## 2018-10-09 ENCOUNTER — Telehealth: Payer: Self-pay

## 2018-10-09 MED FILL — AMITIZA 24 MCG CAPSULES: 24 | 30 days supply | Qty: 60 | Fill #2

## 2018-10-09 NOTE — Telephone Encounter (Signed)
[facility] called to report that the metFORMIN (GLUCOPHAGE-XR) 750 MG 24 hr tablet   is on backorder and is unable to get it. Pharm Tech stated that is a statewide issue. Can you send her in a diff Rx? Please advise.

## 2018-10-10 MED ORDER — METFORMIN HCL 500 MG PO TABS: 500.0000 mg | ORAL_TABLET | Freq: Three times a day (TID) | ORAL | 1 refills | Status: DC

## 2018-10-10 MED FILL — metFORMIN HCL 500 MG TABS: 500 | 90 days supply | Qty: 270 | Fill #0

## 2018-10-10 NOTE — Telephone Encounter (Signed)
Please notify the patient and pharmacy of the medication change.  Since METFORMIN XR is on back order changed medication to Metformin 500mg  TID until there is a restock of the XR

## 2018-10-13 NOTE — Telephone Encounter (Signed)
Patient called and notified of recommendations per notes of Dr. Duane Lope. Understanding verbalized.   Spoke with Manuela Schwartz at [facility] and notified her of changes per notes of Dr. Nolon Rod.

## 2018-11-02 ENCOUNTER — Encounter: Payer: Self-pay | Admitting: Family Medicine

## 2018-11-04 ENCOUNTER — Other Ambulatory Visit: Payer: Self-pay

## 2018-11-04 ENCOUNTER — Encounter (HOSPITAL_COMMUNITY)
Admission: RE | Admit: 2018-11-04 | Discharge: 2018-11-04 | Disposition: A | Discharge status: Home / Self Care | Payer: 59 | Source: Ambulatory Visit | Attending: Obstetrics and Gynecology | Admitting: Obstetrics and Gynecology

## 2018-11-04 ENCOUNTER — Other Ambulatory Visit (HOSPITAL_COMMUNITY)
Admission: RE | Admit: 2018-11-04 | Discharge: 2018-11-04 | Disposition: A | Discharge status: Home / Self Care | Payer: 59 | Source: Ambulatory Visit | Attending: Obstetrics and Gynecology | Admitting: Obstetrics and Gynecology

## 2018-11-04 DIAGNOSIS — Z01818 Encounter for other preprocedural examination: Secondary | ICD-10-CM | POA: Insufficient documentation

## 2018-11-04 DIAGNOSIS — Z20828 Contact with and (suspected) exposure to other viral communicable diseases: Secondary | ICD-10-CM | POA: Insufficient documentation

## 2018-11-04 LAB — CBC
HCT: 33.4 % — ABNORMAL LOW (ref 36.0–46.0)
Hemoglobin: 10.1 g/dL — ABNORMAL LOW (ref 12.0–15.0)
MCH: 21.5 pg — ABNORMAL LOW (ref 26.0–34.0)
MCHC: 30.2 g/dL (ref 30.0–36.0)
MCV: 71.2 fL — ABNORMAL LOW (ref 80.0–100.0)
Platelets: 430 10*3/uL — ABNORMAL HIGH (ref 150–400)
RBC: 4.69 MIL/uL (ref 3.87–5.11)
RDW: 17.1 % — ABNORMAL HIGH (ref 11.5–15.5)
WBC: 8.5 10*3/uL (ref 4.0–10.5)
nRBC: 0 % (ref 0.0–0.2)

## 2018-11-04 LAB — BASIC METABOLIC PANEL
Anion gap: 9 (ref 5–15)
BUN: 9 mg/dL (ref 6–20)
CO2: 24 mmol/L (ref 22–32)
Calcium: 9 mg/dL (ref 8.9–10.3)
Chloride: 104 mmol/L (ref 98–111)
Creatinine, Ser: 0.67 mg/dL (ref 0.44–1.00)
GFR calc Af Amer: >60 mL/min (ref >60)
GFR calc non Af Amer: >60 mL/min (ref >60)
Glucose, Bld: 168 mg/dL — ABNORMAL HIGH (ref 70–99)
Potassium: 3.3 mmol/L — ABNORMAL LOW (ref 3.5–5.1)
Sodium: 137 mmol/L (ref 135–145)

## 2018-11-05 ENCOUNTER — Encounter (HOSPITAL_BASED_OUTPATIENT_CLINIC_OR_DEPARTMENT_OTHER): Payer: Self-pay | Admitting: *Deleted

## 2018-11-05 ENCOUNTER — Other Ambulatory Visit: Payer: Self-pay

## 2018-11-05 LAB — NOVEL CORONAVIRUS, NAA (HOSP ORDER, SEND-OUT TO REF LAB; TAT 18-24 HRS): SARS-CoV-2, NAA: NOT DETECTED

## 2018-11-05 NOTE — Progress Notes (Addendum)
Spoke w/ pt via phone for pre-op interview.  Npo after mn.  Arrive at 0900.  Needs urine preg.  Current lab results, covid, and ekg dated 11-04-2018 in chart and epic.  Will take procardia and amitiza am dos w/ sips of water.  Pt verbalized understanding do not take diabetic medication morning of surgery.     PCP -  dr Delia Chimes Cardiologist -  no  Chest x-ray -  no EKG -  11-04-2018 epic Stress Test -  no ECHO -  no Cardiac Cath -  no  Sleep Study -  no CPAP -   Fasting Blood Sugar -  130 Checks Blood Sugar ___0__ times a day.  Pt states does not check sugar on regular basis  Blood Thinner Instructions:  no Aspirin Instructions:  no Last Dose:  Anesthesia review:   pt hx htn and pelvic adhesions.  Pt BMI 47.55 (5'2" 260lb).  Previous surgery at [facility] 03-26-2018.    Patient denies shortness of breath, fever, cough and chest pain at phone interview.  ADDENDUM:  Chart reviewed by anesthesia,  Konrad Felix PA,  Ok to proceed.  Pt does need to come in for anesthesia evaluation.

## 2018-11-06 MED ORDER — DEXTROSE 5 % IV SOLN
3.0000 g | INTRAVENOUS | Status: AC
  Administered 2018-11-07: 3 g via INTRAVENOUS
  Filled 2018-11-06: qty 3000

## 2018-11-07 ENCOUNTER — Ambulatory Visit (HOSPITAL_BASED_OUTPATIENT_CLINIC_OR_DEPARTMENT_OTHER): Payer: 59 | Admitting: Anesthesiology

## 2018-11-07 ENCOUNTER — Encounter (HOSPITAL_BASED_OUTPATIENT_CLINIC_OR_DEPARTMENT_OTHER)
Admission: RE | Disposition: A | Discharge status: Home / Self Care | Payer: Self-pay | Source: Home / Self Care | Attending: Obstetrics and Gynecology

## 2018-11-07 ENCOUNTER — Encounter (HOSPITAL_BASED_OUTPATIENT_CLINIC_OR_DEPARTMENT_OTHER): Payer: Self-pay | Admitting: Emergency Medicine

## 2018-11-07 ENCOUNTER — Ambulatory Visit (HOSPITAL_BASED_OUTPATIENT_CLINIC_OR_DEPARTMENT_OTHER)
Admission: RE | Admit: 2018-11-07 | Discharge: 2018-11-07 | Disposition: A | Discharge status: Home / Self Care | Payer: 59 | Attending: Obstetrics and Gynecology | Admitting: Obstetrics and Gynecology

## 2018-11-07 ENCOUNTER — Other Ambulatory Visit: Payer: Self-pay

## 2018-11-07 ENCOUNTER — Ambulatory Visit (HOSPITAL_BASED_OUTPATIENT_CLINIC_OR_DEPARTMENT_OTHER): Payer: 59 | Admitting: Physician Assistant

## 2018-11-07 DIAGNOSIS — N920 Excessive and frequent menstruation with regular cycle: Secondary | ICD-10-CM | POA: Insufficient documentation

## 2018-11-07 DIAGNOSIS — Z79899 Other long term (current) drug therapy: Secondary | ICD-10-CM | POA: Diagnosis not present

## 2018-11-07 DIAGNOSIS — E119 Type 2 diabetes mellitus without complications: Secondary | ICD-10-CM | POA: Diagnosis not present

## 2018-11-07 DIAGNOSIS — Z6841 Body Mass Index (BMI) 40.0 and over, adult: Secondary | ICD-10-CM | POA: Diagnosis not present

## 2018-11-07 DIAGNOSIS — N800 Endometriosis of uterus: Secondary | ICD-10-CM | POA: Insufficient documentation

## 2018-11-07 DIAGNOSIS — N808 Other endometriosis: Secondary | ICD-10-CM | POA: Diagnosis not present

## 2018-11-07 DIAGNOSIS — Z7984 Long term (current) use of oral hypoglycemic drugs: Secondary | ICD-10-CM | POA: Insufficient documentation

## 2018-11-07 DIAGNOSIS — I10 Essential (primary) hypertension: Secondary | ICD-10-CM | POA: Insufficient documentation

## 2018-11-07 HISTORY — DX: Other constipation: K59.09

## 2018-11-07 HISTORY — DX: Type 2 diabetes mellitus without complications: E11.9

## 2018-11-07 HISTORY — DX: Leiomyoma of uterus, unspecified: D25.9

## 2018-11-07 HISTORY — DX: Female pelvic peritoneal adhesions (postinfective): N73.6

## 2018-11-07 HISTORY — DX: Presence of spectacles and contact lenses: Z97.3

## 2018-11-07 HISTORY — PX: HYSTEROSCOPY: SHX211

## 2018-11-07 LAB — GLUCOSE, CAPILLARY
Glucose-Capillary: 116 mg/dL — ABNORMAL HIGH (ref 70–99)
Glucose-Capillary: 103 mg/dL — ABNORMAL HIGH (ref 70–99)

## 2018-11-07 LAB — TYPE AND SCREEN
ABO/RH(D): O POS
Antibody Screen: NEGATIVE

## 2018-11-07 LAB — POCT PREGNANCY, URINE: Preg Test, Ur: NEGATIVE

## 2018-11-07 SURGERY — HYSTEROSCOPY
Anesthesia: General | Site: Vagina

## 2018-11-07 MED ORDER — DEXAMETHASONE SODIUM PHOSPHATE 10 MG/ML IJ SOLN
INTRAMUSCULAR | Status: AC
  Filled 2018-11-07: qty 1

## 2018-11-07 MED ORDER — HYDROMORPHONE HCL 1 MG/ML IJ SOLN
0.2500 mg | INTRAMUSCULAR | Status: DC | PRN
  Filled 2018-11-07: qty 0.5

## 2018-11-07 MED ORDER — LIDOCAINE 2% (20 MG/ML) 5 ML SYRINGE
INTRAMUSCULAR | Status: AC
  Filled 2018-11-07: qty 5

## 2018-11-07 MED ORDER — MIDAZOLAM HCL 5 MG/5ML IJ SOLN
INTRAMUSCULAR | Status: DC | PRN
  Administered 2018-11-07: 2 mg via INTRAVENOUS

## 2018-11-07 MED ORDER — GLYCOPYRROLATE 0.2 MG/ML IJ SOLN
INTRAMUSCULAR | Status: DC | PRN
  Administered 2018-11-07: 0.2 mg via INTRAVENOUS

## 2018-11-07 MED ORDER — OXYCODONE HCL 5 MG PO TABS
5.0000 mg | ORAL_TABLET | Freq: Once | ORAL | Status: DC | PRN
  Filled 2018-11-07: qty 1

## 2018-11-07 MED ORDER — KETOROLAC TROMETHAMINE 30 MG/ML IJ SOLN
INTRAMUSCULAR | Status: AC
  Filled 2018-11-07: qty 1

## 2018-11-07 MED ORDER — SODIUM CHLORIDE 0.9 % IR SOLN
Status: DC | PRN
  Administered 2018-11-07: 3000 mL

## 2018-11-07 MED ORDER — LACTATED RINGERS IV SOLN
INTRAVENOUS | Status: DC
  Administered 2018-11-07 (×2): via INTRAVENOUS
  Filled 2018-11-07: qty 1000

## 2018-11-07 MED ORDER — LIDOCAINE HCL (CARDIAC) PF 100 MG/5ML IV SOSY
PREFILLED_SYRINGE | INTRAVENOUS | Status: DC | PRN
  Administered 2018-11-07: 60 mg via INTRAVENOUS

## 2018-11-07 MED ORDER — PROPOFOL 10 MG/ML IV BOLUS
INTRAVENOUS | Status: DC | PRN
  Administered 2018-11-07: 200 mg via INTRAVENOUS

## 2018-11-07 MED ORDER — MIDAZOLAM HCL 2 MG/2ML IJ SOLN
INTRAMUSCULAR | Status: AC
  Filled 2018-11-07: qty 2

## 2018-11-07 MED ORDER — ONDANSETRON HCL 4 MG/2ML IJ SOLN
INTRAMUSCULAR | Status: AC
  Filled 2018-11-07: qty 2

## 2018-11-07 MED ORDER — KETOROLAC TROMETHAMINE 30 MG/ML IJ SOLN
30.0000 mg | Freq: Once | INTRAMUSCULAR | Status: DC | PRN
  Filled 2018-11-07: qty 1

## 2018-11-07 MED ORDER — ONDANSETRON HCL 4 MG/2ML IJ SOLN
INTRAMUSCULAR | Status: DC | PRN
  Administered 2018-11-07: 4 mg via INTRAVENOUS

## 2018-11-07 MED ORDER — DEXAMETHASONE SODIUM PHOSPHATE 4 MG/ML IJ SOLN
INTRAMUSCULAR | Status: DC | PRN
  Administered 2018-11-07: 5 mg via INTRAVENOUS

## 2018-11-07 MED ORDER — SCOPOLAMINE 1 MG/3DAYS TD PT72
1.0000 | MEDICATED_PATCH | TRANSDERMAL | Status: DC
  Administered 2018-11-07: 1.5 mg via TRANSDERMAL
  Filled 2018-11-07: qty 1

## 2018-11-07 MED ORDER — ACETAMINOPHEN 500 MG PO TABS
ORAL_TABLET | ORAL | Status: AC
  Filled 2018-11-07: qty 2

## 2018-11-07 MED ORDER — ACETAMINOPHEN 500 MG PO TABS
1000.0000 mg | ORAL_TABLET | Freq: Once | ORAL | Status: AC
  Administered 2018-11-07: 09:00:00 1000 mg via ORAL
  Filled 2018-11-07: qty 2

## 2018-11-07 MED ORDER — CEFAZOLIN SODIUM-DEXTROSE 2-4 GM/100ML-% IV SOLN
INTRAVENOUS | Status: AC
  Filled 2018-11-07: qty 100

## 2018-11-07 MED ORDER — CEFAZOLIN SODIUM-DEXTROSE 1-4 GM/50ML-% IV SOLN
INTRAVENOUS | Status: AC
  Filled 2018-11-07: qty 50

## 2018-11-07 MED ORDER — SCOPOLAMINE 1 MG/3DAYS TD PT72
MEDICATED_PATCH | TRANSDERMAL | Status: AC
  Filled 2018-11-07: qty 1

## 2018-11-07 MED ORDER — PROMETHAZINE HCL 25 MG/ML IJ SOLN
6.2500 mg | INTRAMUSCULAR | Status: DC | PRN
  Filled 2018-11-07: qty 1

## 2018-11-07 MED ORDER — FENTANYL CITRATE (PF) 100 MCG/2ML IJ SOLN
INTRAMUSCULAR | Status: AC
  Filled 2018-11-07: qty 2

## 2018-11-07 MED ORDER — VASOPRESSIN 20 UNIT/ML IV SOLN
INTRAVENOUS | Status: DC | PRN
  Administered 2018-11-07: 12:00:00 10 mL via INTRAMUSCULAR

## 2018-11-07 MED ORDER — FENTANYL CITRATE (PF) 100 MCG/2ML IJ SOLN
INTRAMUSCULAR | Status: DC | PRN
  Administered 2018-11-07 (×2): 50 ug via INTRAVENOUS

## 2018-11-07 MED ORDER — OXYCODONE HCL 5 MG/5ML PO SOLN
5.0000 mg | Freq: Once | ORAL | Status: DC | PRN
  Filled 2018-11-07: qty 5

## 2018-11-07 MED ORDER — KETOROLAC TROMETHAMINE 30 MG/ML IJ SOLN
INTRAMUSCULAR | Status: DC | PRN
  Administered 2018-11-07: 30 mg via INTRAVENOUS

## 2018-11-07 MED ORDER — GLYCOPYRROLATE PF 0.2 MG/ML IJ SOSY
PREFILLED_SYRINGE | INTRAMUSCULAR | Status: AC
  Filled 2018-11-07: qty 1

## 2018-11-07 MED ORDER — MEPERIDINE HCL 25 MG/ML IJ SOLN
6.2500 mg | INTRAMUSCULAR | Status: DC | PRN
  Filled 2018-11-07: qty 1

## 2018-11-07 MED ORDER — PROPOFOL 10 MG/ML IV BOLUS
INTRAVENOUS | Status: AC
  Filled 2018-11-07: qty 20

## 2018-11-07 SURGICAL SUPPLY — 56 items
BIPOLAR CUTTING LOOP 21FR (ELECTRODE)
CANISTER SUCT 3000ML PPV (MISCELLANEOUS) ×3 IMPLANT
CANNULA CURETTE W/SYR 6 (CANNULA) IMPLANT
CANNULA CURETTE W/SYR 7 (CANNULA) IMPLANT
CATH NOVY CORNUAL CURVED 5.0 (CATHETERS) IMPLANT
CATH ROBINSON RED A/P 16FR (CATHETERS) ×3 IMPLANT
COVER MAYO STAND STRL (DRAPES) ×3 IMPLANT
COVER WAND RF STERILE (DRAPES) ×3 IMPLANT
CURETTE PIPELLE ENDOMTRL SUCTN (MISCELLANEOUS) ×2 IMPLANT
DERMABOND ADVANCED (GAUZE/BANDAGES/DRESSINGS)
DERMABOND ADVANCED .7 DNX12 (GAUZE/BANDAGES/DRESSINGS) IMPLANT
DILATOR CANAL MILEX (MISCELLANEOUS) IMPLANT
DRAPE C-ARM 42X120 X-RAY (DRAPES) IMPLANT
DURAPREP 26ML APPLICATOR (WOUND CARE) ×3 IMPLANT
ELECT BIPOLAR KNIFE NDL PTD 7M (ELECTRODE) IMPLANT
ELECT REM PT RETURN 9FT ADLT (ELECTROSURGICAL) ×3
ELECTRODE REM PT RTRN 9FT ADLT (ELECTROSURGICAL) ×2 IMPLANT
GAUZE 4X4 16PLY RFD (DISPOSABLE) ×3 IMPLANT
GLOVE BIO SURGEON STRL SZ8 (GLOVE) ×6 IMPLANT
GOWN STRL REUS W/TWL LRG LVL3 (GOWN DISPOSABLE) ×3 IMPLANT
GOWN STRL REUS W/TWL XL LVL3 (GOWN DISPOSABLE) ×6 IMPLANT
HOLDER FOLEY CATH W/STRAP (MISCELLANEOUS) IMPLANT
IV NS IRRIG 3000ML ARTHROMATIC (IV SOLUTION) ×3 IMPLANT
KIT PROCEDURE FLUENT (KITS) ×3 IMPLANT
KIT TURNOVER CYSTO (KITS) ×3 IMPLANT
LOOP CUTTING BIPOLAR 21FR (ELECTRODE) IMPLANT
MANIPULATOR UTERINE 4.5 ZUMI (MISCELLANEOUS) ×3 IMPLANT
NDL SAFETY ECLIPSE 18X1.5 (NEEDLE) IMPLANT
NEEDLE HYPO 18GX1.5 SHARP (NEEDLE)
NEEDLE HYPO 25X1 1.5 SAFETY (NEEDLE) ×3 IMPLANT
NEEDLE INSUFFLATION 120MM (ENDOMECHANICALS) ×3 IMPLANT
NS IRRIG 500ML POUR BTL (IV SOLUTION) ×3 IMPLANT
PACK LAPAROSCOPY BASIN (CUSTOM PROCEDURE TRAY) IMPLANT
PACK TRENDGUARD 450 HYBRID PRO (MISCELLANEOUS) IMPLANT
PACK VAGINAL MINOR WOMEN LF (CUSTOM PROCEDURE TRAY) ×3 IMPLANT
PAD OB MATERNITY 4.3X12.25 (PERSONAL CARE ITEMS) ×3 IMPLANT
PIPELLE ENDOMETRIAL SUCTION CU (MISCELLANEOUS) ×3
SEPRAFILM MEMBRANE 5X6 (MISCELLANEOUS) IMPLANT
SET IRRIG TUBING LAPAROSCOPIC (IRRIGATION / IRRIGATOR) ×3 IMPLANT
SET IRRIG Y TYPE TUR BLADDER L (SET/KITS/TRAYS/PACK) ×3 IMPLANT
SHEARS HARMONIC ACE PLUS 36CM (ENDOMECHANICALS) IMPLANT
STENT BALLN UTERINE 3CM 6FR (STENTS) IMPLANT
STENT BALLN UTERINE 4CM 6FR (STENTS) IMPLANT
SUT MNCRL AB 4-0 PS2 18 (SUTURE) ×3 IMPLANT
SYR 30ML LL (SYRINGE) ×3 IMPLANT
SYR 50ML LL SCALE MARK (SYRINGE) IMPLANT
SYR 5ML LL (SYRINGE) ×3 IMPLANT
SYR CONTROL 10ML LL (SYRINGE) IMPLANT
SYRINGE 3CC/18X1.5 ECLIPSE (MISCELLANEOUS) ×3 IMPLANT
TOWEL OR 17X26 10 PK STRL BLUE (TOWEL DISPOSABLE) ×6 IMPLANT
TRAY FOLEY W/BAG SLVR 14FR (SET/KITS/TRAYS/PACK) IMPLANT
TRENDGUARD 450 HYBRID PRO PACK (MISCELLANEOUS)
TROCAR OPTI TIP 5M 100M (ENDOMECHANICALS) ×6 IMPLANT
TUBING EVAC SMOKE HEATED PNEUM (TUBING) IMPLANT
WARMER LAPAROSCOPE (MISCELLANEOUS) ×3 IMPLANT
WATER STERILE IRR 500ML POUR (IV SOLUTION) ×3 IMPLANT

## 2018-11-07 NOTE — H&P (Signed)
Christine Best is a 43 y.o. female , has abnormal uterine bleeding and infertility, with endometrial abnormality prior to frozen embryo transfer.  Pertinent Gynecological History: Menses: flow is excessive with use of 3 pads or tampons on heaviest days Bleeding: dysfunctional uterine bleeding Contraception: none DES exposure: denies Blood transfusions: none Sexually transmitted diseases: no past history Last pap: normal    Menstrual History: Menarche age: 22 No LMP recorded.    Past Medical History:  Diagnosis Date  . Anemia   . Chronic constipation   . HTN (hypertension)    followed by pcp  . Infertility, female   . Pelvic adhesions   . Type 2 diabetes mellitus (HCC)    followed by pcp;   fasting sugar-- 130   (does not check blood sugar on regular basis)  . Uterine fibroid   . Wears contact lenses                     Past Surgical History:  Procedure Laterality Date  . ADENOIDECTOMY  age 56  . ROBOT ASSISTED MYOMECTOMY N/A 03/26/2018   Procedure: XI ROBOTIC ASSISTED LAPAROSCOPIC MYOMECTOMY WITH TRANSVAGINAL ULTRASOUND GUIDANCE AND EXCISION OF ENDOMETRIOSIS;  Surgeon: Governor Specking, MD;  Location: [facility];  Service: Gynecology;  Laterality: N/A;  . ROBOT ASSISTED MYOMECTOMY  01-24-2010   dr Delsa Sale @[facility]    and [facility] HYSTEROSCOPY             Family History  Problem Relation Age of Onset  . Diabetes Mother   . Hypertension Mother   . Cancer Father   . Diabetes Brother   . Cancer Maternal Aunt        type unknown   No hereditary disease.  No cancer of breast, ovary, uterus. No cutaneous leiomyomatosis or renal cell carcinoma.  Social History   Socioeconomic History  . Marital status: Single    Spouse name: Not on file  . Number of children: 0  . Years of education: Not on file  . Highest education level: Not on file  Occupational History  . Occupation: Engineer, mining  Social Needs  . Financial resource strain: Not on file  .  Food insecurity    Worry: Not on file    Inability: Not on file  . Transportation needs    Medical: Not on file    Non-medical: Not on file  Tobacco Use  . Smoking status: Never Smoker  . Smokeless tobacco: Never Used  Substance and Sexual Activity  . Alcohol use: Yes    Alcohol/week: 0.0 standard drinks    Comment: occasional  . Drug use: No  . Sexual activity: Yes    Birth control/protection: None  Lifestyle  . Physical activity    Days per week: Not on file    Minutes per session: Not on file  . Stress: Not on file  Relationships  . Social Herbalist on phone: Not on file    Gets together: Not on file    Attends religious service: Not on file    Active member of club or organization: Not on file    Attends meetings of clubs or organizations: Not on file    Relationship status: Not on file  . Intimate partner violence    Fear of current or ex partner: Not on file    Emotionally abused: Not on file    Physically abused: Not on file    Forced sexual activity: Not on file  Other Topics Concern  . Not on file  Social History Narrative  . Not on file    Allergies  Allergen Reactions  . Lisinopril Swelling and Other (See Comments)    angioedema    No current facility-administered medications on file prior to encounter.    Current Outpatient Medications on File Prior to Encounter  Medication Sig Dispense Refill  . lubiprostone (AMITIZA) 24 MCG capsule TAKE 1 CAPSULE BY MOUTH 2 TIMES DAILY WITH A MEAL. (Patient taking differently: Take 24 mcg by mouth 2 (two) times daily with a meal. TAKE 1 CAPSULE BY MOUTH 2 TIMES DAILY WITH A MEAL.) 60 capsule 3  . metFORMIN (GLUCOPHAGE-XR) 750 MG 24 hr tablet Take 750 mg by mouth daily with breakfast.    . NIFEdipine (PROCARDIA) 20 MG capsule Take 1 capsule (20 mg total) by mouth 2 (two) times a day. (Patient taking differently: Take 20 mg by mouth 2 (two) times a day. ) 60 capsule 3  . NONFORMULARY OR COMPOUNDED ITEM Place  1 application rectally daily as needed (for irritation). Nitroglycerin 1.25% Cream       Review of Systems  Constitutional: Negative.   HENT: Negative.   Eyes: Negative.   Respiratory: Negative.   Cardiovascular: Negative.   Gastrointestinal: Negative.   Genitourinary: Negative.   Musculoskeletal: Negative.   Skin: Negative.   Neurological: Negative.   Endo/Heme/Allergies: Negative.   Psychiatric/Behavioral: Negative.      Physical Exam  BP 133/85   Pulse 79   Temp (!) 97.5 F (36.4 C) (Oral)   Resp 16   Ht 5\' 2"  (1.575 m)   Wt 117.9 kg   LMP 10/18/2018 (Exact Date)   SpO2 100%   BMI 47.55 kg/m  Constitutional: She is oriented to person, place, and time. She appears well-developed and well-nourished.  HENT:  Head: Normocephalic and atraumatic.  Nose: Nose normal.  Mouth/Throat: Oropharynx is clear and moist. No oropharyngeal exudate.  Eyes: Conjunctivae normal and EOM are normal. Pupils are equal, round, and reactive to light. No scleral icterus.  Neck: Normal range of motion. Neck supple. No tracheal deviation present. No thyromegaly present.  Cardiovascular: Normal rate.   Respiratory: Effort normal and breath sounds normal.  GI: Soft. Bowel sounds are normal. She exhibits no distension and no mass. There is no tenderness.  Lymphadenopathy:    She has no cervical adenopathy.  Neurological: She is alert and oriented to person, place, and time. She has normal reflexes.  Skin: Skin is warm.  Psychiatric: She has a normal mood and affect. Her behavior is normal. Judgment and thought content normal.    Assessment/Plan:  Endometrial abnormality, polyp versus intrauterine synechia, status post laparoscopic robotic assisted myomectomy. Cryopreserved embryos in storage Morbid obesity I recommended and discussed the benefits, risks, and alternatives to hysteroscopy, possible polypectomy, possible lysis of adhesions, possible stent placement with the patient.  All of her  questions were answered.  Governor Specking, MD

## 2018-11-07 NOTE — Anesthesia Postprocedure Evaluation (Signed)
Anesthesia Post Note  Patient: Christine Best  Procedure(s) Performed: HYSTEROSCOPY, endometrial biopsy (N/A Vagina )     Patient location during evaluation: PACU Anesthesia Type: General Level of consciousness: awake and alert, oriented and patient cooperative Pain management: pain level controlled Vital Signs Assessment: post-procedure vital signs reviewed and stable Respiratory status: spontaneous breathing, nonlabored ventilation and respiratory function stable Cardiovascular status: blood pressure returned to baseline and stable Postop Assessment: no apparent nausea or vomiting Anesthetic complications: no    Last Vitals:  Vitals:   11/07/18 0930 11/07/18 1217  BP: 133/85 137/75  Pulse: 79 (!) 108  Resp: 16 (!) 8  Temp: (!) 36.4 C 36.7 C  SpO2: 100% 100%    Last Pain:  Vitals:   11/07/18 1217  TempSrc:   PainSc: 0-No pain                 Pervis Hocking

## 2018-11-07 NOTE — Anesthesia Procedure Notes (Signed)
Procedure Name: LMA Insertion Date/Time: 11/07/2018 11:44 AM Performed by: Bufford Spikes, CRNA Pre-anesthesia Checklist: Patient identified, Emergency Drugs available, Suction available and Patient being monitored Patient Re-evaluated:Patient Re-evaluated prior to induction Oxygen Delivery Method: Circle system utilized Preoxygenation: Pre-oxygenation with 100% oxygen Induction Type: IV induction Ventilation: Mask ventilation without difficulty LMA: LMA inserted LMA Size: 4.0 Number of attempts: 1 Airway Equipment and Method: Bite block Placement Confirmation: positive ETCO2 Tube secured with: Tape Dental Injury: Teeth and Oropharynx as per pre-operative assessment

## 2018-11-07 NOTE — Anesthesia Preprocedure Evaluation (Addendum)
Anesthesia Evaluation  Patient identified by MRN, date of birth, ID band Patient awake    Reviewed: Allergy & Precautions, NPO status , Patient's Chart, lab work & pertinent test results  History of Anesthesia Complications Negative for: history of anesthetic complications  Airway Mallampati: II  TM Distance: >3 FB Neck ROM: Full    Dental no notable dental hx. (+) Teeth Intact, Dental Advisory Given   Pulmonary    Pulmonary exam normal breath sounds clear to auscultation       Cardiovascular hypertension, Pt. on medications Normal cardiovascular exam Rhythm:Regular Rate:Normal     Neuro/Psych negative neurological ROS  negative psych ROS   GI/Hepatic negative GI ROS, Neg liver ROS,   Endo/Other  diabetes, Well Controlled, Type 2, Oral Hypoglycemic AgentsMorbid obesityObesity BMI 48  Renal/GU negative Renal ROS   Pelvic adhesions, uterine fibroids History of uterine leiomyoma    Musculoskeletal negative musculoskeletal ROS (+)   Abdominal (+) + obese,   Peds  Hematology  (+) anemia ,   Anesthesia Other Findings   Reproductive/Obstetrics infertility                           Anesthesia Physical  Anesthesia Plan  ASA: III  Anesthesia Plan: General   Post-op Pain Management:    Induction: Intravenous  PONV Risk Score and Plan: 4 or greater and Treatment may vary due to age or medical condition, Ondansetron, Scopolamine patch - Pre-op, Midazolam and Dexamethasone  Airway Management Planned: LMA  Additional Equipment: None  Intra-op Plan:   Post-operative Plan: Extubation in OR  Informed Consent: I have reviewed the patients History and Physical, chart, labs and discussed the procedure including the risks, benefits and alternatives for the proposed anesthesia with the patient or authorized representative who has indicated his/her understanding and acceptance.     Dental  advisory given  Plan Discussed with: CRNA, Anesthesiologist and Surgeon  Anesthesia Plan Comments:        Anesthesia Quick Evaluation

## 2018-11-07 NOTE — Transfer of Care (Signed)
Immediate Anesthesia Transfer of Care Note  Patient: Christine Best  Procedure(s) Performed: HYSTEROSCOPY, endometrial biopsy (N/A Vagina )  Patient Location: PACU  Anesthesia Type:General  Level of Consciousness: awake, alert  and oriented  Airway & Oxygen Therapy: Patient Spontanous Breathing and Patient connected to nasal cannula oxygen  Post-op Assessment: Report given to RN and Post -op Vital signs reviewed and stable  Post vital signs: Reviewed and stable  Last Vitals:  Vitals Value Taken Time  BP    Temp    Pulse 109 11/07/18 1219  Resp    SpO2 100 % 11/07/18 1219  Vitals shown include unvalidated device data.  Last Pain:  Vitals:   11/07/18 0930  TempSrc: Oral      Patients Stated Pain Goal: 5 ([id number])  Complications: No apparent anesthesia complications

## 2018-11-07 NOTE — Discharge Instructions (Signed)

## 2018-11-07 NOTE — Op Note (Signed)
OPERATIVE NOTE  Preoperative diagnosis: Rule out endometrial abnormality (endometrial polyp versus intrauterine synechiae)  Postoperative diagnosis: Adenomyosis  Procedure: Hysteroscopy, endometrial biopsy Surgeon: Governor Specking  Anesthesia: General  Complications: None  Estimated blood loss: Less than 20 mL  Specimen: Endometrial biopsy to pathology  Findings: Endocervical canal appeared normal. The uterus sounded to 8 cm. Endometrial cavity had proliferative appearing endometrium.  There was a gland opening medial to the left tubal ostium, suggestive of adenomyosis.  Otherwise it was of normal appearance and normal configuration. Both tubal ostia were seen.  Description of procedure: Patient was placed in dorsal supine position. General anesthesia was administered. She was placed in lithotomy position. She was prepped and draped in sterile manner. A vaginal speculum was placed. A dilute vasopressin solution containing 0.33 units per milliliter was injected into the cervical stroma x5 cc. A Slimline hysteroscope with 30 lens was inserted into the canal and above findings were noted. Distention medium was normal saline. Distention method was a hysteroscopic pump set at 100 mm mercury. Above findings were noted. Using a Pipelle device endometrium was curetted and the specimen was sent to pathology.  Hemostasis was insured. Instrument count was correct. Estimated blood loss was less than 20 mL. The patient tolerated the procedure well and was transferred to recovery in satisfactory condition.  Patient will undergo frozen embryo transfer next cycle.  Governor Specking, MD

## 2018-11-10 ENCOUNTER — Encounter (HOSPITAL_BASED_OUTPATIENT_CLINIC_OR_DEPARTMENT_OTHER): Payer: Self-pay | Admitting: Obstetrics and Gynecology

## 2018-11-10 LAB — SURGICAL PATHOLOGY

## 2018-11-10 MED FILL — AMITIZA 24 MCG CAPSULES: 24 | 30 days supply | Qty: 60 | Fill #3

## 2018-11-10 MED FILL — NIFEdipine 20 MG CAPS: 20 | 30 days supply | Qty: 60 | Fill #2

## 2018-11-19 MED FILL — ESTRADIOL 2 MG TABLET: 2 | 30 days supply | Qty: 60 | Fill #1

## 2018-11-25 DIAGNOSIS — N979 Female infertility, unspecified: Secondary | ICD-10-CM | POA: Diagnosis not present

## 2018-11-25 DIAGNOSIS — Z113 Encounter for screening for infections with a predominantly sexual mode of transmission: Secondary | ICD-10-CM | POA: Diagnosis not present

## 2018-11-25 DIAGNOSIS — Z3183 Encounter for assisted reproductive fertility procedure cycle: Secondary | ICD-10-CM | POA: Diagnosis not present

## 2018-11-27 DIAGNOSIS — Z20828 Contact with and (suspected) exposure to other viral communicable diseases: Secondary | ICD-10-CM | POA: Diagnosis not present

## 2018-11-27 MED FILL — METHYLPREDNISOLONE 4 MG TAB: 4 | 2 days supply | Qty: 8 | Fill #0

## 2018-12-02 MED FILL — DOTTI 0.1 MG/24HR PTTW: 0.1 | 28 days supply | Qty: 8 | Fill #1

## 2018-12-04 DIAGNOSIS — Z3183 Encounter for assisted reproductive fertility procedure cycle: Secondary | ICD-10-CM | POA: Diagnosis not present

## 2018-12-08 MED FILL — AMITIZA 24 MCG CAPSULES: 24 | 30 days supply | Qty: 60 | Fill #0

## 2018-12-08 MED FILL — NIFEdipine 20 MG CAPS: 20 | 30 days supply | Qty: 60 | Fill #3

## 2018-12-12 DIAGNOSIS — Z113 Encounter for screening for infections with a predominantly sexual mode of transmission: Secondary | ICD-10-CM | POA: Diagnosis not present

## 2018-12-12 DIAGNOSIS — N979 Female infertility, unspecified: Secondary | ICD-10-CM | POA: Diagnosis not present

## 2018-12-12 DIAGNOSIS — Z3183 Encounter for assisted reproductive fertility procedure cycle: Secondary | ICD-10-CM | POA: Diagnosis not present

## 2018-12-15 DIAGNOSIS — Z3183 Encounter for assisted reproductive fertility procedure cycle: Secondary | ICD-10-CM | POA: Diagnosis not present

## 2018-12-15 DIAGNOSIS — Z113 Encounter for screening for infections with a predominantly sexual mode of transmission: Secondary | ICD-10-CM | POA: Diagnosis not present

## 2018-12-15 DIAGNOSIS — N979 Female infertility, unspecified: Secondary | ICD-10-CM | POA: Diagnosis not present

## 2018-12-17 MED FILL — PROGESTERONE OIL 50 MG/ML V: 50 | 30 days supply | Qty: 30 | Fill #1

## 2018-12-17 MED FILL — ESTRADIOL 2 MG TABLET: 2 | 30 days supply | Qty: 60 | Fill #2

## 2018-12-24 ENCOUNTER — Ambulatory Visit: Payer: 59 | Admitting: Family Medicine

## 2018-12-24 MED FILL — DOTTI 0.1 MG/24HR PTTW: 0.1 | 28 days supply | Qty: 8 | Fill #2

## 2018-12-26 DIAGNOSIS — Z3200 Encounter for pregnancy test, result unknown: Secondary | ICD-10-CM | POA: Diagnosis not present

## 2018-12-29 ENCOUNTER — Encounter: Payer: Self-pay | Admitting: Family Medicine

## 2018-12-29 ENCOUNTER — Ambulatory Visit: Payer: 59 | Admitting: Family Medicine

## 2018-12-29 ENCOUNTER — Other Ambulatory Visit: Payer: Self-pay

## 2018-12-29 VITALS — BP 133/88 | HR 76 | Temp 98.2°F | Resp 12 | Ht 62.0 in | Wt 212.0 lb

## 2018-12-29 DIAGNOSIS — E1165 Type 2 diabetes mellitus with hyperglycemia: Secondary | ICD-10-CM

## 2018-12-29 DIAGNOSIS — Z3201 Encounter for pregnancy test, result positive: Secondary | ICD-10-CM

## 2018-12-29 DIAGNOSIS — Z3200 Encounter for pregnancy test, result unknown: Secondary | ICD-10-CM

## 2018-12-29 DIAGNOSIS — Z3401 Encounter for supervision of normal first pregnancy, first trimester: Secondary | ICD-10-CM

## 2018-12-29 DIAGNOSIS — Z9189 Other specified personal risk factors, not elsewhere classified: Secondary | ICD-10-CM | POA: Diagnosis not present

## 2018-12-29 LAB — HEMOGLOBIN A1C
Est. average glucose Bld gHb Est-mCnc: 154 mg/dL
Hgb A1c MFr Bld: 7 % — ABNORMAL HIGH (ref 4.8–5.6)

## 2018-12-29 LAB — POCT URINE PREGNANCY: Preg Test, Ur: POSITIVE — AB

## 2018-12-29 MED ORDER — METFORMIN HCL ER 750 MG PO TB24: 750.0000 mg | ORAL_TABLET | Freq: Every day | ORAL | 1 refills | Status: DC

## 2018-12-29 MED ORDER — FREESTYLE LIBRE READER DEVI: 1.0000 | Freq: Every day | 1 refills | Status: DC

## 2018-12-29 MED ORDER — FREESTYLE LIBRE 14 DAY SENSOR MISC: 2.0000 | Freq: Two times a day (BID) | 6 refills | Status: DC

## 2018-12-29 MED FILL — METFORMIN HCL ER 750 MG TB2: 750 | 90 days supply | Qty: 90 | Fill #0

## 2018-12-29 NOTE — Progress Notes (Signed)
Established Patient Office Visit  Subjective:  Patient ID: Christine Best, female    DOB: [birth year]  Age: 43 y.o. MRN: [id number]  CC:  Chief Complaint  Patient presents with  . Possible Pregnancy    pt stated no symptoms.[name]    HPI Christine Best presents for   Patient reports that she is [redacted] weeks pregnant by IVF She has no concerns today  Diabetes Pt reports that she is monitoring her sugars She reports that her sugars are good She denies hypoglycemia She eats about the same as before She is on the metformin  Lab Results  Component Value Date   HGBA1C 7.0 (H) 12/29/2018      Past Medical History:  Diagnosis Date  . Anemia   . Chronic constipation   . HTN (hypertension)    followed by pcp  . Infertility, female   . Pelvic adhesions   . Type 2 diabetes mellitus (HCC)    followed by pcp;   fasting sugar-- 130   (does not check blood sugar on regular basis)  . Uterine fibroid   . Wears contact lenses     Past Surgical History:  Procedure Laterality Date  . ADENOIDECTOMY  age 48  . HYSTEROSCOPY N/A 11/07/2018   Procedure: HYSTEROSCOPY, endometrial biopsy;  Surgeon: Governor Specking, MD;  [facility];  Service: Gynecology;  Laterality: N/A;  . ROBOT ASSISTED MYOMECTOMY N/A 03/26/2018   Procedure: XI ROBOTIC ASSISTED LAPAROSCOPIC MYOMECTOMY WITH TRANSVAGINAL ULTRASOUND GUIDANCE AND EXCISION OF ENDOMETRIOSIS;  Surgeon: Governor Specking, MD;  Location: [facility];  Service: Gynecology;  Laterality: N/A;  . ROBOT ASSISTED MYOMECTOMY  01-24-2010   dr Delsa Sale @[facility]    and [facility] HYSTEROSCOPY    Family History  Problem Relation Age of Onset  . Diabetes Mother   . Hypertension Mother   . Cancer Father   . Diabetes Brother   . Cancer Maternal Aunt        type unknown    Social History   Socioeconomic History  . Marital status: Single    Spouse name: Not on file  . Number of children: 0  . Years of education: Not on file   . Highest education level: Not on file  Occupational History  . Occupation: Engineer, mining  Social Needs  . Financial resource strain: Not on file  . Food insecurity    Worry: Not on file    Inability: Not on file  . Transportation needs    Medical: Not on file    Non-medical: Not on file  Tobacco Use  . Smoking status: Never Smoker  . Smokeless tobacco: Never Used  Substance and Sexual Activity  . Alcohol use: Yes    Alcohol/week: 0.0 standard drinks    Comment: occasional  . Drug use: No  . Sexual activity: Yes    Birth control/protection: None  Lifestyle  . Physical activity    Days per week: Not on file    Minutes per session: Not on file  . Stress: Not on file  Relationships  . Social Herbalist on phone: Not on file    Gets together: Not on file    Attends religious service: Not on file    Active member of club or organization: Not on file    Attends meetings of clubs or organizations: Not on file    Relationship status: Not on file  . Intimate partner violence    Fear of current or  ex partner: Not on file    Emotionally abused: Not on file    Physically abused: Not on file    Forced sexual activity: Not on file  Other Topics Concern  . Not on file  Social History Narrative  . Not on file    Outpatient Medications Prior to Visit  Medication Sig Dispense Refill  . lubiprostone (AMITIZA) 24 MCG capsule TAKE 1 CAPSULE BY MOUTH 2 TIMES DAILY WITH A MEAL. (Patient taking differently: Take 24 mcg by mouth 2 (two) times daily with a meal. TAKE 1 CAPSULE BY MOUTH 2 TIMES DAILY WITH A MEAL.) 60 capsule 3  . metFORMIN (GLUCOPHAGE-XR) 750 MG 24 hr tablet Take 750 mg by mouth daily with breakfast.    . NIFEdipine (PROCARDIA) 20 MG capsule Take 1 capsule (20 mg total) by mouth 2 (two) times a day. (Patient taking differently: Take 20 mg by mouth 2 (two) times a day. ) 60 capsule 3  . NONFORMULARY OR COMPOUNDED ITEM Place 1 application rectally daily as needed  (for irritation). Nitroglycerin 1.25% Cream     No facility-administered medications prior to visit.     Allergies  Allergen Reactions  . Lisinopril Swelling and Other (See Comments)    angioedema    ROS Review of Systems Review of Systems  Constitutional: Negative for activity change, appetite change, chills and fever.  HENT: Negative for congestion, nosebleeds, trouble swallowing and voice change.   Respiratory: Negative for cough, shortness of breath and wheezing.   Gastrointestinal: Negative for diarrhea, nausea and vomiting.  Genitourinary: Negative for difficulty urinating, dysuria, flank pain and hematuria.  Musculoskeletal: Negative for back pain, joint swelling and neck pain.  Neurological: Negative for dizziness, speech difficulty, light-headedness and numbness.  See HPI. All other review of systems negative.     Objective:    Physical Exam  BP 133/88   Pulse 76   Temp 98.2 F (36.8 C)   Resp 12   Ht 5\' 2"  (1.575 m)   Wt 212 lb (96.2 kg)   SpO2 96%   BMI 38.78 kg/m  Wt Readings from Last 3 Encounters:  12/29/18 212 lb (96.2 kg)  11/07/18 260 lb (117.9 kg)  07/30/18 260 lb (117.9 kg)   Physical Exam  Constitutional: Oriented to person, place, and time. Appears well-developed and well-nourished.  HENT:  Head: Normocephalic and atraumatic.  Eyes: Conjunctivae and EOM are normal.  Cardiovascular: Normal rate, regular rhythm, normal heart sounds and intact distal pulses.  No murmur heard. Pulmonary/Chest: Effort normal and breath sounds normal. No stridor. No respiratory distress. Has no wheezes.  Neurological: Is alert and oriented to person, place, and time.  Skin: Skin is warm. Capillary refill takes less than 2 seconds.  Psychiatric: Has a normal mood and affect. Behavior is normal. Judgment and thought content normal.    Health Maintenance Due  Topic Date Due  . INFLUENZA VACCINE  09/20/2018  . PAP SMEAR-Modifier  02/24/2019    There are no  preventive care reminders to display for this patient.  Lab Results  Component Value Date   TSH 1.530 09/03/2017   Lab Results  Component Value Date   WBC 8.5 11/04/2018   HGB 10.1 (L) 11/04/2018   HCT 33.4 (L) 11/04/2018   MCV 71.2 (L) 11/04/2018   PLT 430 (H) 11/04/2018   Lab Results  Component Value Date   NA 137 11/04/2018   K 3.3 (L) 11/04/2018   CO2 24 11/04/2018   GLUCOSE 168 (H) 11/04/2018  BUN 9 11/04/2018   CREATININE 0.67 11/04/2018   BILITOT 0.3 09/03/2017   ALKPHOS 80 09/03/2017   AST 14 09/03/2017   ALT 10 09/03/2017   PROT 6.8 09/03/2017   ALBUMIN 3.9 09/03/2017   CALCIUM 9.0 11/04/2018   ANIONGAP 9 11/04/2018   Lab Results  Component Value Date   CHOL 161 09/03/2017   Lab Results  Component Value Date   HDL 53 09/03/2017   Lab Results  Component Value Date   LDLCALC 97 09/03/2017   Lab Results  Component Value Date   TRIG 55 09/03/2017   Lab Results  Component Value Date   CHOLHDL 3.2 04/03/2017   Lab Results  Component Value Date   HGBA1C 6.8 (H) 07/30/2018      Assessment & Plan:   Problem List Items Addressed This Visit    None    Visit Diagnoses    Possible pregnancy, not yet confirmed    -  Primary   Relevant Orders   POCT urine pregnancy (Completed)   Primigravida in first trimester       Relevant Orders   Ambulatory referral to Obstetrics / Gynecology   Type 2 diabetes mellitus with hyperglycemia, without long-term current use of insulin (HCC)       Relevant Orders   Hemoglobin A1c   Diabetes mellitus high risk          Follow up with Obstetrics Continue monitoring blood glucose and avoid hypoglyemia  Meds ordered this encounter  Medications  . Continuous Blood Gluc Receiver (FREESTYLE LIBRE READER) DEVI    Sig: 1 Device by Does not apply route daily.    Dispense:  1 Device    Refill:  1  . Continuous Blood Gluc Sensor (FREESTYLE LIBRE 14 DAY SENSOR) MISC    Sig: 2 Devices by Does not apply route 2 (two)  times daily.    Dispense:  2 each    Refill:  6    Follow-up: No follow-ups on file.    Forrest Moron, MD

## 2018-12-29 NOTE — Patient Instructions (Addendum)
   Care [facility]    If you have lab work done today you will be contacted with your lab results within the next 2 weeks.  If you have not heard from [place] then please contact us. The fastest way to get your results is to register for My Chart.   IF you received an x-ray today, you will receive an invoice from [facility]. Please contact [facility] at [phone] with questions or concerns regarding your invoice.   IF you received labwork today, you will receive an invoice from [facility]. Please contact [facility] at [phone] with questions or concerns regarding your invoice.   Our billing staff will not be able to assist you with questions regarding bills from these companies.  You will be contacted with the lab results as soon as they are available. The fastest way to get your results is to activate your My Chart account. Instructions are located on the last page of this paperwork. If you have not heard from [place] regarding the results in 2 weeks, please contact this office.

## 2019-01-01 MED FILL — FREESTYLE LIBRE 14 DAY SENS: 28 days supply | Qty: 2 | Fill #0

## 2019-01-01 MED FILL — FREESTYLE LIBRE 14 DAY READ: 20 days supply | Qty: 1 | Fill #0

## 2019-01-02 ENCOUNTER — Other Ambulatory Visit: Payer: Self-pay

## 2019-01-02 ENCOUNTER — Ambulatory Visit (INDEPENDENT_AMBULATORY_CARE_PROVIDER_SITE_OTHER): Payer: 59 | Admitting: Gastroenterology

## 2019-01-02 DIAGNOSIS — R110 Nausea: Secondary | ICD-10-CM

## 2019-01-06 NOTE — Progress Notes (Signed)
Patient left before she was seen.

## 2019-01-09 DIAGNOSIS — O0900 Supervision of pregnancy with history of infertility, unspecified trimester: Secondary | ICD-10-CM | POA: Diagnosis not present

## 2019-01-12 ENCOUNTER — Encounter: Payer: Self-pay | Admitting: Family Medicine

## 2019-01-23 MED FILL — FREESTYLE LIBRE 14 DAY SENS: 28 days supply | Qty: 2 | Fill #1

## 2019-01-28 DIAGNOSIS — N760 Acute vaginitis: Secondary | ICD-10-CM | POA: Diagnosis not present

## 2019-01-28 DIAGNOSIS — N911 Secondary amenorrhea: Secondary | ICD-10-CM | POA: Diagnosis not present

## 2019-01-28 DIAGNOSIS — Z3A10 10 weeks gestation of pregnancy: Secondary | ICD-10-CM | POA: Diagnosis not present

## 2019-01-28 DIAGNOSIS — O99211 Obesity complicating pregnancy, first trimester: Secondary | ICD-10-CM | POA: Diagnosis not present

## 2019-01-28 DIAGNOSIS — Z8742 Personal history of other diseases of the female genital tract: Secondary | ICD-10-CM | POA: Diagnosis not present

## 2019-01-28 DIAGNOSIS — O09511 Supervision of elderly primigravida, first trimester: Secondary | ICD-10-CM | POA: Diagnosis not present

## 2019-01-28 DIAGNOSIS — O09811 Supervision of pregnancy resulting from assisted reproductive technology, first trimester: Secondary | ICD-10-CM | POA: Diagnosis not present

## 2019-01-28 DIAGNOSIS — N898 Other specified noninflammatory disorders of vagina: Secondary | ICD-10-CM | POA: Diagnosis not present

## 2019-01-28 MED FILL — metroNIDAZOLE 0.75 % GEL: 0.75 | 5 days supply | Qty: 70 | Fill #0

## 2019-01-30 DIAGNOSIS — Z3490 Encounter for supervision of normal pregnancy, unspecified, unspecified trimester: Secondary | ICD-10-CM | POA: Insufficient documentation

## 2019-02-03 DIAGNOSIS — Z3689 Encounter for other specified antenatal screening: Secondary | ICD-10-CM | POA: Diagnosis not present

## 2019-02-03 DIAGNOSIS — O99211 Obesity complicating pregnancy, first trimester: Secondary | ICD-10-CM | POA: Diagnosis not present

## 2019-02-03 DIAGNOSIS — Z113 Encounter for screening for infections with a predominantly sexual mode of transmission: Secondary | ICD-10-CM | POA: Diagnosis not present

## 2019-02-03 DIAGNOSIS — Z3A11 11 weeks gestation of pregnancy: Secondary | ICD-10-CM | POA: Diagnosis not present

## 2019-02-03 DIAGNOSIS — Z368A Encounter for antenatal screening for other genetic defects: Secondary | ICD-10-CM | POA: Diagnosis not present

## 2019-02-03 DIAGNOSIS — O09511 Supervision of elderly primigravida, first trimester: Secondary | ICD-10-CM | POA: Diagnosis not present

## 2019-02-03 LAB — OB RESULTS CONSOLE HIV ANTIBODY (ROUTINE TESTING): HIV: NONREACTIVE

## 2019-02-03 LAB — OB RESULTS CONSOLE GC/CHLAMYDIA
Chlamydia: NEGATIVE
Gonorrhea: NEGATIVE

## 2019-02-03 LAB — OB RESULTS CONSOLE RUBELLA ANTIBODY, IGM: Rubella: IMMUNE

## 2019-02-03 LAB — OB RESULTS CONSOLE RPR: RPR: NONREACTIVE

## 2019-02-03 LAB — OB RESULTS CONSOLE ABO/RH: RH Type: POSITIVE

## 2019-02-03 LAB — OB RESULTS CONSOLE HEPATITIS B SURFACE ANTIGEN: Hepatitis B Surface Ag: NEGATIVE

## 2019-02-03 LAB — OB RESULTS CONSOLE ANTIBODY SCREEN: Antibody Screen: NEGATIVE

## 2019-02-06 DIAGNOSIS — R030 Elevated blood-pressure reading, without diagnosis of hypertension: Secondary | ICD-10-CM | POA: Diagnosis not present

## 2019-02-10 DIAGNOSIS — Z3A12 12 weeks gestation of pregnancy: Secondary | ICD-10-CM | POA: Diagnosis not present

## 2019-02-10 DIAGNOSIS — O09511 Supervision of elderly primigravida, first trimester: Secondary | ICD-10-CM | POA: Diagnosis not present

## 2019-02-10 DIAGNOSIS — O200 Threatened abortion: Secondary | ICD-10-CM | POA: Diagnosis not present

## 2019-02-10 DIAGNOSIS — O3411 Maternal care for benign tumor of corpus uteri, first trimester: Secondary | ICD-10-CM | POA: Diagnosis not present

## 2019-02-17 DIAGNOSIS — I10 Essential (primary) hypertension: Secondary | ICD-10-CM | POA: Diagnosis not present

## 2019-02-17 DIAGNOSIS — Z331 Pregnant state, incidental: Secondary | ICD-10-CM | POA: Diagnosis not present

## 2019-02-17 DIAGNOSIS — E1165 Type 2 diabetes mellitus with hyperglycemia: Secondary | ICD-10-CM | POA: Diagnosis not present

## 2019-02-17 MED FILL — OMNIPOD DASH 5 PACK MISC: 30 days supply | Qty: 10 | Fill #0

## 2019-02-17 MED FILL — UNIFINE PENTIPS 32GX5/32": 32G X 4 MM | 50 days supply | Qty: 100 | Fill #0

## 2019-02-17 MED FILL — UNIFINE PENTIPS 32GX5/32: 32G X 4 MM | 50 days supply | Qty: 100 | Fill #0

## 2019-02-17 MED FILL — LEVEMIR FLEXTOUCH 100 UNITS: 100 | 30 days supply | Qty: 3 | Fill #0

## 2019-02-18 ENCOUNTER — Encounter: Payer: 59 | Attending: Endocrinology | Admitting: Registered"

## 2019-02-18 ENCOUNTER — Encounter: Payer: Self-pay | Admitting: Registered"

## 2019-02-18 ENCOUNTER — Other Ambulatory Visit: Payer: Self-pay

## 2019-02-18 DIAGNOSIS — O24119 Pre-existing diabetes mellitus, type 2, in pregnancy, unspecified trimester: Secondary | ICD-10-CM | POA: Insufficient documentation

## 2019-02-18 MED FILL — FREESTYLE LIBRE 14 DAY SENS: 28 days supply | Qty: 2 | Fill #2

## 2019-02-18 NOTE — Progress Notes (Signed)
Diabetes Self-Management Education  Visit Type: First/Initial  Appt. Start Time: 1430 Appt. End Time: [id number]  02/18/2019  Christine Best, identified by name and date of birth, is a 43 y.o. female with a diagnosis of Diabetes: Gestational Diabetes.   ASSESSMENT  There were no vitals taken for this visit. There is no height or weight on file to calculate BMI.   Pt states she is working with Dr. Chalmers Cater to get started using Omnipod. Patient started Levemir today.  Patient states she does not cook and relies on fast food. Pt states she enjoys vegetables and will sometimes get salads when eating out. Pt reports daily intake of sweetened beverages. Pt states she was counseled by her doctor about ways to cut the carbs down to acceptable levels when eating out.  RD used Gestational Diabetes handout to teach carb counting, blood sugar targets, and rule of 15 for hypoglycemia. Because Patient is just starting 2nd trimester and was told the meal plans were for 3rd trimester and she may not need quite as many carbs and may want to stay closer to 45 grams carbs as the upper limited for awhile instead of 60 grams.  Diabetes Self-Management Education - 02/18/19 1447      Visit Information   Visit Type  First/Initial      Initial Visit   Diabetes Type  Gestational Diabetes    Are you currently following a meal plan?  No    Are you taking your medications as prescribed?  Yes   metformin, lantus - will be using omnipod   Date Diagnosed  2 yrs ago      Health Coping   How would you rate your overall health?  Fair      Psychosocial Assessment   Patient Belief/Attitude about Diabetes  Motivated to manage diabetes    How often do you need to have someone help you when you read instructions, pamphlets, or other written materials from your doctor or pharmacy?  1 - Never    What is the last grade level you completed in school?  masters level      Complications   Last HgB A1C per patient/outside source   7.1 %   per md referral   How often do you check your blood sugar?  > 4 times/day    Number of hypoglycemic episodes per month  0    Have you had a dilated eye exam in the past 12 months?  Yes    Have you had a dental exam in the past 12 months?  Yes    Are you checking your feet?  Yes    How many days per week are you checking your feet?  7      Dietary Intake   Breakfast  chick fil-a, hasbrowns, tea or OJ    Snack (morning)  none OR yogurt parfait    Lunch  sandwiches, fries, sprite,    Snack (afternoon)  none    Dinner  sandwich pizza    Snack (evening)  none    Beverage(s)  water 4 bottles, daily soda, 3x week OJ      Exercise   Exercise Type  ADL's    How many days per week to you exercise?  0    How many minutes per day do you exercise?  0    Total minutes per week of exercise  0      Patient Education   Previous Diabetes Education  No  Nutrition management   Role of diet in the treatment of diabetes and the relationship between the three main macronutrients and blood glucose level;Food label reading, portion sizes and measuring food.;Carbohydrate counting;Reviewed blood glucose goals for pre and post meals and how to evaluate the patients' food intake on their blood glucose level.    Acute complications  Taught treatment of hypoglycemia - the 15 rule.    Preconception care  Pregnancy and GDM  Role of pre-pregnancy blood glucose control on the development of the fetus;Reviewed with patient blood glucose goals with pregnancy      Individualized Goals (developed by patient)   Nutrition  General guidelines for healthy choices and portions discussed      Outcomes   Expected Outcomes  Demonstrated interest in learning. Expect positive outcomes    Future DMSE  PRN    Program Status  Completed       Individualized Plan for Diabetes Self-Management Training:   Learning Objective:  Patient will have a greater understanding of diabetes self-management. Patient education plan  is to attend individual and/or group sessions per assessed needs and concerns.   Plan:   Patient instructed to monitor glucose levels: FBS: 60 - <95; 1 hour: <140; 2 hour: <120  Patient received handouts:  Nutrition Diabetes and Pregnancy, including carb counting list, MyPlate for Moms, Nutrition in the [employer]  Future DSME appointment: PRN

## 2019-02-20 HISTORY — DX: Maternal care for unspecified type scar from previous cesarean delivery: O34.219

## 2019-02-26 DIAGNOSIS — Z1152 Encounter for screening for COVID-19: Secondary | ICD-10-CM | POA: Diagnosis not present

## 2019-03-02 DIAGNOSIS — E1165 Type 2 diabetes mellitus with hyperglycemia: Secondary | ICD-10-CM | POA: Diagnosis not present

## 2019-03-02 DIAGNOSIS — Z331 Pregnant state, incidental: Secondary | ICD-10-CM | POA: Diagnosis not present

## 2019-03-02 MED FILL — HumaLOG 100 UNIT/ML SOLN: 100 | 90 days supply | Qty: 50 | Fill #0

## 2019-03-04 DIAGNOSIS — Z3A15 15 weeks gestation of pregnancy: Secondary | ICD-10-CM | POA: Diagnosis not present

## 2019-03-04 DIAGNOSIS — O09512 Supervision of elderly primigravida, second trimester: Secondary | ICD-10-CM | POA: Diagnosis not present

## 2019-03-04 DIAGNOSIS — O3430 Maternal care for cervical incompetence, unspecified trimester: Secondary | ICD-10-CM | POA: Diagnosis not present

## 2019-03-04 DIAGNOSIS — O99810 Abnormal glucose complicating pregnancy: Secondary | ICD-10-CM | POA: Diagnosis not present

## 2019-03-09 ENCOUNTER — Other Ambulatory Visit: Payer: Self-pay | Admitting: *Deleted

## 2019-03-09 ENCOUNTER — Encounter: Payer: Self-pay | Admitting: *Deleted

## 2019-03-09 NOTE — Patient Outreach (Signed)
[place] [facility]) Care Management  [place]  03/09/2019   DEEDE HESKETT 1975-06-28 [id number]  Telephone Assessment    Subjective: Outreach call to patient , no answer able to leave a HIPAA compliant message for return call.   0936 Incoming return call from patient. Christine Best placed call to [facility] care management department regarding enrolling in Diabetes during Pregnancy program on 03/03/19. Her due date is 08/22/19. This is her first baby and she is having a baby girl .She faxed completed program paperwork to the office . She discussed that her fasting blood sugars are staying at 95 to 100 range and post prandial CBG's running at 200 she is aware that this is above target reading.  She is followed by Dr. Chalmers Cater, endocrinologist, reports history of diabetes for the last 2 years. She has attended Nutrition class on preexisting diabetes during pregnancy. She reports being enrolled with Baby Script app.   She is waiting Omnipod in the mail and will follow up with endocrinology office for virtual education instruction.    Objective: NA   Encounter Medications:  Outpatient Encounter Medications as of 03/09/2019  Medication Sig  . aspirin 81 MG chewable tablet Chew by mouth daily.  . Continuous Blood Gluc Receiver (FREESTYLE LIBRE READER) DEVI 1 Device by Does not apply route daily.  . Continuous Blood Gluc Sensor (FREESTYLE LIBRE 14 DAY SENSOR) MISC 2 Devices by Does not apply route 2 (two) times daily.  . insulin detemir (LEVEMIR) 100 UNIT/ML injection Inject 10 Units into the skin daily.  . metFORMIN (GLUCOPHAGE-XR) 750 MG 24 hr tablet Take 1 tablet (750 mg total) by mouth daily with breakfast.  . Prenatal Vit-Fe Fumarate-FA (PRENATAL VITAMIN PO) Take 1 tablet by mouth daily.  [name] lubiprostone (AMITIZA) 24 MCG capsule TAKE 1 CAPSULE BY MOUTH 2 TIMES DAILY WITH A MEAL. (Patient taking differently: Take 24 mcg by mouth 2 (two) times daily with a meal. TAKE 1 CAPSULE BY  MOUTH 2 TIMES DAILY WITH A MEAL.)  . NIFEdipine (PROCARDIA) 20 MG capsule Take 1 capsule (20 mg total) by mouth 2 (two) times a day. (Patient taking differently: Take 20 mg by mouth 2 (two) times a day. )  . NONFORMULARY OR COMPOUNDED ITEM Place 1 application rectally daily as needed (for irritation). Nitroglycerin 1.25% Cream   No facility-administered encounter medications on file as of 03/09/2019.    Functional Status:  In your present state of health, do you have any difficulty performing the following activities: 03/09/2019 11/07/2018  Hearing? N N  Vision? N N  Difficulty concentrating or making decisions? N N  Walking or climbing stairs? N N  Dressing or bathing? N N  Doing errands, shopping? N -  Preparing Food and eating ? N -  Using the Toilet? N -  In the past six months, have you accidently leaked urine? N -  Do you have problems with loss of bowel control? N -  Managing your Medications? N -  Managing your Finances? N -  Housekeeping or managing your Housekeeping? N -  Some recent data might be hidden    Fall/Depression Screening: Fall Risk  03/09/2019 02/18/2019 12/29/2018  Falls in the past year? 0 0 0  Number falls in past yr: - - 0  Injury with Fall? - - 0  Follow up - - -   PHQ 2/9 Scores 03/09/2019 02/18/2019 12/29/2018 07/30/2018 06/23/2018 10/15/2017 09/03/2017  PHQ - 2 Score 0 0 0 0 0 0 0  PHQ- 9 Score - - - - - - 2    Assessment:  [employer] Employee enrolling in Diabetes During Pregnancy program, for self management assistance with Diabetes during pregnancy.   Plan:  RN will send this visit note to members OB provider  RN will contact member by phone/email monthly and as needed to assist to assist with diabetes during pregnancy self -care management and assess members progress toward set goals.    [facility] CM Care Plan Problem One     Most Recent Value  Care Plan Problem One  Knowledge deficit related to Diabetes Management During Pregancy   Role Documenting  the Problem One  Care Management Telephonic Coordinator  Care Plan for Problem One  Active  THN Long Term Goal   Over the next 90 days patient will demonstrate good understanding of self management of diabetes as evidenced by self monitoring of blood sugars 4 times a day or as prescribed with 90% blood sugars meeting target, adherence to Plate Method, or carb controlled diet meal plan, adherence to provider appointments , adherence in contacting this RN at least monthly per program guidelines, delivery of healthy baby with no pre or post delivery maternal or fetal complications related to Diabetes   THN Long Term Goal Start Date  03/09/19  Interventions for Problem One Long Term Goal  Reveiwed THN Diabetes during pregnancy program guidelines and benefits, mailed information packet to patient after explantion of contents provided via phone, activated program benefits for patient , reviewed the American Diabetes Association recommendations related to frequency of glucose testing and targets, defined hypoglycemic symptoms and reviewed rule of 15-15 for treating hypoglycemia, reviewed strategies to treat elevated glucose , reviewed the plate method and/or basic carbohydrate counting , discussed effect of stress on blood sugar, reinforced keeping all provider appointments . Encouraged to contact this RN Care Coordinator for any questions related to diabetes during preganancy self management       Joylene Draft, RN, BSN [place] Management Coordinator  [phone]- Mobile [phone]- [place]

## 2019-03-12 MED FILL — FREESTYLE LIBRE 14 DAY SENS: 28 days supply | Qty: 2 | Fill #3

## 2019-03-23 MED FILL — METFORMIN HCL ER 750 MG TB2: 750 | 90 days supply | Qty: 90 | Fill #1

## 2019-03-23 MED FILL — OMNIPOD DASH 5 PACK MISC: 30 days supply | Qty: 10 | Fill #1

## 2019-03-27 DIAGNOSIS — O09512 Supervision of elderly primigravida, second trimester: Secondary | ICD-10-CM | POA: Diagnosis not present

## 2019-03-27 DIAGNOSIS — Z363 Encounter for antenatal screening for malformations: Secondary | ICD-10-CM | POA: Diagnosis not present

## 2019-03-27 DIAGNOSIS — O09812 Supervision of pregnancy resulting from assisted reproductive technology, second trimester: Secondary | ICD-10-CM | POA: Diagnosis not present

## 2019-03-27 DIAGNOSIS — Z3A18 18 weeks gestation of pregnancy: Secondary | ICD-10-CM | POA: Diagnosis not present

## 2019-03-30 DIAGNOSIS — Z331 Pregnant state, incidental: Secondary | ICD-10-CM | POA: Diagnosis not present

## 2019-03-30 DIAGNOSIS — Z9641 Presence of insulin pump (external) (internal): Secondary | ICD-10-CM | POA: Diagnosis not present

## 2019-03-30 DIAGNOSIS — E1165 Type 2 diabetes mellitus with hyperglycemia: Secondary | ICD-10-CM | POA: Diagnosis not present

## 2019-03-30 DIAGNOSIS — I10 Essential (primary) hypertension: Secondary | ICD-10-CM | POA: Diagnosis not present

## 2019-04-01 ENCOUNTER — Other Ambulatory Visit: Payer: Self-pay | Admitting: *Deleted

## 2019-04-01 NOTE — Patient Outreach (Addendum)
[place] [facility]) Care Management  04/01/2019  Christine Best [birth year] [id number]   Christine Best is a member of [place] Management Diabetes during pregnancy program.  She is pregnant with her first child , due date is 08/22/19.   Subjective: Received secure email from Ricky Ala stating she has received the Omnipod and awaiting approval for Dexcom unit. She reports recent adjustment on with insulin by Dr. Chalmers Cater endocrinologist.  she reports that her average blood sugars last week were 114 and this week 104. Christine Best reports recent visit with OB went well and next visit in 4 weeks.    Plan RN will contact patient by phone or email monthly and as needed to  assist with preexistiing diabetes during pregnancy self management and assess progress towards goal    Joylene Draft, RN, BSN  [place] Management Coordinator  [phone]- Mobile [phone]- [place]

## 2019-04-08 MED FILL — FREESTYLE LIBRE 14 DAY SENS: 28 days supply | Qty: 2 | Fill #4

## 2019-04-17 MED FILL — OMNIPOD DASH 5 PACK MISC: 30 days supply | Qty: 10 | Fill #2

## 2019-04-23 DIAGNOSIS — O24119 Pre-existing diabetes mellitus, type 2, in pregnancy, unspecified trimester: Secondary | ICD-10-CM | POA: Diagnosis not present

## 2019-04-23 DIAGNOSIS — E119 Type 2 diabetes mellitus without complications: Secondary | ICD-10-CM | POA: Diagnosis not present

## 2019-04-23 DIAGNOSIS — Z3A25 25 weeks gestation of pregnancy: Secondary | ICD-10-CM | POA: Diagnosis not present

## 2019-04-23 DIAGNOSIS — O24112 Pre-existing diabetes mellitus, type 2, in pregnancy, second trimester: Secondary | ICD-10-CM | POA: Diagnosis not present

## 2019-04-27 DIAGNOSIS — O23599 Infection of other part of genital tract in pregnancy, unspecified trimester: Secondary | ICD-10-CM | POA: Diagnosis not present

## 2019-04-27 DIAGNOSIS — Z3A23 23 weeks gestation of pregnancy: Secondary | ICD-10-CM | POA: Diagnosis not present

## 2019-04-27 DIAGNOSIS — I10 Essential (primary) hypertension: Secondary | ICD-10-CM | POA: Diagnosis not present

## 2019-04-27 DIAGNOSIS — O09512 Supervision of elderly primigravida, second trimester: Secondary | ICD-10-CM | POA: Diagnosis not present

## 2019-04-27 DIAGNOSIS — E119 Type 2 diabetes mellitus without complications: Secondary | ICD-10-CM | POA: Diagnosis not present

## 2019-04-27 DIAGNOSIS — N898 Other specified noninflammatory disorders of vagina: Secondary | ICD-10-CM | POA: Diagnosis not present

## 2019-04-27 DIAGNOSIS — O99212 Obesity complicating pregnancy, second trimester: Secondary | ICD-10-CM | POA: Diagnosis not present

## 2019-04-27 MED FILL — metroNIDAZOLE 0.75 % GEL: 0.75 | 5 days supply | Qty: 70 | Fill #0

## 2019-04-28 MED FILL — LEVEMIR FLEXTOUCH 100 UNITS: 100 | 30 days supply | Qty: 3 | Fill #1

## 2019-04-29 DIAGNOSIS — E1165 Type 2 diabetes mellitus with hyperglycemia: Secondary | ICD-10-CM | POA: Diagnosis not present

## 2019-04-29 DIAGNOSIS — Z9641 Presence of insulin pump (external) (internal): Secondary | ICD-10-CM | POA: Diagnosis not present

## 2019-04-30 ENCOUNTER — Other Ambulatory Visit: Payer: Self-pay | Admitting: *Deleted

## 2019-04-30 NOTE — Patient Outreach (Signed)
[facility]) Care Management  04/30/2019  SHIRLEAN RATHJEN November 20, 1975 [id number]  Monthly Follow up assessment    Aldine Wedekind is a member of [place] Management Diabetes during pregnancy program.  She is pregnant with her first child , due date is 08/03/19.   Subjective   Email communication : From Ricky Ala  Endocrine appt went well. still waiting for the Dexcom meter.  A1C 6.8 on yesterday.  My doctor increased the basal rate of insulin and is considering long acting was the pregnancy progress. advg 118 to 125.   She also reports OB appointment went well and her C-section date has been moved up to 08/03/19.  In return communication to [place] offered additional education resource support denies need at this time    Plan Will continue  monthly outreach to patient assisting with preexisting diabetes during pregnancy glucose management control .    Joylene Draft, RN, BSN  [place] Management Coordinator  [phone]- Mobile [phone]- Toll Free Main Office

## 2019-05-01 DIAGNOSIS — E1165 Type 2 diabetes mellitus with hyperglycemia: Secondary | ICD-10-CM | POA: Diagnosis not present

## 2019-05-01 DIAGNOSIS — Z794 Long term (current) use of insulin: Secondary | ICD-10-CM | POA: Diagnosis not present

## 2019-05-04 MED FILL — HumaLOG 100 UNIT/ML SOLN: 100 | 30 days supply | Qty: 30 | Fill #0

## 2019-05-11 MED FILL — OMNIPOD DASH 5 PACK MISC: 30 days supply | Qty: 10 | Fill #3

## 2019-05-25 DIAGNOSIS — O24112 Pre-existing diabetes mellitus, type 2, in pregnancy, second trimester: Secondary | ICD-10-CM | POA: Diagnosis not present

## 2019-05-25 DIAGNOSIS — Z23 Encounter for immunization: Secondary | ICD-10-CM | POA: Diagnosis not present

## 2019-05-25 DIAGNOSIS — Z3A27 27 weeks gestation of pregnancy: Secondary | ICD-10-CM | POA: Diagnosis not present

## 2019-05-25 DIAGNOSIS — O09512 Supervision of elderly primigravida, second trimester: Secondary | ICD-10-CM | POA: Diagnosis not present

## 2019-05-25 DIAGNOSIS — Z3689 Encounter for other specified antenatal screening: Secondary | ICD-10-CM | POA: Diagnosis not present

## 2019-05-25 DIAGNOSIS — O4402 Placenta previa specified as without hemorrhage, second trimester: Secondary | ICD-10-CM | POA: Diagnosis not present

## 2019-05-25 DIAGNOSIS — O99212 Obesity complicating pregnancy, second trimester: Secondary | ICD-10-CM | POA: Diagnosis not present

## 2019-06-05 ENCOUNTER — Other Ambulatory Visit: Payer: Self-pay | Admitting: *Deleted

## 2019-06-05 NOTE — Patient Outreach (Signed)
[facility]) Care Management  06/05/2019  Christine Best [birth year] [id number]   Monthly Follow up assessment for April 2021   Christine Best is a member of [place] Management Diabetes during pregnancyprogram. She is pregnant with her first child , due date is 08/03/19.   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  She is [redacted] weeks pregnant, now beginning every 2 week follow up with OB.  She now has Dexcom for glucose monitoring and reports blood sugars above goal in 200's at times.  She has follow up endocrinology visit on next week with Dr. Chalmers Cater as long acting insulin being added is in the plan, she discussed updating me afterwards.     Plan Will plan follow up after endocrinology visit , will continue monthly outreach to assist patient with preexisting diabetes during pregnancy glucose management control. Will assign  EMMI education Nutrition and exercise during pregnancy that will cover plate method of carb control to help with glucose control.    Joylene Draft, RN, BSN  [place] Management Coordinator  [phone]- Mobile [phone]- Toll Free Main Office

## 2019-06-10 ENCOUNTER — Other Ambulatory Visit (HOSPITAL_COMMUNITY): Payer: Self-pay | Admitting: Endocrinology

## 2019-06-10 ENCOUNTER — Other Ambulatory Visit: Payer: Self-pay | Admitting: Family Medicine

## 2019-06-10 DIAGNOSIS — Z9641 Presence of insulin pump (external) (internal): Secondary | ICD-10-CM | POA: Diagnosis not present

## 2019-06-10 DIAGNOSIS — Z3401 Encounter for supervision of normal first pregnancy, first trimester: Secondary | ICD-10-CM

## 2019-06-10 DIAGNOSIS — E1165 Type 2 diabetes mellitus with hyperglycemia: Secondary | ICD-10-CM | POA: Diagnosis not present

## 2019-06-10 DIAGNOSIS — Z331 Pregnant state, incidental: Secondary | ICD-10-CM | POA: Diagnosis not present

## 2019-06-10 DIAGNOSIS — I10 Essential (primary) hypertension: Secondary | ICD-10-CM | POA: Diagnosis not present

## 2019-06-10 MED FILL — HumaLOG 100 UNIT/ML SOLN: 100 | 30 days supply | Qty: 30 | Fill #1

## 2019-06-10 MED FILL — METFORMIN HCL ER 750 MG TB2: 750 | 90 days supply | Qty: 90 | Fill #0

## 2019-06-10 MED FILL — LEVEMIR FLEXTOUCH 100 UNITS: 100 | 30 days supply | Qty: 3 | Fill #0

## 2019-06-10 MED FILL — OMNIPOD DASH 5 PACK MISC: 30 days supply | Qty: 10 | Fill #4

## 2019-06-29 DIAGNOSIS — E109 Type 1 diabetes mellitus without complications: Secondary | ICD-10-CM | POA: Diagnosis not present

## 2019-07-06 DIAGNOSIS — O3663X0 Maternal care for excessive fetal growth, third trimester, not applicable or unspecified: Secondary | ICD-10-CM | POA: Diagnosis not present

## 2019-07-06 DIAGNOSIS — O24419 Gestational diabetes mellitus in pregnancy, unspecified control: Secondary | ICD-10-CM | POA: Diagnosis not present

## 2019-07-06 DIAGNOSIS — O4400 Placenta previa specified as without hemorrhage, unspecified trimester: Secondary | ICD-10-CM | POA: Diagnosis not present

## 2019-07-06 DIAGNOSIS — Z3A33 33 weeks gestation of pregnancy: Secondary | ICD-10-CM | POA: Diagnosis not present

## 2019-07-13 DIAGNOSIS — E1165 Type 2 diabetes mellitus with hyperglycemia: Secondary | ICD-10-CM | POA: Diagnosis not present

## 2019-07-13 DIAGNOSIS — Z331 Pregnant state, incidental: Secondary | ICD-10-CM | POA: Diagnosis not present

## 2019-07-13 DIAGNOSIS — O24414 Gestational diabetes mellitus in pregnancy, insulin controlled: Secondary | ICD-10-CM | POA: Diagnosis not present

## 2019-07-13 DIAGNOSIS — Z3A34 34 weeks gestation of pregnancy: Secondary | ICD-10-CM | POA: Diagnosis not present

## 2019-07-13 DIAGNOSIS — Z9641 Presence of insulin pump (external) (internal): Secondary | ICD-10-CM | POA: Diagnosis not present

## 2019-07-15 ENCOUNTER — Other Ambulatory Visit: Payer: Self-pay | Admitting: *Deleted

## 2019-07-15 NOTE — Patient Outreach (Signed)
[place] [facility]) Care Management  07/15/2019  Christine Best 05/05/1975 [id number]   Monthly Follow up assessmentfor May 2021   Christine Best is a member of [place] Management Diabetes during pregnancyprogram, Preexisting Diabetes during pregnancy.  She is pregnant with her first child , due date is6/14/21, with scheduled Cesarean section.    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  She is discussed recent insulin adjustment on Omni pod, at endocrinology visit , her A1c is at 7.1. No report on her daily blood sugar reading goals. Her next appointment with endocrinology will be after delivery.  Christine Best denies any other education support needs at this time, expressed thanks for follow up.   Plan Communicated next follow up will be after delivery , that is about 2 weeks away.   Christine Draft, RN, BSN  [place] Management Coordinator  [phone]- Mobile [phone]- Toll Free Main Office

## 2019-07-16 DIAGNOSIS — N760 Acute vaginitis: Secondary | ICD-10-CM | POA: Diagnosis not present

## 2019-07-16 DIAGNOSIS — N898 Other specified noninflammatory disorders of vagina: Secondary | ICD-10-CM | POA: Diagnosis not present

## 2019-07-16 DIAGNOSIS — O24415 Gestational diabetes mellitus in pregnancy, controlled by oral hypoglycemic drugs: Secondary | ICD-10-CM | POA: Diagnosis not present

## 2019-07-16 DIAGNOSIS — Z3A34 34 weeks gestation of pregnancy: Secondary | ICD-10-CM | POA: Diagnosis not present

## 2019-07-16 DIAGNOSIS — Z331 Pregnant state, incidental: Secondary | ICD-10-CM | POA: Diagnosis not present

## 2019-07-16 MED FILL — METRONIDAZOLE 500 MG TABS: 500 | 7 days supply | Qty: 14 | Fill #0

## 2019-07-23 DIAGNOSIS — Z3A35 35 weeks gestation of pregnancy: Secondary | ICD-10-CM | POA: Diagnosis not present

## 2019-07-23 DIAGNOSIS — O24415 Gestational diabetes mellitus in pregnancy, controlled by oral hypoglycemic drugs: Secondary | ICD-10-CM | POA: Diagnosis not present

## 2019-07-27 ENCOUNTER — Encounter (HOSPITAL_COMMUNITY): Payer: Self-pay | Admitting: *Deleted

## 2019-07-27 NOTE — Pre-Procedure Instructions (Signed)
Pt to call Dr Chalmers Cater office regarding insulin pump the night before surgery.  Has instructions to not take metformin or levamir on the day of surgery.

## 2019-07-30 DIAGNOSIS — Z3685 Encounter for antenatal screening for Streptococcus B: Secondary | ICD-10-CM | POA: Diagnosis not present

## 2019-07-30 DIAGNOSIS — Z3A36 36 weeks gestation of pregnancy: Secondary | ICD-10-CM | POA: Diagnosis not present

## 2019-07-30 DIAGNOSIS — O09813 Supervision of pregnancy resulting from assisted reproductive technology, third trimester: Secondary | ICD-10-CM | POA: Diagnosis not present

## 2019-07-30 DIAGNOSIS — O99213 Obesity complicating pregnancy, third trimester: Secondary | ICD-10-CM | POA: Diagnosis not present

## 2019-07-30 DIAGNOSIS — O09513 Supervision of elderly primigravida, third trimester: Secondary | ICD-10-CM | POA: Diagnosis not present

## 2019-07-30 DIAGNOSIS — O24419 Gestational diabetes mellitus in pregnancy, unspecified control: Secondary | ICD-10-CM | POA: Diagnosis not present

## 2019-07-31 DIAGNOSIS — Z794 Long term (current) use of insulin: Secondary | ICD-10-CM | POA: Diagnosis not present

## 2019-07-31 DIAGNOSIS — E1165 Type 2 diabetes mellitus with hyperglycemia: Secondary | ICD-10-CM | POA: Diagnosis not present

## 2019-07-31 NOTE — Patient Instructions (Signed)
Christine Best  07/31/2019   Your procedure is scheduled on:  08/03/2019  Arrive at 0800 at Entrance C on [address] at [facility]  and [address]. You are invited to use the FREE valet parking or use the Visitor's parking deck.  Pick up the phone at the desk and dial [phone].  Call this number if you have problems the morning of surgery: [phone]  Remember:   Do not eat food:(After Midnight) Desps de medianoche.  Do not drink clear liquids: (After Midnight) Desps de medianoche.  Take these medicines the morning of surgery with A SIP OF WATER:  Do not take metformin the day before or the day of surgery.  Stop your insulin pump at bedtime the night before surgery unless you receive different instructions from Dr Almetta Lovely office.     Do not wear jewelry, make-up or nail polish.  Do not wear lotions, powders, or perfumes. Do not wear deodorant.  Do not shave 48 hours prior to surgery.  Do not bring valuables to the hospital.  [facility] is not   responsible for any belongings or valuables brought to the hospital.  Contacts, dentures or bridgework may not be worn into surgery.  Leave suitcase in the car. After surgery it may be brought to your room.  For patients admitted to the hospital, checkout time is 11:00 AM the day of              discharge.      Please read over the following fact sheets that you were given:     Preparing for Surgery

## 2019-08-01 ENCOUNTER — Other Ambulatory Visit (HOSPITAL_COMMUNITY)
Admission: RE | Admit: 2019-08-01 | Discharge: 2019-08-01 | Disposition: A | Discharge status: Home / Self Care | Payer: 59 | Source: Ambulatory Visit | Attending: Obstetrics and Gynecology | Admitting: Obstetrics and Gynecology

## 2019-08-01 ENCOUNTER — Other Ambulatory Visit: Payer: Self-pay

## 2019-08-01 DIAGNOSIS — O3429 Maternal care due to uterine scar from other previous surgery: Secondary | ICD-10-CM | POA: Diagnosis not present

## 2019-08-01 DIAGNOSIS — Z20822 Contact with and (suspected) exposure to covid-19: Secondary | ICD-10-CM | POA: Diagnosis not present

## 2019-08-01 DIAGNOSIS — Z794 Long term (current) use of insulin: Secondary | ICD-10-CM | POA: Diagnosis not present

## 2019-08-01 DIAGNOSIS — Z3A37 37 weeks gestation of pregnancy: Secondary | ICD-10-CM | POA: Diagnosis not present

## 2019-08-01 DIAGNOSIS — O1002 Pre-existing essential hypertension complicating childbirth: Secondary | ICD-10-CM | POA: Diagnosis not present

## 2019-08-01 DIAGNOSIS — O2412 Pre-existing diabetes mellitus, type 2, in childbirth: Secondary | ICD-10-CM | POA: Diagnosis not present

## 2019-08-01 DIAGNOSIS — E119 Type 2 diabetes mellitus without complications: Secondary | ICD-10-CM | POA: Diagnosis not present

## 2019-08-01 LAB — COMPREHENSIVE METABOLIC PANEL
ALT: 15 U/L (ref 0–44)
AST: 16 U/L (ref 15–41)
Albumin: 2.5 g/dL — ABNORMAL LOW (ref 3.5–5.0)
Alkaline Phosphatase: 84 U/L (ref 38–126)
Anion gap: 9 (ref 5–15)
BUN: 6 mg/dL (ref 6–20)
CO2: 20 mmol/L — ABNORMAL LOW (ref 22–32)
Calcium: 9.3 mg/dL (ref 8.9–10.3)
Chloride: 107 mmol/L (ref 98–111)
Creatinine, Ser: 0.71 mg/dL (ref 0.44–1.00)
GFR calc Af Amer: >60 mL/min (ref >60)
GFR calc non Af Amer: >60 mL/min (ref >60)
Glucose, Bld: 114 mg/dL — ABNORMAL HIGH (ref 70–99)
Potassium: 3.9 mmol/L (ref 3.5–5.1)
Sodium: 136 mmol/L (ref 135–145)
Total Bilirubin: 0.4 mg/dL (ref 0.3–1.2)
Total Protein: 6.4 g/dL — ABNORMAL LOW (ref 6.5–8.1)

## 2019-08-01 LAB — RPR: RPR Ser Ql: NONREACTIVE

## 2019-08-01 LAB — CBC
HCT: 34.6 % — ABNORMAL LOW (ref 36.0–46.0)
Hemoglobin: 10.5 g/dL — ABNORMAL LOW (ref 12.0–15.0)
MCH: 22.5 pg — ABNORMAL LOW (ref 26.0–34.0)
MCHC: 30.3 g/dL (ref 30.0–36.0)
MCV: 74.1 fL — ABNORMAL LOW (ref 80.0–100.0)
Platelets: 256 10*3/uL (ref 150–400)
RBC: 4.67 MIL/uL (ref 3.87–5.11)
RDW: 15.9 % — ABNORMAL HIGH (ref 11.5–15.5)
WBC: 6.5 10*3/uL (ref 4.0–10.5)
nRBC: 0 % (ref 0.0–0.2)

## 2019-08-01 LAB — TYPE AND SCREEN
ABO/RH(D): O POS
Antibody Screen: NEGATIVE

## 2019-08-01 LAB — SARS CORONAVIRUS 2 (TAT 6-24 HRS): SARS Coronavirus 2: NEGATIVE

## 2019-08-01 LAB — ABO/RH: ABO/RH(D): O POS

## 2019-08-01 NOTE — MAU Note (Signed)
Pt here for covid swab and lab draw. Denies symptoms or sick contacts. Swab collected.  

## 2019-08-02 NOTE — Anesthesia Preprocedure Evaluation (Addendum)
Anesthesia Evaluation  Patient identified by MRN, date of birth, ID band Patient awake    Reviewed: Allergy & Precautions, NPO status , Unable to perform ROS - Chart review only  Airway Mallampati: III  TM Distance: >3 FB Neck ROM: Full    Dental no notable dental hx. (+) Implants, Dental Advisory Given   Pulmonary neg pulmonary ROS,    Pulmonary exam normal breath sounds clear to auscultation       Cardiovascular hypertension, Pt. on medications Normal cardiovascular exam Rhythm:Regular Rate:Normal     Neuro/Psych negative neurological ROS  negative psych ROS   GI/Hepatic negative GI ROS, Neg liver ROS,   Endo/Other  diabetes, Well Controlled  Renal/GU negative Renal ROS     Musculoskeletal negative musculoskeletal ROS (+)   Abdominal (+) + obese,   Peds  Hematology .Lab Results      Component                Value               Date                      WBC                      6.5                 08/01/2019                HGB                      10.5 (L)            08/01/2019                HCT                      34.6 (L)            08/01/2019                MCV                      74.1 (L)            08/01/2019                PLT                      256                 08/01/2019            T&S available   Anesthesia Other Findings   Reproductive/Obstetrics (+) Pregnancy                            Anesthesia Physical Anesthesia Plan  ASA: III  Anesthesia Plan: Combined Spinal and Epidural   Post-op Pain Management:    Induction: Intravenous  PONV Risk Score and Plan: 3 and Treatment may vary due to age or medical condition, Ondansetron and Dexamethasone  Airway Management Planned: Nasal Cannula and Natural Airway  Additional Equipment: None  Intra-op Plan:   Post-operative Plan:   Informed Consent: I have reviewed the patients History and Physical, chart, labs and  discussed the procedure including the risks, benefits and alternatives for the proposed anesthesia with the patient or authorized representative  who has indicated his/her understanding and acceptance.     Dental advisory given  Plan Discussed with:   Anesthesia Plan Comments: (37 Wk )       Anesthesia Quick Evaluation

## 2019-08-03 ENCOUNTER — Other Ambulatory Visit: Payer: Self-pay

## 2019-08-03 ENCOUNTER — Encounter (HOSPITAL_COMMUNITY): Payer: Self-pay | Admitting: Obstetrics and Gynecology

## 2019-08-03 ENCOUNTER — Inpatient Hospital Stay (HOSPITAL_COMMUNITY): Payer: 59 | Admitting: Anesthesiology

## 2019-08-03 ENCOUNTER — Inpatient Hospital Stay (HOSPITAL_COMMUNITY)
Admission: RE | Admit: 2019-08-03 | Discharge: 2019-08-05 | DRG: 786 | Disposition: A | Discharge status: Home / Self Care | Payer: 59 | Attending: Obstetrics and Gynecology | Admitting: Obstetrics and Gynecology

## 2019-08-03 ENCOUNTER — Encounter (HOSPITAL_COMMUNITY)
Admission: RE | Disposition: A | Discharge status: Home / Self Care | Payer: Self-pay | Source: Home / Self Care | Attending: Obstetrics and Gynecology

## 2019-08-03 DIAGNOSIS — Z3A37 37 weeks gestation of pregnancy: Secondary | ICD-10-CM

## 2019-08-03 DIAGNOSIS — Z794 Long term (current) use of insulin: Secondary | ICD-10-CM | POA: Diagnosis not present

## 2019-08-03 DIAGNOSIS — Z98891 History of uterine scar from previous surgery: Secondary | ICD-10-CM

## 2019-08-03 DIAGNOSIS — O2412 Pre-existing diabetes mellitus, type 2, in childbirth: Secondary | ICD-10-CM | POA: Diagnosis present

## 2019-08-03 DIAGNOSIS — O1002 Pre-existing essential hypertension complicating childbirth: Secondary | ICD-10-CM | POA: Diagnosis present

## 2019-08-03 DIAGNOSIS — O3429 Maternal care due to uterine scar from other previous surgery: Secondary | ICD-10-CM | POA: Diagnosis not present

## 2019-08-03 DIAGNOSIS — Z20822 Contact with and (suspected) exposure to covid-19: Secondary | ICD-10-CM | POA: Diagnosis present

## 2019-08-03 DIAGNOSIS — E119 Type 2 diabetes mellitus without complications: Secondary | ICD-10-CM | POA: Diagnosis present

## 2019-08-03 DIAGNOSIS — O34219 Maternal care for unspecified type scar from previous cesarean delivery: Secondary | ICD-10-CM

## 2019-08-03 LAB — CBC
HCT: 31.4 % — ABNORMAL LOW (ref 36.0–46.0)
Hemoglobin: 9.6 g/dL — ABNORMAL LOW (ref 12.0–15.0)
MCH: 23 pg — ABNORMAL LOW (ref 26.0–34.0)
MCHC: 30.6 g/dL (ref 30.0–36.0)
MCV: 75.3 fL — ABNORMAL LOW (ref 80.0–100.0)
Platelets: 233 10*3/uL (ref 150–400)
RBC: 4.17 MIL/uL (ref 3.87–5.11)
RDW: 15.9 % — ABNORMAL HIGH (ref 11.5–15.5)
WBC: 6.5 10*3/uL (ref 4.0–10.5)
nRBC: 0 % (ref 0.0–0.2)

## 2019-08-03 LAB — CREATININE, SERUM
Creatinine, Ser: 0.63 mg/dL (ref 0.44–1.00)
GFR calc Af Amer: >60 mL/min (ref >60)
GFR calc non Af Amer: >60 mL/min (ref >60)

## 2019-08-03 LAB — GLUCOSE, CAPILLARY
Glucose-Capillary: 106 mg/dL — ABNORMAL HIGH (ref 70–99)
Glucose-Capillary: 98 mg/dL (ref 70–99)

## 2019-08-03 LAB — HEMOGLOBIN A1C
Hgb A1c MFr Bld: 7.8 % — ABNORMAL HIGH (ref 4.8–5.6)
Mean Plasma Glucose: 177.16 mg/dL

## 2019-08-03 SURGERY — Surgical Case
Anesthesia: Epidural | Wound class: Clean Contaminated

## 2019-08-03 MED ORDER — IBUPROFEN 800 MG PO TABS
800.0000 mg | ORAL_TABLET | Freq: Three times a day (TID) | ORAL | Status: DC
  Administered 2019-08-03 – 2019-08-05 (×6): 800 mg via ORAL
  Filled 2019-08-03 (×6): qty 1

## 2019-08-03 MED ORDER — OXYTOCIN-SODIUM CHLORIDE 30-0.9 UT/500ML-% IV SOLN
2.5000 [IU]/h | INTRAVENOUS | Status: AC
  Administered 2019-08-03: 2.5 [IU]/h via INTRAVENOUS
  Filled 2019-08-03: qty 500

## 2019-08-03 MED ORDER — METFORMIN HCL 500 MG PO TABS
750.0000 mg | ORAL_TABLET | Freq: Every day | ORAL | Status: DC
  Administered 2019-08-04 – 2019-08-05 (×2): 750 mg via ORAL
  Filled 2019-08-03 (×2): qty 2

## 2019-08-03 MED ORDER — BUPIVACAINE IN DEXTROSE 0.75-8.25 % IT SOLN
INTRATHECAL | Status: AC
  Filled 2019-08-03: qty 2

## 2019-08-03 MED ORDER — ENOXAPARIN SODIUM 80 MG/0.8ML ~~LOC~~ SOLN
70.0000 mg | SUBCUTANEOUS | Status: DC
  Administered 2019-08-03 – 2019-08-04 (×2): 70 mg via SUBCUTANEOUS
  Filled 2019-08-03 (×2): qty 0.8

## 2019-08-03 MED ORDER — NALBUPHINE HCL 10 MG/ML IJ SOLN: 5.0000 mg | INTRAMUSCULAR | Status: DC | PRN

## 2019-08-03 MED ORDER — FENTANYL CITRATE (PF) 100 MCG/2ML IJ SOLN: INTRAMUSCULAR | Status: DC | PRN

## 2019-08-03 MED ORDER — OXYTOCIN-SODIUM CHLORIDE 30-0.9 UT/500ML-% IV SOLN
INTRAVENOUS | Status: AC
  Filled 2019-08-03: qty 500

## 2019-08-03 MED ORDER — SIMETHICONE 80 MG PO CHEW
80.0000 mg | CHEWABLE_TABLET | Freq: Three times a day (TID) | ORAL | Status: DC
  Administered 2019-08-03 – 2019-08-05 (×6): 80 mg via ORAL
  Filled 2019-08-03 (×6): qty 1

## 2019-08-03 MED ORDER — INSULIN DETEMIR 100 UNIT/ML ~~LOC~~ SOLN
7.0000 [IU] | Freq: Every day | SUBCUTANEOUS | Status: DC
  Administered 2019-08-04: 7 [IU] via SUBCUTANEOUS
  Filled 2019-08-03 (×2): qty 0.07

## 2019-08-03 MED ORDER — ACETAMINOPHEN 500 MG PO TABS
ORAL_TABLET | ORAL | Status: AC
  Filled 2019-08-03: qty 2

## 2019-08-03 MED ORDER — DIBUCAINE (PERIANAL) 1 % EX OINT: 1.0000 "application " | TOPICAL_OINTMENT | CUTANEOUS | Status: DC | PRN

## 2019-08-03 MED ORDER — MENTHOL 3 MG MT LOZG: 1.0000 | LOZENGE | OROMUCOSAL | Status: DC | PRN

## 2019-08-03 MED ORDER — SIMETHICONE 80 MG PO CHEW: 80.0000 mg | CHEWABLE_TABLET | ORAL | Status: DC | PRN

## 2019-08-03 MED ORDER — STERILE WATER FOR IRRIGATION IR SOLN
Status: DC | PRN
  Administered 2019-08-03: 1000 mL

## 2019-08-03 MED ORDER — DIPHENHYDRAMINE HCL 50 MG/ML IJ SOLN
INTRAMUSCULAR | Status: AC
  Filled 2019-08-03: qty 1

## 2019-08-03 MED ORDER — LACTATED RINGERS IV SOLN: INTRAVENOUS | Status: DC

## 2019-08-03 MED ORDER — PHENYLEPHRINE HCL-NACL 20-0.9 MG/250ML-% IV SOLN
INTRAVENOUS | Status: DC | PRN
  Administered 2019-08-03: 60 ug/min via INTRAVENOUS

## 2019-08-03 MED ORDER — ONDANSETRON HCL 4 MG/2ML IJ SOLN
INTRAMUSCULAR | Status: AC
  Filled 2019-08-03: qty 2

## 2019-08-03 MED ORDER — OXYCODONE HCL 5 MG/5ML PO SOLN: 5.0000 mg | Freq: Once | ORAL | Status: DC | PRN

## 2019-08-03 MED ORDER — DIPHENHYDRAMINE HCL 50 MG/ML IJ SOLN
INTRAMUSCULAR | Status: DC | PRN
  Administered 2019-08-03: 25 mg via INTRAVENOUS

## 2019-08-03 MED ORDER — SENNOSIDES-DOCUSATE SODIUM 8.6-50 MG PO TABS
2.0000 | ORAL_TABLET | ORAL | Status: DC
  Administered 2019-08-03 – 2019-08-05 (×2): 2 via ORAL
  Filled 2019-08-03 (×2): qty 2

## 2019-08-03 MED ORDER — PRENATAL MULTIVITAMIN CH
1.0000 | ORAL_TABLET | Freq: Every day | ORAL | Status: DC
  Administered 2019-08-04: 1 via ORAL
  Filled 2019-08-03 (×2): qty 1

## 2019-08-03 MED ORDER — OXYCODONE HCL 5 MG PO TABS: 5.0000 mg | ORAL_TABLET | Freq: Once | ORAL | Status: DC | PRN

## 2019-08-03 MED ORDER — PHENYLEPHRINE HCL-NACL 20-0.9 MG/250ML-% IV SOLN
INTRAVENOUS | Status: AC
  Filled 2019-08-03: qty 250

## 2019-08-03 MED ORDER — ONDANSETRON HCL 4 MG/2ML IJ SOLN: 4.0000 mg | Freq: Once | INTRAMUSCULAR | Status: DC | PRN

## 2019-08-03 MED ORDER — INSULIN ASPART 100 UNIT/ML ~~LOC~~ SOLN
0.0000 [IU] | SUBCUTANEOUS | Status: DC
  Administered 2019-08-03 – 2019-08-04 (×2): 2 [IU] via SUBCUTANEOUS
  Administered 2019-08-04: 1 [IU] via SUBCUTANEOUS
  Administered 2019-08-04: 2 [IU] via SUBCUTANEOUS
  Administered 2019-08-05: 1 [IU] via SUBCUTANEOUS
  Administered 2019-08-05: 2 [IU] via SUBCUTANEOUS

## 2019-08-03 MED ORDER — TETANUS-DIPHTH-ACELL PERTUSSIS 5-2.5-18.5 LF-MCG/0.5 IM SUSP: 0.5000 mL | Freq: Once | INTRAMUSCULAR | Status: DC

## 2019-08-03 MED ORDER — MORPHINE SULFATE (PF) 0.5 MG/ML IJ SOLN
INTRAMUSCULAR | Status: DC | PRN
  Administered 2019-08-03: 150 ug via INTRATHECAL

## 2019-08-03 MED ORDER — HYDROMORPHONE HCL 1 MG/ML IJ SOLN: 0.2500 mg | INTRAMUSCULAR | Status: DC | PRN

## 2019-08-03 MED ORDER — KETOROLAC TROMETHAMINE 30 MG/ML IJ SOLN
30.0000 mg | Freq: Once | INTRAMUSCULAR | Status: AC | PRN
  Administered 2019-08-03: 30 mg via INTRAVENOUS

## 2019-08-03 MED ORDER — DEXTROSE 5 % IV SOLN
3.0000 g | INTRAVENOUS | Status: AC
  Administered 2019-08-03: 3 mg via INTRAVENOUS

## 2019-08-03 MED ORDER — SIMETHICONE 80 MG PO CHEW
80.0000 mg | CHEWABLE_TABLET | ORAL | Status: DC
  Administered 2019-08-03 – 2019-08-05 (×2): 80 mg via ORAL
  Filled 2019-08-03 (×2): qty 1

## 2019-08-03 MED ORDER — MORPHINE SULFATE (PF) 0.5 MG/ML IJ SOLN
INTRAMUSCULAR | Status: AC
  Filled 2019-08-03: qty 10

## 2019-08-03 MED ORDER — ACETAMINOPHEN 500 MG PO TABS
1000.0000 mg | ORAL_TABLET | ORAL | Status: AC
  Administered 2019-08-03: 1000 mg via ORAL

## 2019-08-03 MED ORDER — NALBUPHINE HCL 10 MG/ML IJ SOLN
5.0000 mg | Freq: Once | INTRAMUSCULAR | Status: DC | PRN
  Filled 2019-08-03 (×2): qty 1

## 2019-08-03 MED ORDER — OXYCODONE HCL 5 MG PO TABS
5.0000 mg | ORAL_TABLET | ORAL | Status: DC | PRN
  Administered 2019-08-04 (×2): 5 mg via ORAL
  Administered 2019-08-04: 10 mg via ORAL
  Filled 2019-08-03: qty 1
  Filled 2019-08-03: qty 2
  Filled 2019-08-03: qty 1

## 2019-08-03 MED ORDER — COCONUT OIL OIL
1.0000 "application " | TOPICAL_OIL | Status: DC | PRN
  Administered 2019-08-05: 1 via TOPICAL

## 2019-08-03 MED ORDER — DIPHENHYDRAMINE HCL 25 MG PO CAPS
25.0000 mg | ORAL_CAPSULE | ORAL | Status: DC | PRN
  Administered 2019-08-03 – 2019-08-04 (×5): 25 mg via ORAL
  Filled 2019-08-03 (×5): qty 1

## 2019-08-03 MED ORDER — NALOXONE HCL 0.4 MG/ML IJ SOLN: 0.4000 mg | INTRAMUSCULAR | Status: DC | PRN

## 2019-08-03 MED ORDER — ZOLPIDEM TARTRATE 5 MG PO TABS: 5.0000 mg | ORAL_TABLET | Freq: Every evening | ORAL | Status: DC | PRN

## 2019-08-03 MED ORDER — SODIUM CHLORIDE 0.9% FLUSH: 3.0000 mL | INTRAVENOUS | Status: DC | PRN

## 2019-08-03 MED ORDER — NALBUPHINE HCL 10 MG/ML IJ SOLN
5.0000 mg | INTRAMUSCULAR | Status: DC | PRN
  Administered 2019-08-03 – 2019-08-04 (×3): 5 mg via INTRAVENOUS
  Filled 2019-08-03 (×2): qty 1

## 2019-08-03 MED ORDER — DIPHENHYDRAMINE HCL 25 MG PO CAPS: 25.0000 mg | ORAL_CAPSULE | Freq: Four times a day (QID) | ORAL | Status: DC | PRN

## 2019-08-03 MED ORDER — BUPIVACAINE IN DEXTROSE 0.75-8.25 % IT SOLN
INTRATHECAL | Status: DC | PRN
  Administered 2019-08-03: 12 mg via INTRATHECAL

## 2019-08-03 MED ORDER — DEXTROSE 5 % IV SOLN
INTRAVENOUS | Status: AC
  Filled 2019-08-03: qty 3000

## 2019-08-03 MED ORDER — LACTATED RINGERS IV SOLN
INTRAVENOUS | Status: DC | PRN
Start: 2019-08-03 — End: 2019-08-03

## 2019-08-03 MED ORDER — NALOXONE HCL 4 MG/10ML IJ SOLN
1.0000 ug/kg/h | INTRAVENOUS | Status: DC | PRN
  Filled 2019-08-03: qty 5

## 2019-08-03 MED ORDER — NALBUPHINE HCL 10 MG/ML IJ SOLN: 5.0000 mg | Freq: Once | INTRAMUSCULAR | Status: DC | PRN

## 2019-08-03 MED ORDER — DIPHENHYDRAMINE HCL 50 MG/ML IJ SOLN
12.5000 mg | INTRAMUSCULAR | Status: DC | PRN
  Filled 2019-08-03: qty 1

## 2019-08-03 MED ORDER — FENTANYL CITRATE (PF) 100 MCG/2ML IJ SOLN
INTRAMUSCULAR | Status: DC | PRN
  Administered 2019-08-03: 15 ug via INTRATHECAL

## 2019-08-03 MED ORDER — WITCH HAZEL-GLYCERIN EX PADS: 1.0000 "application " | MEDICATED_PAD | CUTANEOUS | Status: DC | PRN

## 2019-08-03 MED ORDER — KETOROLAC TROMETHAMINE 30 MG/ML IJ SOLN
INTRAMUSCULAR | Status: AC
  Filled 2019-08-03: qty 1

## 2019-08-03 MED ORDER — FENTANYL CITRATE (PF) 100 MCG/2ML IJ SOLN
INTRAMUSCULAR | Status: AC
  Filled 2019-08-03: qty 2

## 2019-08-03 MED ORDER — SCOPOLAMINE 1 MG/3DAYS TD PT72
MEDICATED_PATCH | TRANSDERMAL | Status: AC
  Filled 2019-08-03: qty 1

## 2019-08-03 MED ORDER — ONDANSETRON HCL 4 MG/2ML IJ SOLN: 4.0000 mg | Freq: Three times a day (TID) | INTRAMUSCULAR | Status: DC | PRN

## 2019-08-03 MED ORDER — PHENYLEPHRINE 40 MCG/ML (10ML) SYRINGE FOR IV PUSH (FOR BLOOD PRESSURE SUPPORT)
PREFILLED_SYRINGE | INTRAVENOUS | Status: AC
  Filled 2019-08-03: qty 10

## 2019-08-03 MED ORDER — SCOPOLAMINE 1 MG/3DAYS TD PT72
1.0000 | MEDICATED_PATCH | Freq: Once | TRANSDERMAL | Status: DC
  Administered 2019-08-03: 1.5 mg via TRANSDERMAL

## 2019-08-03 MED ORDER — SODIUM CHLORIDE 0.9 % IR SOLN
Status: DC | PRN
  Administered 2019-08-03: 1000 mL

## 2019-08-03 MED ORDER — ONDANSETRON HCL 4 MG/2ML IJ SOLN
INTRAMUSCULAR | Status: DC | PRN
  Administered 2019-08-03: 4 mg via INTRAVENOUS

## 2019-08-03 MED ORDER — PHENYLEPHRINE HCL (PRESSORS) 10 MG/ML IV SOLN
INTRAVENOUS | Status: DC | PRN
Start: 2019-08-03 — End: 2019-08-03
  Administered 2019-08-03 (×2): 80 ug via INTRAVENOUS

## 2019-08-03 MED ORDER — OXYTOCIN-SODIUM CHLORIDE 30-0.9 UT/500ML-% IV SOLN: INTRAVENOUS | Status: DC | PRN

## 2019-08-03 MED ORDER — OXYTOCIN-SODIUM CHLORIDE 30-0.9 UT/500ML-% IV SOLN
INTRAVENOUS | Status: DC | PRN
  Administered 2019-08-03: 400 mL via INTRAVENOUS

## 2019-08-03 SURGICAL SUPPLY — 38 items
BENZOIN TINCTURE PRP APPL 2/3 (GAUZE/BANDAGES/DRESSINGS) ×2 IMPLANT
CHLORAPREP W/TINT 26ML (MISCELLANEOUS) ×2 IMPLANT
CLAMP CORD UMBIL (MISCELLANEOUS) IMPLANT
CLOTH BEACON ORANGE TIMEOUT ST (SAFETY) ×2 IMPLANT
DRAPE C SECTION CLR SCREEN (DRAPES) ×2 IMPLANT
DRSG OPSITE POSTOP 4X10 (GAUZE/BANDAGES/DRESSINGS) ×2 IMPLANT
ELECT REM PT RETURN 9FT ADLT (ELECTROSURGICAL) ×2
ELECTRODE REM PT RTRN 9FT ADLT (ELECTROSURGICAL) ×1 IMPLANT
EXTRACTOR VACUUM KIWI (MISCELLANEOUS) ×2 IMPLANT
GLOVE BIO SURGEON STRL SZ 6.5 (GLOVE) ×2 IMPLANT
GLOVE BIOGEL PI IND STRL 7.0 (GLOVE) ×2 IMPLANT
GLOVE BIOGEL PI INDICATOR 7.0 (GLOVE) ×2
GOWN STRL REUS W/TWL LRG LVL3 (GOWN DISPOSABLE) ×4 IMPLANT
HEMOSTAT ARISTA ABSORB 3G PWDR (HEMOSTASIS) ×2 IMPLANT
HOVERMATT SINGLE USE (MISCELLANEOUS) ×2 IMPLANT
KIT ABG SYR 3ML LUER SLIP (SYRINGE) IMPLANT
NEEDLE HYPO 25X5/8 SAFETYGLIDE (NEEDLE) IMPLANT
NS IRRIG 1000ML POUR BTL (IV SOLUTION) ×2 IMPLANT
PACK C SECTION WH (CUSTOM PROCEDURE TRAY) ×2 IMPLANT
PAD OB MATERNITY 4.3X12.25 (PERSONAL CARE ITEMS) ×2 IMPLANT
RETRACTOR WND ALEXIS 25 LRG (MISCELLANEOUS) ×1 IMPLANT
RTRCTR C-SECT PINK 25CM LRG (MISCELLANEOUS) IMPLANT
RTRCTR WOUND ALEXIS 25CM LRG (MISCELLANEOUS) ×2
STRIP CLOSURE SKIN 1/2X4 (GAUZE/BANDAGES/DRESSINGS) ×2 IMPLANT
SUT CHROMIC 1 CTX 36 (SUTURE) ×4 IMPLANT
SUT PLAIN 0 NONE (SUTURE) IMPLANT
SUT PLAIN 2 0 XLH (SUTURE) ×2 IMPLANT
SUT VIC AB 0 CT1 27 (SUTURE) ×2
SUT VIC AB 0 CT1 27XBRD ANBCTR (SUTURE) ×2 IMPLANT
SUT VIC AB 2-0 CT1 27 (SUTURE) ×1
SUT VIC AB 2-0 CT1 TAPERPNT 27 (SUTURE) ×1 IMPLANT
SUT VIC AB 3-0 CT1 27 (SUTURE)
SUT VIC AB 3-0 CT1 TAPERPNT 27 (SUTURE) IMPLANT
SUT VIC AB 4-0 KS 27 (SUTURE) ×2 IMPLANT
TOWEL OR 17X24 6PK STRL BLUE (TOWEL DISPOSABLE) ×2 IMPLANT
TRAY FOLEY W/BAG SLVR 14FR LF (SET/KITS/TRAYS/PACK) ×2 IMPLANT
TUBE VACUTAINER EDTA 3ML 13X75 (MISCELLANEOUS) ×2 IMPLANT
WATER STERILE IRR 1000ML POUR (IV SOLUTION) ×2 IMPLANT

## 2019-08-03 NOTE — Progress Notes (Signed)
Subjective: Postpartum Day 0: Cesarean Delivery Patient reports incisional pain and tolerating PO.    Objective: Vital signs in last 24 hours: Temp:  [97.4 F (36.3 C)-98.2 F (36.8 C)] 98.2 F (36.8 C) (06/14 1338) Pulse Rate:  [71-97] 81 (06/14 1338) Resp:  [11-20] 16 (06/14 1607) BP: (101-132)/(61-82) 132/71 (06/14 1338) SpO2:  [96 %-100 %] 100 % (06/14 1338) Weight:  [134.3 kg] 134.3 kg (06/14 0841)  Physical Exam:  General: alert and no distress Lochia: appropriate Uterine Fundus: firm Incision: healing well DVT Evaluation: No evidence of DVT seen on physical exam.  Recent Labs    08/01/19 0908 08/03/19 1316  HGB 10.5* 9.6*  HCT 34.6* 31.4*    Assessment/Plan: Status post Cesarean section. Doing well postoperatively.  Continue current care. Appreciate DM educator input, wil refer back to Dr Chalmers Cater for DM care.   Christine Best 08/03/2019, 4:26 PM

## 2019-08-03 NOTE — Progress Notes (Signed)
Inpatient Diabetes Program Recommendations  AACE/ADA: New Consensus Statement on Inpatient Glycemic Control (2015)  Target Ranges:  Prepandial:   less than 140 mg/dL      Peak postprandial:   less than 180 mg/dL (1-2 hours)      Critically ill patients:  140 - 180 mg/dL   Lab Results  Component Value Date   GLUCAP 98 08/03/2019   HGBA1C 7.0 (H) 12/29/2018    Review of Glycemic Control  Diabetes history: DM 2  Outpatient Diabetes medications:  Pre preg: Metformin 750 mg Daily Preg: Omnipod insulin pump (Humalog) + Levemir 12 units  Current orders for Inpatient glycemic control: Admitted from PACU cs  Inpatient Diabetes Program Recommendations:    Note last dose of insulin was Levemir 12 units, 6/13, yesterday am.   Consider Novolog 0-9 units Q4 hours and starting metformin 750 mg Daily starting tomorrow 6/14. Pt to use CGM Dexcom (back of left arm) for glucose checks while here.  Spoke with on call Dr. Melba Coon about recs.  Pt to follow up with Endocrinologist (Dr. Chalmers Cater) in 2 weeks.  Thanks,  Tama Headings RN, MSN, BC-ADM Inpatient Diabetes Coordinator Team Pager [phone] (8a-5p)

## 2019-08-03 NOTE — Op Note (Addendum)
Operative Note    Preoperative Diagnosis: IUP at 37 2/7wks                                             Prior myomectomy                                             Type 2 DM   Postoperative Diagnosis: Same                                               Intrauterine adhesions to bladder   Procedure: Primary low transverse cesarean section with double layered closure   Surgeon: Mickle Mallory DO  Assist : RNFA Maida Sale   Anesthesia: Dr Broadus John MD/  Gatha Mayer CRNA  Fluids: LR 3441ml EBL: 1123ml UOP: 260ml   Findings: Viable female infant in vertex position. Apgars 8,9, weight pending Grossly normal uterus with small fibroids, grossly normal fallopian tubes and ovaries Mild adhesions of uterus to bladder    Specimen :Placenta to L/D   Procedure Note  Pt is a type 2 diabetic with good control on insulin and metformin, chrnoic hypertensive with no need for medication in pregnancy and she conceived via IVF with donor sperm. She is also AMA Pt had a history of abdominal myomectomy thus required a cesarean section for delivery at [redacted] week gestation. Procedure was reviewed pre-op. Consent verified    Patient was taken to the operating room where spinal anesthesia was administered. She was prepped and draped in the normal sterile fashion and placed in the dorsal supine position with a leftward tilt. An appropriate time out was performed. A Pfannenstiel skin incision was then made2 finger breths above the pubic symphysis with the scalpel and carried through to the underlying layer of fascia by sharp dissection and Bovie cautery. The fascia was nicked in the midline and the incision was extended laterally with Mayo scissors. The superior then inferior aspects of the incision were grasped kocher clamps and dissected off the underlying rectus muscles.Rectus muscles were separated in the midline  and the peritoneal cavity entered bluntly. The peritoneal incision was then extended both superiorly  and inferiorly with careful attention to avoid both bowel and bladder. The Alexis self-retaining wound retractor was then placed and the lower uterine segment exposed. The bladder flap was noted to be adherent high on the uterus. It was dissected away and  developed with Metzenbaum scissors and pushed away from the lower uterine segment. The lower uterine segment was then incised in a transverse fashion and the cavity itself entered bluntly. The incision was extended bluntly. The infant's head was then lifted and delivered from the incision with assistance of the kiwi vacuum. There were no pop offs.  . Vigorous spontaneous cry was noted during delivery.  The remainder of the infant delivered and the nose and mouth bulb suctioned. The cord was clamped and cut after a minute delay. The infant was handed off to the waiting NICU team. The placenta was then spontaneously expressed from the uterus and the uterus cleared of all clots and debris with moist lap sponge. The uterine incision was  then repaired in 2 layers the first layer was a running locked layer 1-0 chromic and the second an imbricating layer of the same suture. The tubes and ovaries were inspected and the gutters cleared of all clots and debris. The uterine incision was inspected and found to be hemostatic. All instruments and sponges were then removed from the abdomen. The peritoneum was then reapproximated in a running fashion with sutures of 2-0 Vicryl. The rectus muscles were included and reapproximated as well.  The fascia was then closed with 0 Vicryl in a running fashion. The skin was closed with a subcuticular stitch of 4-0 Vicryl on a Keith needle and then reinforced with benzoin and Steri-Strips. At the conclusion of the procedure all instruments and sponge counts were correct. Patient was taken to the recovery room in good condition with her baby accompanying her skin to skin.

## 2019-08-03 NOTE — Lactation Note (Signed)
This note was copied from a baby's chart. Lactation Consultation Note  Patient Name: Christine Best [id number] Date: 08/03/2019 Reason for consult: Initial assessment;Early term 37-38.6wks;Primapara;1st time breastfeeding  P1 mother whose infant is now 27 hours old.  This is an ETI at 37+2 weeks.  Mother was extremely sleepy and could not keep her eyes open during my visit.  Grandmother was present and holding baby.  Per RN, mother was given Benadryl for itching.  RN stated baby has latched a couple of times since birth.    I was not able to thoroughly complete my initial visit due to mother's sleepiness.  Mother was able to verbalize that she needs to practice different positions.  I reassured her that her RN/LC will be able to assist when baby needs to feed again and she is more awake.  Mother fell asleep after this.  Mom made aware of O/P services, breastfeeding support groups, community resources, and our phone # for post-discharge questions.  We will need to continue to assess feedings and to be sure mother does not want to start pumping with the DEBP.  Also assess whether or not she has a DEBP for home use.  RN updated.   Maternal Data Formula Feeding for Exclusion: No Does the patient have breastfeeding experience prior to this delivery?: No  Feeding Feeding Type: Breast Fed  LATCH Score                   Interventions    Lactation Tools Discussed/Used     Consult Status Consult Status: Follow-up Date: 08/04/19 Follow-up type: In-patient    Merl Guardino R Krishay Faro 08/03/2019, 3:55 PM

## 2019-08-03 NOTE — Anesthesia Postprocedure Evaluation (Signed)
Anesthesia Post Note  Patient: Christine Best  Procedure(s) Performed: CESAREAN SECTION (N/A )     Patient location during evaluation: Mother Baby Anesthesia Type: Spinal Level of consciousness: oriented and awake and alert Pain management: pain level controlled Vital Signs Assessment: post-procedure vital signs reviewed and stable Respiratory status: spontaneous breathing and respiratory function stable Cardiovascular status: blood pressure returned to baseline and stable Postop Assessment: no headache, no backache, no apparent nausea or vomiting and able to ambulate Anesthetic complications: no   No complications documented.  Last Vitals:  Vitals:   08/03/19 1500 08/03/19 1607  BP:    Pulse:    Resp: 18 16  Temp:    SpO2:      Last Pain:  Vitals:   08/03/19 1424  TempSrc:   PainSc: 3                  Barnet Glasgow

## 2019-08-03 NOTE — Anesthesia Procedure Notes (Signed)
Spinal  Patient location during procedure: OR Start time: 08/03/2019 9:53 AM End time: 08/03/2019 10:10 AM Staffing Anesthesiologist: Barnet Glasgow, MD Preanesthetic Checklist Completed: patient identified, IV checked, site marked, risks and benefits discussed, surgical consent, monitors and equipment checked, pre-op evaluation and timeout performed Spinal Block Patient position: sitting Prep: DuraPrep Patient monitoring: cardiac monitor, continuous pulse ox, blood pressure and heart rate Approach: midline Location: L3-4 Injection technique: catheter Needle Needle type: Tuohy and Sprotte  Needle gauge: 24 G Needle length: 12.7 cm Needle insertion depth: 10 cm Catheter type: closed end flexible Catheter size: 19 g Catheter at skin depth: 16 cm Assessment Sensory level: T4 Additional Notes 3 attempts due to pt positioning

## 2019-08-03 NOTE — H&P (Addendum)
Christine Best is a 44 y.o.prime female presenting for scheduled primary cesarean section due to history of myomectomy. Pt is dated via IVF. She used donor sperm for pregnancy; also had PGD. She is a type 2 diabetic - managed by Dr Chalmers Cater through pregnancy ; on insulin. Uses a pod. Pt also with mildly elevated BP once in pregnancy; she had been on lisinopril prior to pregnancy but switched to labetalol 100mg . Stopped it in pregnancy. She has otherwise been normotensive and preE workup then was negative. She is AMA. She also had a placenta previa that has since resolved. Essential panel and panorama screen were negative. She is GBS negative.   Current insulin regimen as follows:  metformin 750qhs with evening meal;                                            Increase levemir 100u/ml to 14u sq daily,                                            Continue humalog 100u/ml 100u/day sq via pump OB History    Gravida  1   Para  0   Term  0   Preterm  0   AB  0   Living  0     SAB  0   TAB  0   Ectopic  0   Multiple  0   Live Births             Past Medical History:  Diagnosis Date  . Anemia   . Chronic constipation   . HTN (hypertension)    followed by pcp  . Infertility, female   . Pelvic adhesions   . Type 2 diabetes mellitus (HCC)    followed by pcp;   fasting sugar-- 130   (does not check blood sugar on regular basis)  . Uterine fibroid   . Wears contact lenses    Past Surgical History:  Procedure Laterality Date  . ADENOIDECTOMY  age 25  . HYSTEROSCOPY N/A 11/07/2018   Procedure: HYSTEROSCOPY, endometrial biopsy;  Surgeon: Governor Specking, MD;  Location: [facility];  Service: Gynecology;  Laterality: N/A;  . ROBOT ASSISTED MYOMECTOMY N/A 03/26/2018   Procedure: XI ROBOTIC ASSISTED LAPAROSCOPIC MYOMECTOMY WITH TRANSVAGINAL ULTRASOUND GUIDANCE AND EXCISION OF ENDOMETRIOSIS;  Surgeon: Governor Specking, MD;  Location: [facility];  Service:  Gynecology;  Laterality: N/A;  . ROBOT ASSISTED MYOMECTOMY  01-24-2010   dr Delsa Sale @[facility]    and [facility] HYSTEROSCOPY   Family History: family history includes Cancer in her father and maternal aunt; Diabetes in her brother and mother; Hypertension in her mother. Social History:  reports that she has never smoked. She has never used smokeless tobacco. She reports current alcohol use. She reports that she does not use drugs.     Maternal Diabetes: Yes:  Diabetes Type:  Pre-pregnancy Genetic Screening: Normal Maternal Ultrasounds/Referrals: Normal Fetal Ultrasounds or other Referrals:  Fetal echo wnl Maternal Substance Abuse:  No Significant Maternal Medications:  Meds include: Other: insulin Significant Maternal Lab Results:  Group B Strep negative Other Comments:  None  Review of Systems  Constitutional: Negative for activity change, appetite change, diaphoresis and fatigue.  Eyes: Negative for visual disturbance.  Respiratory:  Negative for chest tightness and shortness of breath.   Cardiovascular: Positive for leg swelling. Negative for chest pain and palpitations.  Gastrointestinal: Negative for abdominal pain.  Genitourinary: Negative for pelvic pain.  Musculoskeletal: Positive for back pain.  Neurological: Negative for headaches.  Psychiatric/Behavioral: Negative for agitation and sleep disturbance. The patient is not nervous/anxious.    Maternal Medical History:  Reason for admission: Scheduled cesarean section  Fetal activity: Perceived fetal activity is normal.   Last perceived fetal movement was within the past hour.    Prenatal Complications - Diabetes: type 2. Diabetes is managed by insulin injections.        Blood pressure 125/82, pulse 89, temperature 97.8 F (36.6 C), temperature source Oral, resp. rate 18, height 5\' 3"  (1.6 m), weight 134.3 kg, last menstrual period 10/18/2018, SpO2 100 %. Maternal Exam:  Uterine Assessment: Contraction frequency is rare.    Abdomen: Patient reports generalized tenderness.  Estimated fetal weight is AGA.   Fetal presentation: vertex  Introitus: Normal vulva. Vulva is negative for condylomata.  Normal vagina.  Pelvis: of concern for delivery.   Cervix: Cervix evaluated by digital exam.     Fetal Exam Fetal Monitor Review: Baseline rate: 120.  Variability: moderate (6-25 bpm).   Pattern: accelerations present and no decelerations.    Fetal State Assessment: Category I - tracings are normal.     Physical Exam  Cardiovascular: Normal pulses.  Respiratory: Effort normal.  GI: Soft. There is generalized abdominal tenderness.  Genitourinary:    Vulva normal.     No vulval condylomata noted.   Musculoskeletal:        General: Normal range of motion.     Cervical back: Normal range of motion.  Neurological: She is alert.  Skin: Skin is warm.  Psychiatric: Her behavior is normal. Mood, judgment and thought content normal.    Prenatal labs: ABO, Rh: --/--/O POS, O POS Performed at [facility], [address], [place], [place] [address]  [phone]) Antibody: NEG (06/12 0908) Rubella: Immune (12/15 0000) RPR: NON REACTIVE (06/12 0908)  HBsAg: Negative (12/15 0000)  HIV: Non-reactive (12/15 0000)  GBS:     Assessment/Plan: 43yo prime at 54 0/7wks with pregnancy via ivf ( donor sperm) ; Type 2 DM, chronic hypertensive - controlled.  -[place] covid neg - SPinal per anesthesia - SCDs to OR - Anceft pre op - Lovenox pp - BP control - Verified consent after reviewing procedure. Pt to OR when ready   Isaiah Serge 08/03/2019, 9:42 AM

## 2019-08-03 NOTE — Transfer of Care (Signed)
Immediate Anesthesia Transfer of Care Note  Patient: [name]  Procedure(s) Performed: CESAREAN SECTION (N/A )  Patient Location: PACU  Anesthesia Type:Spinal  Level of Consciousness: awake, oriented and patient cooperative  Airway & Oxygen Therapy: Patient Spontanous Breathing  Post-op Assessment: Report given to RN and Post -op Vital signs reviewed and stable  Post vital signs: Reviewed and stable  Last Vitals:  Vitals Value Taken Time  BP 110/65 08/03/19 1123  Temp    Pulse 90 08/03/19 1128  Resp 16 08/03/19 1128  SpO2 98 % 08/03/19 1128  Vitals shown include unvalidated device data.  Last Pain:  Vitals:   08/03/19 0841  TempSrc: Oral         Complications: No complications documented.

## 2019-08-04 ENCOUNTER — Encounter (HOSPITAL_COMMUNITY): Payer: Self-pay | Admitting: Obstetrics and Gynecology

## 2019-08-04 LAB — CBC
HCT: 26.1 % — ABNORMAL LOW (ref 36.0–46.0)
Hemoglobin: 8 g/dL — ABNORMAL LOW (ref 12.0–15.0)
MCH: 22.9 pg — ABNORMAL LOW (ref 26.0–34.0)
MCHC: 30.7 g/dL (ref 30.0–36.0)
MCV: 74.8 fL — ABNORMAL LOW (ref 80.0–100.0)
Platelets: 209 10*3/uL (ref 150–400)
RBC: 3.49 MIL/uL — ABNORMAL LOW (ref 3.87–5.11)
RDW: 15.8 % — ABNORMAL HIGH (ref 11.5–15.5)
WBC: 6.9 10*3/uL (ref 4.0–10.5)
nRBC: 0 % (ref 0.0–0.2)

## 2019-08-04 LAB — GLUCOSE, CAPILLARY: Glucose-Capillary: 160 mg/dL — ABNORMAL HIGH (ref 70–99)

## 2019-08-04 LAB — BIRTH TISSUE RECOVERY COLLECTION (PLACENTA DONATION)

## 2019-08-04 MED ORDER — ACETAMINOPHEN 325 MG PO TABS
650.0000 mg | ORAL_TABLET | ORAL | Status: DC | PRN
  Administered 2019-08-04 – 2019-08-05 (×2): 650 mg via ORAL
  Filled 2019-08-04 (×2): qty 2

## 2019-08-04 NOTE — Progress Notes (Signed)
Subjective: Postpartum Day 1: Cesarean Delivery Patient reports tolerating PO, + flatus and no problems voiding. Lochia mild. Pt tired as baby was cluster feeding last night; otherwise well. She denies HA, blurry vision, SOB or CP. She request use of her own monitor for glucose reads rather than needle sticks    Objective: Vital signs in last 24 hours: Temp:  [97.4 F (36.3 C)-98.4 F (36.9 C)] 97.6 F (36.4 C) (06/15 0543) Pulse Rate:  [71-97] 76 (06/15 0543) Resp:  [11-20] 18 (06/15 0543) BP: (101-132)/(61-82) 114/63 (06/15 0543) SpO2:  [96 %-100 %] 98 % (06/15 0543)  Physical Exam:  General: alert, cooperative, fatigued and no distress Lochia: appropriate Uterine Fundus: firm Incision: no significant drainage DVT Evaluation: No evidence of DVT seen on physical exam.  Recent Labs    08/03/19 1316 08/04/19 0605  HGB 9.6* 8.0*  HCT 31.4* 26.1*    Assessment/Plan: Status post Cesarean section. Doing well postoperatively.  Continue current care May use own monitor. Reassured regarding baby well being; lactation consult   Isaiah Serge 08/04/2019, 9:35 AM

## 2019-08-04 NOTE — Progress Notes (Signed)
Sliding scale insulin not needed, blood sugar is 94.  Christine Best Christine Best Therapist, sports

## 2019-08-04 NOTE — Lactation Note (Signed)
This note was copied from a baby's chart. Lactation Consultation Note  Patient Name: Christine Best [id number] Date: 08/04/2019 Reason for consult: Follow-up assessment Baby 25hrs old, wt loss 4%. Mom sitting in recliner attempting to latch baby to right breast, dad sitting on couch. Mom reports baby stooled prior to this [place] entering room. Taught hand expression, mom expressed drops of colostrum from right breast with assistance. Mom easily latches baby to breast with some positioning tips from LC, lips flanged, swallows noted, advised get baby skin to skin with each feeding at the breast, discussed benefits. Baby nursed total 37min (~57min with LC + per RN ~46min prior to [facility] entering room), baby content skin to skin with mom before falling asleep. Mom prefers cross cradle vs football hold. Discussed preference if supplementation needed, mom prefers to BF only, however will use formula if needed, discussed donor milk benefits, mom declined. Discussed signs of adequate milk transfer, signs of good feeding vs poor, reinforced skin to skin, advised mom to call for [facility] support with next feeding if needed, if next BF <29min recommend supplementation. Discussed plan with RN. BGilliam, RN, IBCLC  Maternal Data Has patient been taught Hand Expression?: Yes Does the patient have breastfeeding experience prior to this delivery?: No  Feeding Feeding Type: Breast Fed  LATCH Score Latch: Grasps breast easily, tongue down, lips flanged, rhythmical sucking.  Audible Swallowing: A few with stimulation  Type of Nipple: Everted at rest and after stimulation  Comfort (Breast/Nipple): Filling, red/small blisters or bruises, mild/mod discomfort  Hold (Positioning): Assistance needed to correctly position infant at breast and maintain latch.  LATCH Score: 7  Interventions Interventions: Assisted with latch;Skin to skin;Breast compression;Adjust position;Support pillows;Position options  Lactation Tools  Discussed/Used WIC Program: No   Consult Status Consult Status: Follow-up Date: 08/04/19 Follow-up type: In-patient    Christine Best 08/04/2019, 5:42 PM

## 2019-08-04 NOTE — Lactation Note (Addendum)
This note was copied from a baby's chart. Lactation Consultation Note  Patient Name: Christine Best [id number] Date: 08/04/2019 Reason for consult: Follow-up assessment P1, 36 hour ,ETI female infant. Infant with 4% weight loss within the first 24 hours.  LC entered room, mom in bathroom and grandmother was  holding infant. Infant was very fussy and hungry at the time, LC had infant suck on a  gloved finger to calm infant  down and wait for mom to leave bathroom. Mom came out of bathroom  room, LC greeted mom and explain LC is here from [facility]  services to help assist with latch. Per mom, she is in a lot of pain from C/S, she cannot get in the  bed nor sit in a chair to fed infant. Mom proceed to  latch infant on the side of the  bed, no pillow support ,  bent over using a  scissor hand position. LC suggested mom to  use pillows under infant to help support with latch. Mom immediately as LC to leave room. LC apologize to mom, if she  felt upset with LC suggestions, LC was trying to  help mom with latching infant at breast with good position instead of infant being on mom's nipple tip with shallow latch. [place] alert RN that per mom, she is in a lot of pain. LC mention if she felt she need help in the future we are here to support her breastfeeding efforts.   Maternal Data    Feeding Feeding Type: Breast Fed  LATCH Score Latch: Repeated attempts needed to sustain latch, nipple held in mouth throughout feeding, stimulation needed to elicit sucking reflex.  Audible Swallowing: A few with stimulation  Type of Nipple: Flat  Comfort (Breast/Nipple): Soft / non-tender  Hold (Positioning): Assistance needed to correctly position infant at breast and maintain latch.  LATCH Score: 6  Interventions    Lactation Tools Discussed/Used     Consult Status Consult Status: Follow-up Date: 08/05/19 Follow-up type: In-patient    Christine Best 08/04/2019, 11:01 PM

## 2019-08-05 ENCOUNTER — Other Ambulatory Visit (HOSPITAL_COMMUNITY): Payer: Self-pay | Admitting: Obstetrics and Gynecology

## 2019-08-05 MED ORDER — METFORMIN HCL 500 MG PO TABS: 750.0000 mg | ORAL_TABLET | Freq: Every day | ORAL | 6 refills | Status: DC

## 2019-08-05 MED ORDER — IBUPROFEN 800 MG PO TABS: 800.0000 mg | ORAL_TABLET | Freq: Three times a day (TID) | ORAL | 0 refills | Status: DC

## 2019-08-05 MED ORDER — OXYCODONE HCL 5 MG PO TABS: 5.0000 mg | ORAL_TABLET | ORAL | 0 refills | Status: DC | PRN

## 2019-08-05 MED FILL — IBUPROFEN 800 MG TAB: 800 | 10 days supply | Qty: 30 | Fill #0

## 2019-08-05 MED FILL — oxyCODONE HCL 5 MG TABS: 5 | 2 days supply | Qty: 10 | Fill #0

## 2019-08-05 NOTE — Progress Notes (Signed)
POD #2 LTCS, Type 2 DM Doing well, pain improved, wants to go home Afeb, VSS Abd- soft, fundus firm, incision intact [id number] D/c home today

## 2019-08-05 NOTE — Discharge Instructions (Signed)
As per discharge summary

## 2019-08-05 NOTE — Discharge Summary (Signed)
Postpartum Discharge Summary      Patient Name: Christine Best DOB: [birth year] MRN: [id number]  Date of admission: 08/03/2019 Delivery date:08/03/2019  Delivering provider: Sherlyn Hay  Date of discharge: 08/05/2019  Admitting diagnosis: Indication for care in labor or delivery [O75.9] Intrauterine pregnancy: [redacted]w[redacted]d     Secondary diagnosis:  Active Problems:   Indication for care in labor or delivery   Status post primary low transverse cesarean section    Discharge diagnosis: Term Pregnancy Delivered, Type 2 DM and previous myomectomy                                              Hospital course: Sceduled C/S   44 y.o. yo G1P1001 at [redacted]w[redacted]d was admitted to the hospital 08/03/2019 for scheduled cesarean section with the following indication:Prior Uterine Surgery.Delivery details are as follows:  Membrane Rupture Time/Date: 10:35 AM ,08/03/2019   Delivery Method:C-Section, Vacuum Assisted  Details of operation can be found in separate operative note.  Patient had an uncomplicated postpartum course. Restarted on Levemir  And Metformin for Type 2 DM with adequate control  She is ambulating, tolerating a regular diet, passing flatus, and urinating well. Patient is discharged home in stable condition on  08/05/19        Newborn Data: Birth date:08/03/2019  Birth time:10:37 AM  Gender:Female  Living status:Living  Apgars:8 ,10  Weight:3702 g      Physical exam  Vitals:   08/04/19 1300 08/04/19 2030 08/04/19 2119 08/05/19 0531  BP: 109/61 136/69 125/62 109/62  Pulse: 77 87 75 68  Resp: 18 18 16 18   Temp: 98.2 F (36.8 C) 98.3 F (36.8 C) 98.3 F (36.8 C) 98.1 F (36.7 C)  TempSrc: Oral Oral Oral Oral  SpO2: 99% 100% 99% 98%  Weight:      Height:       General: alert Lochia: appropriate Uterine Fundus: firm Incision: Healing well with no significant drainage  Labs: Lab Results  Component Value Date   WBC 6.9 08/04/2019   HGB 8.0 (L) 08/04/2019   HCT 26.1 (L)  08/04/2019   MCV 74.8 (L) 08/04/2019   PLT 209 08/04/2019   CMP Latest Ref Rng & Units 08/03/2019  Glucose 70 - 99 mg/dL -  BUN 6 - 20 mg/dL -  Creatinine 0.44 - 1.00 mg/dL 0.63  Sodium 135 - 145 mmol/L -  Potassium 3.5 - 5.1 mmol/L -  Chloride 98 - 111 mmol/L -  CO2 22 - 32 mmol/L -  Calcium 8.9 - 10.3 mg/dL -  Total Protein 6.5 - 8.1 g/dL -  Total Bilirubin 0.3 - 1.2 mg/dL -  Alkaline Phos 38 - 126 U/L -  AST 15 - 41 U/L -  ALT 0 - 44 U/L -   Edinburgh Score: Edinburgh Postnatal Depression Scale Screening Tool 08/03/2019  I have been able to laugh and see the funny side of things. 0  I have looked forward with enjoyment to things. 0  I have blamed myself unnecessarily when things went wrong. 0  I have been anxious or worried for no good reason. 0  I have felt scared or panicky for no good reason. 0  Things have been getting on top of me. 0  I have been so unhappy that I have had difficulty sleeping. 0  I have felt sad or miserable. 0  I have been so unhappy that I have been crying. 0  The thought of harming myself has occurred to me. 0  Edinburgh Postnatal Depression Scale Total 0      After visit meds:  Allergies as of 08/05/2019      Reactions   Lisinopril Swelling, Other (See Comments)   angioedema      Medication List    TAKE these medications   ibuprofen 800 MG tablet Commonly known as: ADVIL Take 1 tablet (800 mg total) by mouth every 8 (eight) hours.   insulin detemir 100 UNIT/ML injection Commonly known as: LEVEMIR Inject into the skin daily. Pump   metFORMIN 500 MG tablet Commonly known as: GLUCOPHAGE Take 1.5 tablets (750 mg total) by mouth daily with breakfast. Start taking on: August 06, 2019   oxyCODONE 5 MG immediate release tablet Commonly known as: Oxy IR/ROXICODONE Take 1 tablet (5 mg total) by mouth every 4 (four) hours as needed for severe pain.        Discharge home in stable condition Infant Feeding: Breast Infant Disposition:home  with mother Discharge instruction: per After Visit Summary and Postpartum booklet. Activity: Advance as tolerated. Pelvic rest for 6 weeks.  Diet: carb modified diet  Postpartum Appointment:2 weeks Follow up Visit:  [place], [place], DO. Schedule an appointment as soon as possible for a visit in 2 week(s).   Specialty: Obstetrics and Gynecology Contact information: [address] [place] [address] [phone]                   08/05/2019 Clarene Duke, MD

## 2019-08-05 NOTE — Lactation Note (Addendum)
This note was copied from a baby's chart. Lactation Consultation Note  Patient Name: Christine Best [id number] Date: 08/05/2019 Reason for consult: Follow-up assessment   P1, Baby 77 hours old.  Mother hand expressed drops prior to latching on L side.  Mother states baby recently bf on R side for 20 min. Assisted with positioning baby on L side with pillows and cross cradle for head support and depth. Recommend mother post pump with DEBP at home in addition to bf to supplement infant and increase her milk supply.  Mother's nipples tender.  Provided comfort gels.  Mother has DEBP at home. Feed on demand with cues.  Goal 8-12+ times per day after first 24 hrs.  Place baby STS if not cueing.  Reviewed engorgement care and monitoring voids/stools.    Maternal Data Has patient been taught Hand Expression?: Yes  Feeding Feeding Type: Breast Fed  LATCH Score Latch: Repeated attempts needed to sustain latch, nipple held in mouth throughout feeding, stimulation needed to elicit sucking reflex.  Audible Swallowing: A few with stimulation  Type of Nipple: Everted at rest and after stimulation  Comfort (Breast/Nipple): Filling, red/small blisters or bruises, mild/mod discomfort  Hold (Positioning): Assistance needed to correctly position infant at breast and maintain latch.  LATCH Score: 6  Interventions Interventions: Breast feeding basics reviewed;Assisted with latch;Hand express;Comfort gels;Position options;Support pillows;Adjust position;Breast compression  Lactation Tools Discussed/Used     Consult Status Consult Status: Complete Date: 08/05/19    Vivianne Master [facility] 08/05/2019, 11:00 AM

## 2019-08-10 ENCOUNTER — Telehealth (HOSPITAL_COMMUNITY): Payer: Self-pay

## 2019-08-10 ENCOUNTER — Other Ambulatory Visit: Payer: Self-pay | Admitting: *Deleted

## 2019-08-10 NOTE — Telephone Encounter (Signed)
Baby girl Christine Best now 1 week.  Mom reports she started giving Christine Best bottles at night and that now Christine Best will not latch.  Mom reports she is really sad.  Discussed ways of trying to get Hayley to latch to the breast. Urged mom to do lots of STS.  Urged mom to try and feed her as soon as she starts showing hunger cues.  Discussed switch nursing. Trying to give first part of feeding from bottle close to mom and then switch to breasts  Discussed trying a nipple shield if she completely refuses all other suggestions.  Discuseed also following up with outpatient lactation.  Urged mom to follow up with lactation as needed.

## 2019-08-10 NOTE — Patient Outreach (Signed)
[place] [facility]) Care Management  08/10/2019  LAKEA MITTELMAN Apr 20, 1975 [id number]   Transition of care call/case closure   Referral received:08/05/19 Initial outreach:08/10/19 Insurance: [place] [employer]    Subjective: Outreach call to [place] to complete transition of care call and to close case and transfer to ongoing Diabetes disease services back  to [facility].  Marcie Bal states that she is doing good as well a baby, denies post-operative problems, says surgical incisions are unremarkable, states surgical pain well managed with prescribed medications, tolerating diet, denies bowel or bladder problems.  She has her mother are assisting in her recovery. Nayzeth states that her blood sugars are running in the normal range, she is no longer requiring use of insulin pump, blood sugars managed by metformin. She reports having follow up with Dr. Bubba Camp in the next 2 weeks.  Objective:  Geonna Lockyer was hospitalized at [facility] 6/14-6/16/21 for Cesarean Section Delivery of baby girl on 08/03/19. Comorbidities include: Type type 2, She was discharged to home on 08/05/19 without the need for home health services or DME.   Assessment:  Patient voices good understanding of all discharge instructions.  See transition of care flowsheet for assessment details.   Plan:  Reviewed hospital discharge diagnosis of [place]  Section  and discharge treatment plan using hospital discharge instructions, assessing medication adherence, reviewing problems requiring provider notification, and discussing the importance of follow up with OB, endocrinologist  and/or specialists as directed.   Using [facility] website, placed referral to [facility] for that patient to resume  participation  in [place] [facility] chronic disease management program.    No ongoing care management needs identified so will close case to [place] [facility] services.    Joylene Draft, RN, BSN  [place] Management Coordinator  [phone]- Mobile [phone]- Toll Free Main Office

## 2019-08-17 MED FILL — LEVEMIR FLEXTOUCH 100 UNITS: 100 | 30 days supply | Qty: 3 | Fill #1

## 2019-08-18 ENCOUNTER — Other Ambulatory Visit: Payer: Self-pay | Admitting: Family Medicine

## 2019-08-18 DIAGNOSIS — O99891 Other specified diseases and conditions complicating pregnancy: Secondary | ICD-10-CM | POA: Diagnosis not present

## 2019-08-18 MED FILL — HYDROCHLOROTHIAZIDE 25 MG T: 25 | 90 days supply | Qty: 90 | Fill #0

## 2019-08-21 DIAGNOSIS — I10 Essential (primary) hypertension: Secondary | ICD-10-CM | POA: Diagnosis not present

## 2019-08-21 DIAGNOSIS — K649 Unspecified hemorrhoids: Secondary | ICD-10-CM | POA: Diagnosis not present

## 2019-08-21 DIAGNOSIS — O24112 Pre-existing diabetes mellitus, type 2, in pregnancy, second trimester: Secondary | ICD-10-CM | POA: Diagnosis not present

## 2019-08-21 DIAGNOSIS — Z3A19 19 weeks gestation of pregnancy: Secondary | ICD-10-CM | POA: Diagnosis not present

## 2019-08-21 MED FILL — HYDROCORTISONE AC 25 MG SUP: 25 | 14 days supply | Qty: 28 | Fill #0

## 2019-08-21 MED FILL — HYDROCORT-PRAMOXINE 2.5-1%: 2.5-1 | 10 days supply | Qty: 30 | Fill #0

## 2019-08-22 ENCOUNTER — Inpatient Hospital Stay (HOSPITAL_COMMUNITY): Admission: AD | Admit: 2019-08-22 | Payer: 59 | Source: Home / Self Care

## 2019-08-25 DIAGNOSIS — E1165 Type 2 diabetes mellitus with hyperglycemia: Secondary | ICD-10-CM | POA: Diagnosis not present

## 2019-09-19 ENCOUNTER — Telehealth (HOSPITAL_COMMUNITY): Payer: Self-pay

## 2019-09-19 NOTE — Telephone Encounter (Signed)
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, children's)  - latched well in the hospital  - baby has been feeding exclusively from the bottle x6weeks  - mom states baby will no longer latch to breast  - mom pumps q2hrs, obtains 6oz of milk per breast  - mom tried using a nipple shield to get baby to re-latch (unsuccessful)  - mom now with tender nipples, mom thinks r/t wrong size nipple shield and baby trying to suck vigorously to get milk out  - baby consumes 4-5oz per feeding, 4-5x a day  - mom states baby is gaining weight well and content after feedings   Mom's goal  - pump only when away from baby  - latch and BF when with baby  - donate excess breast milk   Message sent to [place] [facility]/ Nonah Mattes for [facility] outpatient appt. BGilliam, RN, IBCLC

## 2019-09-24 DIAGNOSIS — E109 Type 1 diabetes mellitus without complications: Secondary | ICD-10-CM | POA: Diagnosis not present

## 2019-09-29 DIAGNOSIS — Z3009 Encounter for other general counseling and advice on contraception: Secondary | ICD-10-CM | POA: Diagnosis not present

## 2019-09-29 DIAGNOSIS — Z1389 Encounter for screening for other disorder: Secondary | ICD-10-CM | POA: Diagnosis not present

## 2019-10-03 ENCOUNTER — Encounter: Payer: Self-pay | Admitting: *Deleted

## 2019-10-15 ENCOUNTER — Encounter: Payer: Self-pay | Admitting: Family Medicine

## 2019-10-15 ENCOUNTER — Other Ambulatory Visit: Payer: Self-pay

## 2019-10-15 ENCOUNTER — Ambulatory Visit: Payer: 59 | Admitting: Family Medicine

## 2019-10-15 ENCOUNTER — Other Ambulatory Visit: Payer: Self-pay | Admitting: Family Medicine

## 2019-10-15 VITALS — BP 134/80 | HR 69 | Temp 97.9°F | Ht 61.0 in | Wt 256.4 lb

## 2019-10-15 DIAGNOSIS — I10 Essential (primary) hypertension: Secondary | ICD-10-CM | POA: Diagnosis not present

## 2019-10-15 DIAGNOSIS — E1165 Type 2 diabetes mellitus with hyperglycemia: Secondary | ICD-10-CM

## 2019-10-15 MED ORDER — AMLODIPINE BESYLATE 5 MG PO TABS
5.0000 mg | ORAL_TABLET | Freq: Every day | ORAL | 3 refills | Status: DC
Start: 2019-10-15 — End: 2019-10-15

## 2019-10-15 MED FILL — METFORMIN HCL 500 MG TABS: 500 | 30 days supply | Qty: 45 | Fill #0

## 2019-10-15 MED FILL — AMLODIPINE BESYLATE 5 MG TA: 5 | 30 days supply | Qty: 30 | Fill #0

## 2019-10-15 NOTE — Progress Notes (Signed)
8/26/20219:53 AM  [name] August 22, 1975, 44 y.o., female [id number]  Chief Complaint  Patient presents with  . Medical Management of Chronic Issues    medication refills and check up. DM Foot exam has been completed today. was unable to document under foot exam.    HPI:   Patient is a 44 y.o. female with past medical history significant for DM2, HTN  who presents today for followup  Previous PCP Dr Nolon Rod Sees Dr Chalmers Cater for DM2 Dr Terri Piedra is her obgyn - started HCTZ Gave birth June 2021, she is breastfeeding She not taken HCTZ today but took yesterday, struggling with taking daily due to increase urination interfering with her sleep She has not been checking BP at home  Patient reports that lisinopril was stared by previous pcp for renal protection from DM and not really for HTN She had angioedema with lisinopril She did not have BP issues with recent pregnancy   Lab Results  Component Value Date   HGBA1C 7.8 (H) 08/03/2019   HGBA1C 7.0 (H) 12/29/2018   HGBA1C 6.8 (H) 07/30/2018   Lab Results  Component Value Date   LDLCALC 97 09/03/2017   CREATININE 0.63 08/03/2019    Depression screen PHQ 2/9 10/15/2019 03/09/2019 02/18/2019  Decreased Interest 0 0 0  Down, Depressed, Hopeless 0 0 0  PHQ - 2 Score 0 0 0  Altered sleeping - - -  Tired, decreased energy - - -  Change in appetite - - -  Feeling bad or failure about yourself  - - -  Trouble concentrating - - -  Moving slowly or fidgety/restless - - -  Suicidal thoughts - - -  PHQ-9 Score - - -  Difficult doing work/chores - - -    Fall Risk  10/15/2019 03/09/2019 02/18/2019 12/29/2018 07/30/2018  Falls in the past year? 0 0 0 0 0  Number falls in past yr: 0 - - 0 -  Injury with Fall? - - - 0 0  Follow up Falls evaluation completed - - - -     Allergies  Allergen Reactions  . Lisinopril Swelling and Other (See Comments)    angioedema    Prior to Admission medications   Medication Sig Start Date End  Date Taking? Authorizing Provider  hydrochlorothiazide (HYDRODIURIL) 25 MG tablet Take 25 mg by mouth daily.   Yes [provider]  metFORMIN (GLUCOPHAGE) 500 MG tablet Take 1.5 tablets (750 mg total) by mouth daily with breakfast. 08/06/19  Yes Meisinger, Sherren Mocha, MD    Past Medical History:  Diagnosis Date  . Anemia   . Chronic constipation   . Delivery with history of C-section 08/03/2019  . HTN (hypertension)    followed by pcp  . Infertility, female   . Pelvic adhesions   . Type 2 diabetes mellitus (HCC)    followed by pcp;   fasting sugar-- 130   (does not check blood sugar on regular basis)  . Uterine fibroid   . Wears contact lenses     Past Surgical History:  Procedure Laterality Date  . ADENOIDECTOMY  age 87  . CESAREAN SECTION N/A 08/03/2019   Procedure: CESAREAN SECTION;  Surgeon: Sherlyn Hay, DO;  Location: [facility] LD ORS;  Service: Obstetrics;  Laterality: N/A;  Tracey RNFA  . HYSTEROSCOPY N/A 11/07/2018   Procedure: HYSTEROSCOPY, endometrial biopsy;  Surgeon: Governor Specking, MD;  Location: [facility];  Service: Gynecology;  Laterality: N/A;  . ROBOT ASSISTED MYOMECTOMY N/A 03/26/2018  Procedure: XI ROBOTIC ASSISTED LAPAROSCOPIC MYOMECTOMY WITH TRANSVAGINAL ULTRASOUND GUIDANCE AND EXCISION OF ENDOMETRIOSIS;  Surgeon: Governor Specking, MD;  Location: [facility];  Service: Gynecology;  Laterality: N/A;  . ROBOT ASSISTED MYOMECTOMY  01-24-2010   dr Delsa Sale @[facility]    and [facility] HYSTEROSCOPY    Social History   Tobacco Use  . Smoking status: Never Smoker  . Smokeless tobacco: Never Used  Substance Use Topics  . Alcohol use: Yes    Alcohol/week: 0.0 standard drinks    Comment: occasional    Family History  Problem Relation Age of Onset  . Diabetes Mother   . Hypertension Mother   . Cancer Father   . Diabetes Brother   . Cancer Maternal Aunt        type unknown    Review of Systems  Constitutional: Negative for  chills and fever.  Respiratory: Negative for cough and shortness of breath.   Cardiovascular: Negative for chest pain, palpitations and leg swelling.  Gastrointestinal: Negative for abdominal pain, nausea and vomiting.     OBJECTIVE:  Today's Vitals   10/15/19 0929 10/15/19 0954  BP: (!) 160/100 134/80  Pulse: 69   Temp: 97.9 F (36.6 C)   TempSrc: Temporal   SpO2: 97%   Weight: 256 lb 6.4 oz (116.3 kg)   Height: 5\' 1"  (1.549 m)    Body mass index is 48.45 kg/m.   Physical Exam Vitals and nursing note reviewed.  Constitutional:      Appearance: She is well-developed.  HENT:     Head: Normocephalic and atraumatic.     Mouth/Throat:     Pharynx: No oropharyngeal exudate.  Eyes:     General: No scleral icterus.    Extraocular Movements: Extraocular movements intact.     Conjunctiva/sclera: Conjunctivae normal.     Pupils: Pupils are equal, round, and reactive to light.  Cardiovascular:     Rate and Rhythm: Normal rate and regular rhythm.     Heart sounds: Normal heart sounds. No murmur heard.  No friction rub. No gallop.   Pulmonary:     Effort: Pulmonary effort is normal.     Breath sounds: Normal breath sounds. No wheezing, rhonchi or rales.  Musculoskeletal:     Cervical back: Neck supple.  Skin:    General: Skin is warm and dry.  Neurological:     Mental Status: She is alert and oriented to person, place, and time.     No results found for this or any previous visit (from the past 24 hour(s)).  No results found.   ASSESSMENT and PLAN  1. Essential hypertension Changing HCTZ to amlodipine, reviewed lactamed. Discussed ambulatory BP monitoring and RTC precautions. - Lipid Panel - Comprehensive metabolic panel  2. Type 2 diabetes mellitus with hyperglycemia, without long-term current use of insulin ([place]) Managed by endo.  - Microalbumin / creatinine urine ratio - Ambulatory referral to Ophthalmology - [facility] DIABETES FOOT EXAM - Lipid Panel -  Comprehensive metabolic panel  Other orders - amLODipine (NORVASC) 5 MG tablet; Take 1 tablet (5 mg total) by mouth daily.  Return in about 4 weeks (around 11/12/2019) for CPE.    Rutherford Guys, MD [facility] at [facility] [place], [place] [address] Ph.  [phone] Fax [phone]

## 2019-10-15 NOTE — Patient Instructions (Signed)
° ° ° °  If you have lab work done today you will be contacted with your lab results within the next 2 weeks.  If you have not heard from us then please contact us. The fastest way to get your results is to register for My Chart. ° ° °IF you received an x-ray today, you will receive an invoice from [facility]. Please contact [facility] at [phone] with questions or concerns regarding your invoice.  ° °IF you received labwork today, you will receive an invoice from [facility]. Please contact [facility] at [phone] with questions or concerns regarding your invoice.  ° °Our billing staff will not be able to assist you with questions regarding bills from these companies. ° °You will be contacted with the lab results as soon as they are available. The fastest way to get your results is to activate your My Chart account. Instructions are located on the last page of this paperwork. If you have not heard from us regarding the results in 2 weeks, please contact this office. °  ° ° ° °

## 2019-10-16 ENCOUNTER — Ambulatory Visit: Payer: 59 | Admitting: Family Medicine

## 2019-10-16 LAB — MICROALBUMIN / CREATININE URINE RATIO
Creatinine, Urine: 138 mg/dL
Microalb/Creat Ratio: 7 mg/g creat (ref 0–29)
Microalbumin, Urine: 9.7 ug/mL

## 2019-10-19 DIAGNOSIS — E119 Type 2 diabetes mellitus without complications: Secondary | ICD-10-CM | POA: Diagnosis not present

## 2019-10-19 LAB — HM DIABETES EYE EXAM

## 2019-10-30 DIAGNOSIS — E1165 Type 2 diabetes mellitus with hyperglycemia: Secondary | ICD-10-CM | POA: Diagnosis not present

## 2019-10-30 DIAGNOSIS — Z794 Long term (current) use of insulin: Secondary | ICD-10-CM | POA: Diagnosis not present

## 2019-12-21 MED FILL — LEVEMIR FLEXTOUCH 100 UNITS: 100 | 30 days supply | Qty: 3 | Fill #2

## 2019-12-21 MED FILL — METFORMIN HCL 500 MG TABS: 500 | 30 days supply | Qty: 45 | Fill #1

## 2019-12-21 MED FILL — AMLODIPINE BESYLATE 5 MG TA: 5 | 30 days supply | Qty: 30 | Fill #1

## 2020-01-27 DIAGNOSIS — Z794 Long term (current) use of insulin: Secondary | ICD-10-CM | POA: Diagnosis not present

## 2020-01-27 DIAGNOSIS — E1165 Type 2 diabetes mellitus with hyperglycemia: Secondary | ICD-10-CM | POA: Diagnosis not present

## 2020-02-23 MED FILL — METFORMIN HCL 500 MG TABS: 500 | 30 days supply | Qty: 45 | Fill #2

## 2020-02-23 MED FILL — AMLODIPINE BESYLATE 5 MG TA: 5 | 30 days supply | Qty: 30 | Fill #2

## 2020-02-23 MED FILL — LEVEMIR FLEXTOUCH 100 UNITS: 100 | 30 days supply | Qty: 3 | Fill #3

## 2020-03-21 ENCOUNTER — Ambulatory Visit: Payer: 59 | Admitting: Family Medicine

## 2020-04-19 DIAGNOSIS — Z124 Encounter for screening for malignant neoplasm of cervix: Secondary | ICD-10-CM | POA: Diagnosis not present

## 2020-04-19 DIAGNOSIS — Z1389 Encounter for screening for other disorder: Secondary | ICD-10-CM | POA: Diagnosis not present

## 2020-04-19 DIAGNOSIS — E119 Type 2 diabetes mellitus without complications: Secondary | ICD-10-CM | POA: Diagnosis not present

## 2020-04-19 DIAGNOSIS — Z136 Encounter for screening for cardiovascular disorders: Secondary | ICD-10-CM | POA: Diagnosis not present

## 2020-04-19 DIAGNOSIS — Z130 Encounter for screening for diseases of the blood and blood-forming organs and certain disorders involving the immune mechanism: Secondary | ICD-10-CM | POA: Diagnosis not present

## 2020-04-19 DIAGNOSIS — Z01419 Encounter for gynecological examination (general) (routine) without abnormal findings: Secondary | ICD-10-CM | POA: Diagnosis not present

## 2020-04-19 DIAGNOSIS — Z13220 Encounter for screening for lipoid disorders: Secondary | ICD-10-CM | POA: Diagnosis not present

## 2020-04-19 DIAGNOSIS — Z13228 Encounter for screening for other metabolic disorders: Secondary | ICD-10-CM | POA: Diagnosis not present

## 2020-04-19 DIAGNOSIS — I10 Essential (primary) hypertension: Secondary | ICD-10-CM | POA: Diagnosis not present

## 2020-04-19 DIAGNOSIS — Z6841 Body Mass Index (BMI) 40.0 and over, adult: Secondary | ICD-10-CM | POA: Diagnosis not present

## 2020-04-19 DIAGNOSIS — Z1151 Encounter for screening for human papillomavirus (HPV): Secondary | ICD-10-CM | POA: Diagnosis not present

## 2020-04-20 DIAGNOSIS — Z1151 Encounter for screening for human papillomavirus (HPV): Secondary | ICD-10-CM | POA: Diagnosis not present

## 2020-04-20 DIAGNOSIS — Z124 Encounter for screening for malignant neoplasm of cervix: Secondary | ICD-10-CM | POA: Diagnosis not present

## 2020-05-12 ENCOUNTER — Other Ambulatory Visit (HOSPITAL_BASED_OUTPATIENT_CLINIC_OR_DEPARTMENT_OTHER): Payer: Self-pay

## 2020-06-16 ENCOUNTER — Other Ambulatory Visit: Payer: Self-pay

## 2020-06-16 ENCOUNTER — Other Ambulatory Visit (HOSPITAL_COMMUNITY): Payer: Self-pay

## 2020-06-16 ENCOUNTER — Encounter: Payer: Self-pay | Admitting: Family Medicine

## 2020-06-16 ENCOUNTER — Ambulatory Visit (INDEPENDENT_AMBULATORY_CARE_PROVIDER_SITE_OTHER): Payer: 59 | Admitting: Family Medicine

## 2020-06-16 VITALS — BP 130/88 | HR 84 | Temp 97.5°F | Ht 61.0 in | Wt 273.4 lb

## 2020-06-16 DIAGNOSIS — E118 Type 2 diabetes mellitus with unspecified complications: Secondary | ICD-10-CM

## 2020-06-16 DIAGNOSIS — Z0001 Encounter for general adult medical examination with abnormal findings: Secondary | ICD-10-CM

## 2020-06-16 DIAGNOSIS — I10 Essential (primary) hypertension: Secondary | ICD-10-CM

## 2020-06-16 MED ORDER — OZEMPIC (0.25 OR 0.5 MG/DOSE) 2 MG/1.5ML ~~LOC~~ SOPN
0.2500 mg | PEN_INJECTOR | SUBCUTANEOUS | 3 refills | Status: DC
  Filled 2020-06-16: qty 1.5, 56d supply, fill #0
  Filled 2020-08-09: qty 1.5, 56d supply, fill #1

## 2020-06-16 MED ORDER — DEXCOM G6 TRANSMITTER MISC
5 refills | Status: DC
  Filled 2020-06-16 – 2020-06-20 (×2): qty 1, 90d supply, fill #0

## 2020-06-16 MED ORDER — DEXCOM G6 RECEIVER DEVI
5 refills | Status: DC
  Filled 2020-06-16: qty 1, 90d supply, fill #0

## 2020-06-16 MED ORDER — DEXCOM G6 SENSOR MISC
5 refills | Status: DC
  Filled 2020-06-16 – 2020-06-20 (×2): qty 3, 30d supply, fill #0

## 2020-06-16 MED ORDER — AMLODIPINE BESYLATE 5 MG PO TABS
ORAL_TABLET | Freq: Every day | ORAL | 3 refills | Status: DC
  Filled 2020-06-16: qty 90, 90d supply, fill #0

## 2020-06-16 NOTE — Addendum Note (Signed)
Addended by: Vivi Barrack on: 06/16/2020 08:44 AM   Modules accepted: Level of Service

## 2020-06-16 NOTE — Assessment & Plan Note (Signed)
BMI 51.  Will start Ozempic which should help with this.  Continue lifestyle modifications.

## 2020-06-16 NOTE — Patient Instructions (Signed)
It was very nice to see you today!  We will start Ozempic today.  No other changes.  Please come back in 3 months to recheck her A1c.  Come back to see me sooner if needed.  Take care, Dr Jerline Pain  PLEASE NOTE:  If you had any lab tests please let us know if you have not heard back within a few days. You may see your results on mychart before we have a chance to review them but we will give you a call once they are reviewed by [place]. If we ordered any referrals today, please let us know if you have not heard from their office within the next week.   Please try these tips to maintain a healthy lifestyle:   Eat at least 3 REAL meals and 1-2 snacks per day.  Aim for no more than 5 hours between eating.  If you eat breakfast, please do so within one hour of getting up.    Each meal should contain half fruits/vegetables, one quarter protein, and one quarter carbs (no bigger than a computer mouse)   Cut down on sweet beverages. This includes juice, soda, and sweet tea.     Drink at least 1 glass of water with each meal and aim for at least 8 glasses per day   Exercise at least 150 minutes every week.    Preventive Care 28-62 Years Old, Female Preventive care refers to lifestyle choices and visits with your health care provider that can promote health and wellness. This includes:  A yearly physical exam. This is also called an annual wellness visit.  Regular dental and eye exams.  Immunizations.  Screening for certain conditions.  Healthy lifestyle choices, such as: ? Eating a healthy diet. ? Getting regular exercise. ? Not using drugs or products that contain nicotine and tobacco. ? Limiting alcohol use. What can I expect for my preventive care visit? Physical exam Your health care provider will check your:  Height and weight. These may be used to calculate your BMI (body mass index). BMI is a measurement that tells if you are at a healthy weight.  Heart rate and blood  pressure.  Body temperature.  Skin for abnormal spots. Counseling Your health care provider may ask you questions about your:  Past medical problems.  Family's medical history.  Alcohol, tobacco, and drug use.  Emotional well-being.  Home life and relationship well-being.  Sexual activity.  Diet, exercise, and sleep habits.  Work and work Statistician.  Access to firearms.  Method of birth control.  Menstrual cycle.  Pregnancy history. What immunizations do I need? Vaccines are usually given at various ages, according to a schedule. Your health care provider will recommend vaccines for you based on your age, medical history, and lifestyle or other factors, such as travel or where you work.   What tests do I need? Blood tests  Lipid and cholesterol levels. These may be checked every 5 years, or more often if you are over 56 years old.  Hepatitis C test.  Hepatitis B test. Screening  Lung cancer screening. You may have this screening every year starting at age 23 if you have a 30-pack-year history of smoking and currently smoke or have quit within the past 15 years.  Colorectal cancer screening. ? All adults should have this screening starting at age 52 and continuing until age 36. ? Your health care provider may recommend screening at age 43 if you are at increased risk. ? You will  have tests every 1-10 years, depending on your results and the type of screening test.  Diabetes screening. ? This is done by checking your blood sugar (glucose) after you have not eaten for a while (fasting). ? You may have this done every 1-3 years.  Mammogram. ? This may be done every 1-2 years. ? Talk with your health care provider about when you should start having regular mammograms. This may depend on whether you have a family history of breast cancer.  BRCA-related cancer screening. This may be done if you have a family history of breast, ovarian, tubal, or peritoneal  cancers.  Pelvic exam and Pap test. ? This may be done every 3 years starting at age 89. ? Starting at age 35, this may be done every 5 years if you have a Pap test in combination with an HPV test. Other tests  STD (sexually transmitted disease) testing, if you are at risk.  Bone density scan. This is done to screen for osteoporosis. You may have this scan if you are at high risk for osteoporosis. Talk with your health care provider about your test results, treatment options, and if necessary, the need for more tests. Follow these instructions at home: Eating and drinking  Eat a diet that includes fresh fruits and vegetables, whole grains, lean protein, and low-fat dairy products.  Take vitamin and mineral supplements as recommended by your health care provider.  Do not drink alcohol if: ? Your health care provider tells you not to drink. ? You are pregnant, may be pregnant, or are planning to become pregnant.  If you drink alcohol: ? Limit how much you have to 0-1 drink a day. ? Be aware of how much alcohol is in your drink. In the [place] one drink equals one 12 oz bottle of beer (355 mL), one 5 oz glass of wine (148 mL), or one 1 oz glass of hard liquor (44 mL).   Lifestyle  Take daily care of your teeth and gums. Brush your teeth every morning and night with fluoride toothpaste. Floss one time each day.  Stay active. Exercise for at least 30 minutes 5 or more days each week.  Do not use any products that contain nicotine or tobacco, such as cigarettes, e-cigarettes, and chewing tobacco. If you need help quitting, ask your health care provider.  Do not use drugs.  If you are sexually active, practice safe sex. Use a condom or other form of protection to prevent STIs (sexually transmitted infections).  If you do not wish to become pregnant, use a form of birth control. If you plan to become pregnant, see your health care provider for a prepregnancy visit.  If told by your  health care provider, take low-dose aspirin daily starting at age 9.  Find healthy ways to cope with stress, such as: ? Meditation, yoga, or listening to music. ? Journaling. ? Talking to a trusted person. ? Spending time with friends and family. Safety  Always wear your seat belt while driving or riding in a vehicle.  Do not drive: ? If you have been drinking alcohol. Do not ride with someone who has been drinking. ? When you are tired or distracted. ? While texting.  Wear a helmet and other protective equipment during sports activities.  If you have firearms in your house, make sure you follow all gun safety procedures. What's next?  Visit your health care provider once a year for an annual wellness visit.  Ask your health  care provider how often you should have your eyes and teeth checked.  Stay up to date on all vaccines. This information is not intended to replace advice given to you by your health care provider. Make sure you discuss any questions you have with your health care provider. Document Revised: 11/10/2019 Document Reviewed: 10/17/2017 Elsevier Patient Education  2021 [employer].

## 2020-06-16 NOTE — Progress Notes (Signed)
Chief Complaint:  Christine Best is a 45 y.o. female who presents today for her annual comprehensive physical exam.  She is a new patient.   Assessment/Plan:  Chronic Problems Addressed Today: Essential hypertension At goal on amlodipine 5 mg daily.  Will refill today.  Morbid obesity (HCC) BMI 51.  Will start Ozempic which should help with this.  Continue lifestyle modifications.  Type 2 diabetes mellitus with complication (HCC) She has not been on any medications for the last several weeks.  Her last A1c was 7.  We discussed tensional options.  She has been on metformin in the past and tolerated well though she is interested in other options.  We will start Ozempic 0.25 mg weekly for 4 to 6 weeks and then increase to 0.5 mg weekly.  She will follow-up with me in 3 months to recheck A1c.  Body mass index is 51.66 kg/m. / Obese  BMI Metric Follow Up - 06/16/20 0837      BMI Metric Follow Up-Please document annually   BMI Metric Follow Up Education provided           Preventative Healthcare: UTD on vaccines and screenings. Check labs next CPE.   Patient Counseling(The following topics were reviewed and/or handout was given):  -Nutrition: Stressed importance of moderation in sodium/caffeine intake, saturated fat and cholesterol, caloric balance, sufficient intake of fresh fruits, vegetables, and fiber.  -Stressed the importance of regular exercise.   -Substance Abuse: Discussed cessation/primary prevention of tobacco, alcohol, or other drug use; driving or other dangerous activities under the influence; availability of treatment for abuse.   -Injury prevention: Discussed safety belts, safety helmets, smoke detector, smoking near bedding or upholstery.   -Sexuality: Discussed sexually transmitted diseases, partner selection, use of condoms, avoidance of unintended pregnancy and contraceptive alternatives.   -Dental health: Discussed importance of regular tooth brushing, flossing,  and dental visits.  -Health maintenance and immunizations reviewed. Please refer to Health maintenance section.  Return to care in 1 year for next preventative visit.     Subjective:  HPI:  She has no acute complaints today. See A/p for status of chronic conditions.   Lifestyle Diet: Balanced.  Exercise: Limited.   Depression screen [facility] 2/9 10/15/2019  Decreased Interest 0  Down, Depressed, Hopeless 0  PHQ - 2 Score 0  Altered sleeping -  Tired, decreased energy -  Change in appetite -  Feeling bad or failure about yourself  -  Trouble concentrating -  Moving slowly or fidgety/restless -  Suicidal thoughts -  PHQ-9 Score -  Difficult doing work/chores -    Health Maintenance Due  Topic Date Due  . HEMOGLOBIN A1C  02/02/2020     ROS: Per HPI, otherwise a complete review of systems was negative.   PMH:  The following were reviewed and entered/updated in epic: Past Medical History:  Diagnosis Date  . Anemia   . Chronic constipation   . Delivery with history of C-section 08/03/2019  . HTN (hypertension)    followed by pcp  . Infertility, female   . Pelvic adhesions   . Type 2 diabetes mellitus (HCC)    followed by pcp;   fasting sugar-- 130   (does not check blood sugar on regular basis)  . Uterine fibroid   . Wears contact lenses    Patient Active Problem List   Diagnosis Date Noted  . Shortness of breath on exertion 09/03/2017  . Type 2 diabetes mellitus with complication ([place]) 94/85/4627  .  Morbid obesity ([place]) 04/03/2017  . Essential hypertension 04/03/2017   Past Surgical History:  Procedure Laterality Date  . ADENOIDECTOMY  age 25  . CESAREAN SECTION N/A 08/03/2019   Procedure: CESAREAN SECTION;  Surgeon: Sherlyn Hay, DO;  Location: [facility];  Service: Obstetrics;  Laterality: N/A;  Tracey RNFA  . HYSTEROSCOPY N/A 11/07/2018   Procedure: HYSTEROSCOPY, endometrial biopsy;  Surgeon: Governor Specking, MD;  Location[facility];   Service: Gynecology;  Laterality: N/A;  . ROBOT ASSISTED MYOMECTOMY N/A 03/26/2018   Procedure: XI ROBOTIC ASSISTED LAPAROSCOPIC MYOMECTOMY WITH TRANSVAGINAL ULTRASOUND GUIDANCE AND EXCISION OF ENDOMETRIOSIS;  Surgeon: Governor Specking, MD;  Location: [facility];  Service: Gynecology;  Laterality: N/A;  . ROBOT ASSISTED MYOMECTOMY  01-24-2010   dr Delsa Sale @[facility]    and [facility] HYSTEROSCOPY    Family History  Problem Relation Age of Onset  . Diabetes Mother   . Hypertension Mother   . Cancer Father   . Diabetes Brother   . Cancer Maternal Aunt        type unknown    Medications- reviewed and updated Current Outpatient Medications  Medication Sig Dispense Refill  . Continuous Blood Gluc Receiver (DEXCOM G6 RECEIVER) DEVI Use daily as needed to check blood sugar. 1 each 5  . Continuous Blood Gluc Sensor (DEXCOM G6 SENSOR) MISC Use daily as needed to check blood sugar. 3 each 5  . Continuous Blood Gluc Transmit (DEXCOM G6 TRANSMITTER) MISC Use daily as needed to check blood sugar. 1 each 5  . Semaglutide,0.25 or 0.5MG /DOS, (OZEMPIC, 0.25 OR 0.5 MG/DOSE,) 2 MG/1.5ML SOPN Inject 0.25 mg into the skin once a week. 1.5 mL 3  . amLODipine (NORVASC) 5 MG tablet TAKE 1 TABLET BY MOUTH DAILY. 90 tablet 3   No current facility-administered medications for this visit.    Allergies-reviewed and updated Allergies  Allergen Reactions  . Lisinopril Swelling and Other (See Comments)    angioedema    Social History   Socioeconomic History  . Marital status: Single    Spouse name: Not on file  . Number of children: 0  . Years of education: Not on file  . Highest education level: Not on file  Occupational History  . Occupation: Engineer, mining  Tobacco Use  . Smoking status: Never Smoker  . Smokeless tobacco: Never Used  Vaping Use  . Vaping Use: Never used  Substance and Sexual Activity  . Alcohol use: Yes    Alcohol/week: 0.0 standard drinks    Comment: occasional  .  Drug use: No  . Sexual activity: Yes    Birth control/protection: None  Other Topics Concern  . Not on file  Social History Narrative  . Not on file   Social Determinants of Health   Financial Resource Strain: Not on file  Food Insecurity: Not on file  Transportation Needs: Not on file  Physical Activity: Not on file  Stress: Not on file  Social Connections: Not on file        Objective:  Physical Exam: BP 130/88   Pulse 84   Temp (!) 97.5 F (36.4 C) (Temporal)   Ht 5\' 1"  (1.549 m)   Wt 273 lb 6.4 oz (124 kg)   LMP 06/03/2019   SpO2 97%   BMI 51.66 kg/m   Body mass index is 51.66 kg/m. Wt Readings from Last 3 Encounters:  06/16/20 273 lb 6.4 oz (124 kg)  10/15/19 256 lb 6.4 oz (116.3 kg)  08/03/19 296  lb (134.3 kg)   Gen: NAD, resting comfortably HEENT: TMs normal bilaterally. OP clear. No thyromegaly noted.  CV: RRR with no murmurs appreciated Pulm: NWOB, CTAB with no crackles, wheezes, or rhonchi GI: Normal bowel sounds present. Soft, Nontender, Nondistended. MSK: no edema, cyanosis, or clubbing noted Skin: warm, dry Neuro: CN2-12 grossly intact. Strength 5/5 in upper and lower extremities. Reflexes symmetric and intact bilaterally.  Psych: Normal affect and thought content     Luisantonio Adinolfi M. Jerline Pain, MD 06/16/2020 8:37 AM

## 2020-06-16 NOTE — Assessment & Plan Note (Signed)
She has not been on any medications for the last several weeks.  Her last A1c was 7.  We discussed tensional options.  She has been on metformin in the past and tolerated well though she is interested in other options.  We will start Ozempic 0.25 mg weekly for 4 to 6 weeks and then increase to 0.5 mg weekly.  She will follow-up with me in 3 months to recheck A1c.

## 2020-06-16 NOTE — Assessment & Plan Note (Signed)
At goal on amlodipine 5 mg daily.  Will refill today. ?

## 2020-06-17 ENCOUNTER — Other Ambulatory Visit (HOSPITAL_COMMUNITY): Payer: Self-pay

## 2020-06-17 ENCOUNTER — Telehealth: Payer: Self-pay | Admitting: *Deleted

## 2020-06-17 NOTE — Telephone Encounter (Signed)
PA for dexom G6 receiver, transmitter and sensor send today Waiting for determination

## 2020-06-20 ENCOUNTER — Other Ambulatory Visit (HOSPITAL_COMMUNITY): Payer: Self-pay

## 2020-06-20 NOTE — Telephone Encounter (Signed)
Dexcom G6 Transmitter, The request has been approved. The authorization is effective for a maximum of 4 fills from 06/20/2020 to 06/19/2021, as long as the member is enrolled in their current health plan. The request was reviewed and approved by a licensed clinical pharmacist. This request has been approved for 1 transmitter per 90 days. A written notification letter will follow with additional details.  Dexcom G6 Sensor Approvedtoday The request has been approved. The authorization is effective for a maximum of 12 fills from 06/20/2020 to 06/19/2021, as long as the member is enrolled in their current health plan. The request was reviewed and approved by a licensed clinical pharmacist. This request has been approved for 3 sensors per 30 days. A written notification letter will follow with additional details.  Dexcom G6 Receiver deviceDeniedtoday The request has been partially denied. The authorization is effective for a maximum of 1 fills from 06/20/2020 to 06/19/2021, as long as the member is enrolled in their current health plan. We were asked to cover a larger amount of the drug listed at the top of this letter than allowed under our quantity limit rule. We denied this request based on our Pharmacy Benefit Formulary Exception Process because your provider requested a quantity of one (1) Dexcom G6 receiver per 90 days for the management of type 2 diabetes mellitus (a chronic condition in which your body is resistant to insulin, a hormone that regulates your blood sugar), but our guideline named CONTINUOUS GLUCOSE MONITORS (which includes Dexcom G6) allows for approval of a maximum of one (1) receiver per 365 days. This is why you were approved for a lower quantity than requested based on our Pharmacy Benefit Formulary Exception Process. Please note that a prior authorization has been approved for 12 months with a quantity limit of one (1) receiver per 365 days. In order for this request to be approved at the  quantity requested, your provider needs to give [place] information showing that the larger amount is clinically necessary for your care, including a statement that the lower quantity did not or would not work for you and medical literature supporting the requested quantity. A written notification letter will follow with additional details.  Pharmacy notified  Patient notified

## 2020-07-05 ENCOUNTER — Other Ambulatory Visit: Payer: Self-pay | Admitting: *Deleted

## 2020-07-21 ENCOUNTER — Other Ambulatory Visit (HOSPITAL_COMMUNITY): Payer: Self-pay

## 2020-07-22 DIAGNOSIS — Z794 Long term (current) use of insulin: Secondary | ICD-10-CM | POA: Diagnosis not present

## 2020-07-22 DIAGNOSIS — E1165 Type 2 diabetes mellitus with hyperglycemia: Secondary | ICD-10-CM | POA: Diagnosis not present

## 2020-08-09 ENCOUNTER — Other Ambulatory Visit (HOSPITAL_COMMUNITY): Payer: Self-pay

## 2020-08-26 ENCOUNTER — Other Ambulatory Visit (HOSPITAL_COMMUNITY): Payer: Self-pay

## 2020-09-09 ENCOUNTER — Other Ambulatory Visit (HOSPITAL_COMMUNITY): Payer: Self-pay

## 2020-09-20 ENCOUNTER — Ambulatory Visit: Payer: 59 | Admitting: Family Medicine

## 2020-09-20 ENCOUNTER — Other Ambulatory Visit: Payer: Self-pay | Admitting: Family Medicine

## 2020-09-20 ENCOUNTER — Other Ambulatory Visit (HOSPITAL_COMMUNITY): Payer: Self-pay

## 2020-09-20 ENCOUNTER — Other Ambulatory Visit: Payer: Self-pay

## 2020-09-20 ENCOUNTER — Encounter: Payer: Self-pay | Admitting: Family Medicine

## 2020-09-20 VITALS — BP 126/85 | HR 99 | Ht 61.0 in

## 2020-09-20 DIAGNOSIS — E118 Type 2 diabetes mellitus with unspecified complications: Secondary | ICD-10-CM | POA: Diagnosis not present

## 2020-09-20 DIAGNOSIS — I10 Essential (primary) hypertension: Secondary | ICD-10-CM | POA: Diagnosis not present

## 2020-09-20 DIAGNOSIS — Z1231 Encounter for screening mammogram for malignant neoplasm of breast: Secondary | ICD-10-CM

## 2020-09-20 LAB — POCT GLYCOSYLATED HEMOGLOBIN (HGB A1C): Hemoglobin A1C: 6.3 % — AB (ref 4.0–5.6)

## 2020-09-20 MED ORDER — SEMAGLUTIDE (1 MG/DOSE) 4 MG/3ML ~~LOC~~ SOPN
1.0000 mg | PEN_INJECTOR | SUBCUTANEOUS | 3 refills | Status: DC
Start: 2020-09-20 — End: 2021-01-10
  Filled 2020-09-20: qty 3, 28d supply, fill #0
  Filled 2020-10-18: qty 3, 28d supply, fill #1
  Filled 2020-11-08: qty 3, 28d supply, fill #2
  Filled 2020-12-13: qty 3, 28d supply, fill #3

## 2020-09-20 NOTE — Assessment & Plan Note (Addendum)
A1c improved to 6.3.  She is also having weight loss with Ozempic.  Will increase dose of Ozempic to 1 mg weekly.  She will follow-up in 3 months.  We can recheck A1c at that time.

## 2020-09-20 NOTE — Assessment & Plan Note (Signed)
At goal on amlodipine 5 mg daily. 

## 2020-09-20 NOTE — Progress Notes (Signed)
   Christine Best is a 45 y.o. female who presents today for an office visit.  Assessment/Plan:  Chronic Problems Addressed Today: Essential hypertension At goal on amlodipine 5 mg daily.  Type 2 diabetes mellitus with complication (HCC)   123456 improved to 6.3.  She is also having weight loss with Ozempic.  Will increase dose of Ozempic to 1 mg weekly.  She will follow-up in 3 months.  We can recheck A1c at that time.     Subjective:  HPI: She have no acute complaint today. Her A1c was 6.3 . She has been on Ozempic in the past and tolerating her medication well. She is interested in increasing her dose to 1 mg. No side effects with the medication. She lost few pounds since her last visit. Denies fever, chills, nausea, fatigue or pain.        Objective:  Physical Exam: BP 126/85   Pulse 99   Ht '5\' 1"'$  (1.549 m)   LMP 09/12/2020   SpO2 98%   BMI 51.66 kg/m   Gen: No acute distress, resting comfortably Neuro: Grossly normal, moves all extremities Psych: Normal affect and thought content       I,Savera Zaman,acting as a scribe for Dimas Chyle, MD.,have documented all relevant documentation on the behalf of Dimas Chyle, MD,as directed by  Dimas Chyle, MD while in the presence of Dimas Chyle, MD.   I, Dimas Chyle, MD, have reviewed all documentation for this visit. The documentation on 09/20/20 for the exam, diagnosis, procedures, and orders are all accurate and complete.  Algis Greenhouse. Jerline Pain, MD 09/20/2020 9:26 AM

## 2020-09-20 NOTE — Patient Instructions (Signed)
It was very nice to see you today!  Your A1c looks better.  We will increase your Ozempic to 1 mg weekly.  Please follow-up with me in about 3 months.  Please come back to see me sooner if needed.  Take care, Dr Jerline Pain  PLEASE NOTE:  If you had any lab tests please let us know if you have not heard back within a few days. You may see your results on mychart before we have a chance to review them but we will give you a call once they are reviewed by [place]. If we ordered any referrals today, please let us know if you have not heard from their office within the next week.   Please try these tips to maintain a healthy lifestyle:  Eat at least 3 REAL meals and 1-2 snacks per day.  Aim for no more than 5 hours between eating.  If you eat breakfast, please do so within one hour of getting up.   Each meal should contain half fruits/vegetables, one quarter protein, and one quarter carbs (no bigger than a computer mouse)  Cut down on sweet beverages. This includes juice, soda, and sweet tea.   Drink at least 1 glass of water with each meal and aim for at least 8 glasses per day  Exercise at least 150 minutes every week.

## 2020-10-13 DIAGNOSIS — Z794 Long term (current) use of insulin: Secondary | ICD-10-CM | POA: Diagnosis not present

## 2020-10-13 DIAGNOSIS — E1165 Type 2 diabetes mellitus with hyperglycemia: Secondary | ICD-10-CM | POA: Diagnosis not present

## 2020-10-18 ENCOUNTER — Other Ambulatory Visit (HOSPITAL_COMMUNITY): Payer: Self-pay

## 2020-11-04 ENCOUNTER — Other Ambulatory Visit (HOSPITAL_COMMUNITY): Payer: Self-pay

## 2020-11-04 DIAGNOSIS — D259 Leiomyoma of uterus, unspecified: Secondary | ICD-10-CM | POA: Diagnosis not present

## 2020-11-04 DIAGNOSIS — N920 Excessive and frequent menstruation with regular cycle: Secondary | ICD-10-CM | POA: Diagnosis not present

## 2020-11-04 MED ORDER — MYFEMBREE 40-1-0.5 MG PO TABS
ORAL_TABLET | ORAL | 4 refills | Status: DC
  Filled 2020-11-21: qty 84, 84d supply, fill #0

## 2020-11-09 ENCOUNTER — Other Ambulatory Visit (HOSPITAL_COMMUNITY): Payer: Self-pay

## 2020-11-09 ENCOUNTER — Ambulatory Visit: Payer: 59

## 2020-11-21 ENCOUNTER — Other Ambulatory Visit (HOSPITAL_COMMUNITY): Payer: Self-pay

## 2020-11-21 ENCOUNTER — Ambulatory Visit
Admission: RE | Admit: 2020-11-21 | Discharge: 2020-11-21 | Disposition: A | Discharge status: Home / Self Care | Payer: 59 | Source: Ambulatory Visit | Attending: Family Medicine | Admitting: Family Medicine

## 2020-11-21 ENCOUNTER — Other Ambulatory Visit: Payer: Self-pay

## 2020-11-21 DIAGNOSIS — Z1231 Encounter for screening mammogram for malignant neoplasm of breast: Secondary | ICD-10-CM | POA: Diagnosis not present

## 2020-11-22 ENCOUNTER — Other Ambulatory Visit (HOSPITAL_COMMUNITY): Payer: Self-pay

## 2020-11-22 ENCOUNTER — Ambulatory Visit: Payer: 59

## 2020-11-23 ENCOUNTER — Other Ambulatory Visit (HOSPITAL_COMMUNITY): Payer: Self-pay

## 2020-12-01 ENCOUNTER — Other Ambulatory Visit (HOSPITAL_COMMUNITY): Payer: Self-pay

## 2020-12-13 ENCOUNTER — Other Ambulatory Visit (HOSPITAL_COMMUNITY): Payer: Self-pay

## 2021-01-03 DIAGNOSIS — E1165 Type 2 diabetes mellitus with hyperglycemia: Secondary | ICD-10-CM | POA: Diagnosis not present

## 2021-01-03 DIAGNOSIS — Z794 Long term (current) use of insulin: Secondary | ICD-10-CM | POA: Diagnosis not present

## 2021-01-10 ENCOUNTER — Other Ambulatory Visit: Payer: Self-pay | Admitting: Family Medicine

## 2021-01-10 ENCOUNTER — Other Ambulatory Visit (HOSPITAL_COMMUNITY): Payer: Self-pay

## 2021-01-10 MED ORDER — OZEMPIC (1 MG/DOSE) 4 MG/3ML ~~LOC~~ SOPN
1.0000 mg | PEN_INJECTOR | SUBCUTANEOUS | 3 refills | Status: DC
  Filled 2021-01-10: qty 3, 28d supply, fill #0

## 2021-02-02 ENCOUNTER — Other Ambulatory Visit (HOSPITAL_COMMUNITY): Payer: Self-pay

## 2021-02-02 ENCOUNTER — Encounter: Payer: Self-pay | Admitting: Family Medicine

## 2021-02-02 ENCOUNTER — Other Ambulatory Visit: Payer: Self-pay

## 2021-02-02 ENCOUNTER — Ambulatory Visit: Payer: 59 | Admitting: Family Medicine

## 2021-02-02 VITALS — BP 124/82 | HR 81 | Temp 97.3°F | Ht 61.0 in | Wt 260.8 lb

## 2021-02-02 DIAGNOSIS — E1159 Type 2 diabetes mellitus with other circulatory complications: Secondary | ICD-10-CM

## 2021-02-02 DIAGNOSIS — E118 Type 2 diabetes mellitus with unspecified complications: Secondary | ICD-10-CM

## 2021-02-02 DIAGNOSIS — I152 Hypertension secondary to endocrine disorders: Secondary | ICD-10-CM | POA: Diagnosis not present

## 2021-02-02 LAB — POCT GLYCOSYLATED HEMOGLOBIN (HGB A1C): Hemoglobin A1C: 6.1 % — AB (ref 4.0–5.6)

## 2021-02-02 MED ORDER — SEMAGLUTIDE (2 MG/DOSE) 8 MG/3ML ~~LOC~~ SOPN
2.0000 mg | PEN_INJECTOR | SUBCUTANEOUS | 1 refills | Status: DC
Start: 2021-02-02 — End: 2021-07-02
  Filled 2021-02-02: qty 3, 28d supply, fill #0
  Filled 2021-03-01: qty 3, 28d supply, fill #1
  Filled 2021-03-23: qty 3, 28d supply, fill #2
  Filled 2021-04-19: qty 3, 28d supply, fill #3
  Filled 2021-05-12: qty 3, 28d supply, fill #4
  Filled 2021-06-05: qty 3, 28d supply, fill #5

## 2021-02-02 NOTE — Patient Instructions (Signed)
It was very nice to see you today!  Your A1c looks great.  We can increase your Ozempic to 2 mg weekly.  Please let me know if you have any side effects at the higher dose.  Will take amlodipine off your medication list.  I will see you back in 6 months for your annual checkup with labs.  Please come back to see me sooner if needed.  Take care, Dr Jerline Pain  PLEASE NOTE:  If you had any lab tests please let us know if you have not heard back within a few days. You may see your results on mychart before we have a chance to review them but we will give you a call once they are reviewed by [place]. If we ordered any referrals today, please let us know if you have not heard from their office within the next week.   Please try these tips to maintain a healthy lifestyle:  Eat at least 3 REAL meals and 1-2 snacks per day.  Aim for no more than 5 hours between eating.  If you eat breakfast, please do so within one hour of getting up.   Each meal should contain half fruits/vegetables, one quarter protein, and one quarter carbs (no bigger than a computer mouse)  Cut down on sweet beverages. This includes juice, soda, and sweet tea.   Drink at least 1 glass of water with each meal and aim for at least 8 glasses per day  Exercise at least 150 minutes every week.

## 2021-02-02 NOTE — Progress Notes (Signed)
° °  Christine Best is a 45 y.o. female who presents today for an office visit.  Assessment/Plan:  Chronic Problems Addressed Today: Hypertension associated with diabetes ([place]) At goal off medications.  Morbid obesity ([place]) She is down about 13 pounds since starting Ozempic.  Will increase dose to 2 mg weekly.  Discussed potential side effects.  Follow-up in 3 to 6 months.  Type 2 diabetes mellitus with complication (HCC) P7T well controlled at 6.1.  Will increase dose of Ozempic as above.  Discussed potential side effects.  She will come back in 6 months for CPE and we can recheck A1c at that time.    Subjective:  HPI:  She is here to recheck her A1c. She is currently on Ozempic 1 mg weekly. She is tolerating her medication well without any side effects. Her A1c was stable at 6.1. She has noticed some weight loss since being on Ozempic. She is interested to increased her dose of Ozempic to 2 mg weekly. She has no acute complaints today.        Objective:  Physical Exam: BP 124/82 (BP Location: Right Arm)    Pulse 81    Temp (!) 97.3 F (36.3 C) (Temporal)    Ht 5\' 1"  (1.549 m)    Wt 260 lb 12.8 oz (118.3 kg)    LMP 01/19/2021 (Exact Date)    SpO2 99%    BMI 49.28 kg/m   Gen: No acute distress, resting comfortably CV: Regular rate and rhythm with no murmurs appreciated Pulm: Normal work of breathing, clear to auscultation bilaterally with no crackles, wheezes, or rhonchi Neuro: Grossly normal, moves all extremities Psych: Normal affect and thought content       I,Savera Zaman,acting as a scribe for Dimas Chyle, MD.,have documented all relevant documentation on the behalf of Dimas Chyle, MD,as directed by  Dimas Chyle, MD while in the presence of Dimas Chyle, MD.   I, Dimas Chyle, MD, have reviewed all documentation for this visit. The documentation on 02/02/21 for the exam, diagnosis, procedures, and orders are all accurate and complete.  Algis Greenhouse. Jerline Pain, MD 02/02/2021  10:34 AM

## 2021-02-02 NOTE — Assessment & Plan Note (Signed)
At goal off medications. 

## 2021-02-02 NOTE — Assessment & Plan Note (Signed)
A1c well controlled at 6.1.  Will increase dose of Ozempic as above.  Discussed potential side effects.  She will come back in 6 months for CPE and we can recheck A1c at that time.

## 2021-02-02 NOTE — Assessment & Plan Note (Signed)
She is down about 13 pounds since starting Ozempic.  Will increase dose to 2 mg weekly.  Discussed potential side effects.  Follow-up in 3 to 6 months.

## 2021-03-01 ENCOUNTER — Other Ambulatory Visit (HOSPITAL_COMMUNITY): Payer: Self-pay

## 2021-03-23 ENCOUNTER — Other Ambulatory Visit (HOSPITAL_COMMUNITY): Payer: Self-pay

## 2021-03-24 DIAGNOSIS — E1165 Type 2 diabetes mellitus with hyperglycemia: Secondary | ICD-10-CM | POA: Diagnosis not present

## 2021-03-24 DIAGNOSIS — Z794 Long term (current) use of insulin: Secondary | ICD-10-CM | POA: Diagnosis not present

## 2021-04-19 ENCOUNTER — Other Ambulatory Visit (HOSPITAL_COMMUNITY): Payer: Self-pay

## 2021-04-19 DIAGNOSIS — E288 Other ovarian dysfunction: Secondary | ICD-10-CM | POA: Diagnosis not present

## 2021-04-19 DIAGNOSIS — D251 Intramural leiomyoma of uterus: Secondary | ICD-10-CM | POA: Diagnosis not present

## 2021-04-19 DIAGNOSIS — Z3183 Encounter for assisted reproductive fertility procedure cycle: Secondary | ICD-10-CM | POA: Diagnosis not present

## 2021-04-19 DIAGNOSIS — N979 Female infertility, unspecified: Secondary | ICD-10-CM | POA: Diagnosis not present

## 2021-04-19 DIAGNOSIS — Z113 Encounter for screening for infections with a predominantly sexual mode of transmission: Secondary | ICD-10-CM | POA: Diagnosis not present

## 2021-04-19 MED ORDER — FOLIC ACID 1 MG PO TABS
ORAL_TABLET | ORAL | 3 refills | Status: DC
  Filled 2021-04-19: qty 120, 30d supply, fill #0
  Filled 2021-04-19 – 2021-12-18 (×2): qty 120, 30d supply, fill #1

## 2021-04-20 ENCOUNTER — Other Ambulatory Visit (HOSPITAL_COMMUNITY): Payer: Self-pay

## 2021-05-10 ENCOUNTER — Other Ambulatory Visit (HOSPITAL_COMMUNITY): Payer: Self-pay

## 2021-05-10 MED ORDER — MYFEMBREE 40-1-0.5 MG PO TABS
ORAL_TABLET | ORAL | 4 refills | Status: DC
  Filled 2021-05-10: qty 28, 28d supply, fill #0

## 2021-05-12 ENCOUNTER — Other Ambulatory Visit (HOSPITAL_COMMUNITY): Payer: Self-pay

## 2021-06-05 ENCOUNTER — Other Ambulatory Visit (HOSPITAL_COMMUNITY): Payer: Self-pay

## 2021-06-12 ENCOUNTER — Telehealth: Payer: Self-pay | Admitting: *Deleted

## 2021-06-12 NOTE — Telephone Encounter (Signed)
PA [id number] ?Status ?Sent to Plan today ?Drug ?Dexcom G6 Transmitter ?Waiting for determination  ?

## 2021-06-14 NOTE — Telephone Encounter (Signed)
Key: [id number] - PA Case ID: [id number] ?Outcome ?Deniedon April 25 ?This request has not been approved ?Patient notified  ?

## 2021-06-22 DIAGNOSIS — E1165 Type 2 diabetes mellitus with hyperglycemia: Secondary | ICD-10-CM | POA: Diagnosis not present

## 2021-06-22 DIAGNOSIS — Z794 Long term (current) use of insulin: Secondary | ICD-10-CM | POA: Diagnosis not present

## 2021-07-02 ENCOUNTER — Other Ambulatory Visit: Payer: Self-pay | Admitting: Family Medicine

## 2021-07-03 ENCOUNTER — Other Ambulatory Visit (HOSPITAL_COMMUNITY): Payer: Self-pay

## 2021-07-03 MED ORDER — OZEMPIC (2 MG/DOSE) 8 MG/3ML ~~LOC~~ SOPN
2.0000 mg | PEN_INJECTOR | SUBCUTANEOUS | 1 refills | Status: DC
  Filled 2021-07-03: qty 9, 84d supply, fill #0
  Filled 2021-09-12: qty 9, 84d supply, fill #1
  Filled 2021-09-18: qty 3, 28d supply, fill #1
  Filled 2021-10-19: qty 3, 28d supply, fill #2
  Filled 2021-11-14: qty 3, 28d supply, fill #3

## 2021-07-05 ENCOUNTER — Ambulatory Visit: Payer: 59 | Admitting: Family Medicine

## 2021-07-05 ENCOUNTER — Encounter: Payer: Self-pay | Admitting: Family Medicine

## 2021-07-05 VITALS — BP 133/85 | HR 81 | Temp 98.1°F | Ht 61.0 in | Wt 252.2 lb

## 2021-07-05 DIAGNOSIS — Z13220 Encounter for screening for lipoid disorders: Secondary | ICD-10-CM

## 2021-07-05 DIAGNOSIS — I152 Hypertension secondary to endocrine disorders: Secondary | ICD-10-CM

## 2021-07-05 DIAGNOSIS — E1159 Type 2 diabetes mellitus with other circulatory complications: Secondary | ICD-10-CM | POA: Diagnosis not present

## 2021-07-05 DIAGNOSIS — E118 Type 2 diabetes mellitus with unspecified complications: Secondary | ICD-10-CM

## 2021-07-05 DIAGNOSIS — Z0001 Encounter for general adult medical examination with abnormal findings: Secondary | ICD-10-CM | POA: Diagnosis not present

## 2021-07-05 LAB — COMPREHENSIVE METABOLIC PANEL
ALT: 8 U/L (ref 0–35)
AST: 11 U/L (ref 0–37)
Albumin: 4.1 g/dL (ref 3.5–5.2)
Alkaline Phosphatase: 86 U/L (ref 39–117)
BUN: 7 mg/dL (ref 6–23)
CO2: 25 mEq/L (ref 19–32)
Calcium: 9 mg/dL (ref 8.4–10.5)
Chloride: 105 mEq/L (ref 96–112)
Creatinine, Ser: 0.8 mg/dL (ref 0.40–1.20)
GFR: 90.08 mL/min (ref >60.00)
Glucose, Bld: 90 mg/dL (ref 70–99)
Potassium: 3.7 mEq/L (ref 3.5–5.1)
Sodium: 138 mEq/L (ref 135–145)
Total Bilirubin: 0.5 mg/dL (ref 0.2–1.2)
Total Protein: 7.6 g/dL (ref 6.0–8.3)

## 2021-07-05 LAB — HEMOGLOBIN A1C: Hgb A1c MFr Bld: 6.4 % (ref 4.6–6.5)

## 2021-07-05 LAB — MICROALBUMIN / CREATININE URINE RATIO
Creatinine,U: 262.6 mg/dL
Microalb Creat Ratio: 0.6 mg/g (ref 0.0–30.0)
Microalb, Ur: 1.5 mg/dL (ref 0.0–1.9)

## 2021-07-05 LAB — CBC
HCT: 28.6 % — ABNORMAL LOW (ref 36.0–46.0)
Hemoglobin: 8.2 g/dL — ABNORMAL LOW (ref 12.0–15.0)
MCHC: 28.8 g/dL — ABNORMAL LOW (ref 30.0–36.0)
MCV: 63.3 fl — ABNORMAL LOW (ref 78.0–100.0)
Platelets: 481 10*3/uL — ABNORMAL HIGH (ref 150.0–400.0)
RBC: 4.52 Mil/uL (ref 3.87–5.11)
RDW: 19.5 % — ABNORMAL HIGH (ref 11.5–15.5)
WBC: 5.1 10*3/uL (ref 4.0–10.5)

## 2021-07-05 LAB — LIPID PANEL
Cholesterol: 148 mg/dL (ref 0–200)
HDL: 43.7 mg/dL (ref >39.00)
LDL Cholesterol: 90 mg/dL (ref 0–99)
NonHDL: 104.07
Total CHOL/HDL Ratio: 3
Triglycerides: 72 mg/dL (ref 0.0–149.0)
VLDL: 14.4 mg/dL (ref 0.0–40.0)

## 2021-07-05 LAB — TSH: TSH: 0.74 u[IU]/mL (ref 0.35–5.50)

## 2021-07-05 NOTE — Progress Notes (Signed)
? ?Chief Complaint:  ?Christine Best is a 46 y.o. female who presents today for her annual comprehensive physical exam.   ? ?Assessment/Plan:  ?Chronic Problems Addressed Today: ?Hypertension associated with diabetes ([place]) ?At goal today off meds. ? ?Morbid obesity ([place]) ?Down about 8 pounds over the last 6 months.  Congratulated patient on weight loss.  We will continue Ozempic 2 mg weekly. ? ?Type 2 diabetes mellitus with complication ([place]) ?She is doing well with Ozempic.  Limited constipation but this is manageable.  We will check A1c today and continue Ozempic 2 mg weekly.  May consider switching to [facility] if she is not meeting her A1c goals. ? ?Preventative Healthcare: ?Check labs.  Foot exam performed today.  Up-to-date on vaccines.  Pap was done last year. ? ?Patient Counseling(The following topics were reviewed and/or handout was given): ? -Nutrition: Stressed importance of moderation in sodium/caffeine intake, saturated fat and cholesterol, caloric balance, sufficient intake of fresh fruits, vegetables, and fiber. ? -Stressed the importance of regular exercise.  ? -Substance Abuse: Discussed cessation/primary prevention of tobacco, alcohol, or other drug use; driving or other dangerous activities under the influence; availability of treatment for abuse.  ? -Injury prevention: Discussed safety belts, safety helmets, smoke detector, smoking near bedding or upholstery.  ? -Sexuality: Discussed sexually transmitted diseases, partner selection, use of condoms, avoidance of unintended pregnancy and contraceptive alternatives.  ? -Dental health: Discussed importance of regular tooth brushing, flossing, and dental visits. ? -Health maintenance and immunizations reviewed. Please refer to Health maintenance section. ? ?Return to care in 1 year for next preventative visit.  ? ?  ?Subjective:  ?HPI: ? ?She has no acute complaints today.  See A/P for status chronic conditions. ? ? ? ?  09/20/2020  ?  9:03 AM   ?Depression screen PHQ 2/9  ?Decreased Interest 0  ?Down, Depressed, Hopeless 0  ?PHQ - 2 Score 0  ? ? ?Health Maintenance Due  ?Topic Date Due  ? URINE MICROALBUMIN  10/14/2020  ?  ? ?ROS: Per HPI, otherwise a complete review of systems was negative.  ? ?PMH: ? ?The following were reviewed and entered/updated in epic: ?Past Medical History:  ?Diagnosis Date  ? Anemia   ? Chronic constipation   ? Delivery with history of C-section 08/03/2019  ? HTN (hypertension)   ? followed by pcp  ? Infertility, female   ? Pelvic adhesions   ? Type 2 diabetes mellitus ([place])   ? followed by pcp;   fasting sugar-- 130   (does not check blood sugar on regular basis)  ? Uterine fibroid   ? Wears contact lenses   ? ?Patient Active Problem List  ? Diagnosis Date Noted  ? Shortness of breath on exertion 09/03/2017  ? Type 2 diabetes mellitus with complication ([place]) 16/11/9602  ? Morbid obesity ([place]) 04/03/2017  ? Hypertension associated with diabetes ([place]) 04/03/2017  ? ?Past Surgical History:  ?Procedure Laterality Date  ? ADENOIDECTOMY  age 24  ? CESAREAN SECTION N/A 08/03/2019  ? Procedure: CESAREAN SECTION;  Surgeon: Sherlyn Hay, DO;  Location: [facility];  Service: Obstetrics;  Laterality: N/A;  Tracey RNFA  ? HYSTEROSCOPY N/A 11/07/2018  ? Procedure: HYSTEROSCOPY, endometrial biopsy;  Surgeon: Governor Specking, MD;  Location: [facility];  Service: Gynecology;  Laterality: N/A;  ? ROBOT ASSISTED MYOMECTOMY N/A 03/26/2018  ? Procedure: XI ROBOTIC ASSISTED LAPAROSCOPIC MYOMECTOMY WITH TRANSVAGINAL ULTRASOUND GUIDANCE AND EXCISION OF ENDOMETRIOSIS;  Surgeon: Governor Specking, MD;  Location: [facility] [place];  Service: Gynecology;  Laterality: N/A;  ? ROBOT ASSISTED MYOMECTOMY  01-24-2010   dr Delsa Sale '@[facility]'$   ? and [facility] HYSTEROSCOPY  ? ? ?Family History  ?Problem Relation Age of Onset  ? Diabetes Mother   ? Hypertension Mother   ? Cancer Father   ? Diabetes Brother   ? Cancer Maternal Aunt   ?      type unknown  ? ? ?Medications- reviewed and updated ?Current Outpatient Medications  ?Medication Sig Dispense Refill  ? Continuous Blood Gluc Receiver (DEXCOM G6 RECEIVER) DEVI Use daily as needed to check blood sugar. 1 each 5  ? Continuous Blood Gluc Sensor (DEXCOM G6 SENSOR) MISC Use daily as needed to check blood sugar. 3 each 5  ? Semaglutide, 2 MG/DOSE, (OZEMPIC, 2 MG/DOSE,) 8 MG/3ML SOPN Inject 2 mg as directed once a week. 9 mL 1  ? folic acid (FOLVITE) 1 MG tablet Take 4 tablets by mouth daily (Patient not taking: Reported on 07/05/2021) 120 tablet 3  ? ?No current facility-administered medications for this visit.  ? ? ?Allergies-reviewed and updated ?Allergies  ?Allergen Reactions  ? Lisinopril Swelling and Other (See Comments)  ?  angioedema  ? ? ?Social History  ? ?Socioeconomic History  ? Marital status: Single  ?  Spouse name: Not on file  ? Number of children: 0  ? Years of education: Not on file  ? Highest education level: Not on file  ?Occupational History  ? Occupation: Engineer, mining  ?Tobacco Use  ? Smoking status: Never  ? Smokeless tobacco: Never  ?Vaping Use  ? Vaping Use: Never used  ?Substance and Sexual Activity  ? Alcohol use: Yes  ?  Alcohol/week: 0.0 standard drinks  ?  Comment: occasional  ? Drug use: No  ? Sexual activity: Yes  ?  Birth control/protection: None  ?Other Topics Concern  ? Not on file  ?Social History Narrative  ? Not on file  ? ?Social Determinants of Health  ? ?Financial Resource Strain: Not on file  ?Food Insecurity: Not on file  ?Transportation Needs: Not on file  ?Physical Activity: Not on file  ?Stress: Not on file  ?Social Connections: Not on file  ? ?   ?  ?Objective:  ?Physical Exam: ?BP 133/85 (BP Location: Left Arm)   Pulse 81   Temp 98.1 ?F (36.7 ?C) (Temporal)   Ht '5\' 1"'$  (1.549 m)   Wt 252 lb 3.2 oz (114.4 kg)   LMP 06/28/2021 (Exact Date)   SpO2 98%   Breastfeeding Unknown   BMI 47.65 kg/m?   ?Body mass index is 47.65 kg/m?. ?Wt Readings from Last  3 Encounters:  ?07/05/21 252 lb 3.2 oz (114.4 kg)  ?02/02/21 260 lb 12.8 oz (118.3 kg)  ?06/16/20 273 lb 6.4 oz (124 kg)  ? ?Gen: NAD, resting comfortably ?HEENT: TMs normal bilaterally. OP clear. No thyromegaly noted.  ?CV: RRR with no murmurs appreciated ?Pulm: NWOB, CTAB with no crackles, wheezes, or rhonchi ?GI: Normal bowel sounds present. Soft, Nontender, Nondistended. ?MSK: no edema, cyanosis, or clubbing noted ?Skin: warm, dry ?Neuro: CN2-12 grossly intact. Strength 5/5 in upper and lower extremities. Reflexes symmetric and intact bilaterally.  ?Psych: Normal affect and thought content ?   ? ?Algis Greenhouse. Jerline Pain, MD ?07/05/2021 9:51 AM  ?

## 2021-07-05 NOTE — Assessment & Plan Note (Signed)
Down about 8 pounds over the last 6 months.  Congratulated patient on weight loss.  We will continue Ozempic 2 mg weekly. ?

## 2021-07-05 NOTE — Assessment & Plan Note (Signed)
She is doing well with Ozempic.  Limited constipation but this is manageable.  We will check A1c today and continue Ozempic 2 mg weekly.  May consider switching to [facility] if she is not meeting her A1c goals. ?

## 2021-07-05 NOTE — Patient Instructions (Signed)
It was very nice to see you today! ? ?We will check blood work today. ? ?We will continue Ozempic.  I will see back in 6 months.  Please come back to see me sooner if needed. ? ?Take care, ?Dr Jerline Pain ? ?PLEASE NOTE: ? ?If you had any lab tests please let us know if you have not heard back within a few days. You may see your results on mychart before we have a chance to review them but we will give you a call once they are reviewed by [place]. If we ordered any referrals today, please let us know if you have not heard from their office within the next week.  ? ?Please try these tips to maintain a healthy lifestyle: ? ?Eat at least 3 REAL meals and 1-2 snacks per day.  Aim for no more than 5 hours between eating.  If you eat breakfast, please do so within one hour of getting up.  ? ?Each meal should contain half fruits/vegetables, one quarter protein, and one quarter carbs (no bigger than a computer mouse) ? ?Cut down on sweet beverages. This includes juice, soda, and sweet tea.  ? ?Drink at least 1 glass of water with each meal and aim for at least 8 glasses per day ? ?Exercise at least 150 minutes every week.   ? ?Preventive Care 44-65 Years Old, Female ?Preventive care refers to lifestyle choices and visits with your health care provider that can promote health and wellness. Preventive care visits are also called wellness exams. ?What can I expect for my preventive care visit? ?Counseling ?Your health care provider may ask you questions about your: ?Medical history, including: ?Past medical problems. ?Family medical history. ?Pregnancy history. ?Current health, including: ?Menstrual cycle. ?Method of birth control. ?Emotional well-being. ?Home life and relationship well-being. ?Sexual activity and sexual health. ?Lifestyle, including: ?Alcohol, nicotine or tobacco, and drug use. ?Access to firearms. ?Diet, exercise, and sleep habits. ?Work and work Statistician. ?Sunscreen use. ?Safety issues such as seatbelt and bike  helmet use. ?Physical exam ?Your health care provider will check your: ?Height and weight. These may be used to calculate your BMI (body mass index). BMI is a measurement that tells if you are at a healthy weight. ?Waist circumference. This measures the distance around your waistline. This measurement also tells if you are at a healthy weight and may help predict your risk of certain diseases, such as type 2 diabetes and high blood pressure. ?Heart rate and blood pressure. ?Body temperature. ?Skin for abnormal spots. ?What immunizations do I need? ? ?Vaccines are usually given at various ages, according to a schedule. Your health care provider will recommend vaccines for you based on your age, medical history, and lifestyle or other factors, such as travel or where you work. ?What tests do I need? ?Screening ?Your health care provider may recommend screening tests for certain conditions. This may include: ?Lipid and cholesterol levels. ?Diabetes screening. This is done by checking your blood sugar (glucose) after you have not eaten for a while (fasting). ?Pelvic exam and Pap test. ?Hepatitis B test. ?Hepatitis C test. ?HIV (human immunodeficiency virus) test. ?STI (sexually transmitted infection) testing, if you are at risk. ?Lung cancer screening. ?Colorectal cancer screening. ?Mammogram. Talk with your health care provider about when you should start having regular mammograms. This may depend on whether you have a family history of breast cancer. ?BRCA-related cancer screening. This may be done if you have a family history of breast, ovarian, tubal, or peritoneal  cancers. ?Bone density scan. This is done to screen for osteoporosis. ?Talk with your health care provider about your test results, treatment options, and if necessary, the need for more tests. ?Follow these instructions at home: ?Eating and drinking ? ?Eat a diet that includes fresh fruits and vegetables, whole grains, lean protein, and low-fat dairy  products. ?Take vitamin and mineral supplements as recommended by your health care provider. ?Do not drink alcohol if: ?Your health care provider tells you not to drink. ?You are pregnant, may be pregnant, or are planning to become pregnant. ?If you drink alcohol: ?Limit how much you have to 0-1 drink a day. ?Know how much alcohol is in your drink. In the [place] one drink equals one 12 oz bottle of beer (355 mL), one 5 oz glass of wine (148 mL), or one 1? oz glass of hard liquor (44 mL). ?Lifestyle ?Brush your teeth every morning and night with fluoride toothpaste. Floss one time each day. ?Exercise for at least 30 minutes 5 or more days each week. ?Do not use any products that contain nicotine or tobacco. These products include cigarettes, chewing tobacco, and vaping devices, such as e-cigarettes. If you need help quitting, ask your health care provider. ?Do not use drugs. ?If you are sexually active, practice safe sex. Use a condom or other form of protection to prevent STIs. ?If you do not wish to become pregnant, use a form of birth control. If you plan to become pregnant, see your health care provider for a prepregnancy visit. ?Take aspirin only as told by your health care provider. Make sure that you understand how much to take and what form to take. Work with your health care provider to find out whether it is safe and beneficial for you to take aspirin daily. ?Find healthy ways to manage stress, such as: ?Meditation, yoga, or listening to music. ?Journaling. ?Talking to a trusted person. ?Spending time with friends and family. ?Minimize exposure to UV radiation to reduce your risk of skin cancer. ?Safety ?Always wear your seat belt while driving or riding in a vehicle. ?Do not drive: ?If you have been drinking alcohol. Do not ride with someone who has been drinking. ?When you are tired or distracted. ?While texting. ?If you have been using any mind-altering substances or drugs. ?Wear a helmet and other  protective equipment during sports activities. ?If you have firearms in your house, make sure you follow all gun safety procedures. ?Seek help if you have been physically or sexually abused. ?What's next? ?Visit your health care provider once a year for an annual wellness visit. ?Ask your health care provider how often you should have your eyes and teeth checked. ?Stay up to date on all vaccines. ?This information is not intended to replace advice given to you by your health care provider. Make sure you discuss any questions you have with your health care provider. ?Document Revised: 08/03/2020 Document Reviewed: 08/03/2020 ?Elsevier Patient Education ? [place]. ? ?

## 2021-07-05 NOTE — Assessment & Plan Note (Signed)
At goal today off meds. 

## 2021-07-07 NOTE — Progress Notes (Signed)
Please inform patient of the following:  Her labs are all stable.  Do not need to make any changes at this point.  She should continue her current treatment plan.  She is continue to work on diet and exercise and we can recheck her A1c in 6 months.

## 2021-09-12 ENCOUNTER — Other Ambulatory Visit (HOSPITAL_COMMUNITY): Payer: Self-pay

## 2021-09-19 ENCOUNTER — Telehealth: Payer: Self-pay | Admitting: Family Medicine

## 2021-09-19 ENCOUNTER — Other Ambulatory Visit (HOSPITAL_COMMUNITY): Payer: Self-pay

## 2021-09-19 NOTE — Telephone Encounter (Signed)
Bland Span with [facility] states RX and chart notes are needed for Dexcom Continuous Glucose Meter and Sensors to be faxed to [facility] at Fax# [phone].

## 2021-09-20 ENCOUNTER — Other Ambulatory Visit: Payer: Self-pay | Admitting: *Deleted

## 2021-09-20 MED ORDER — DEXCOM G6 SENSOR MISC: 5 refills | Status: DC

## 2021-09-20 MED ORDER — DEXCOM G6 RECEIVER DEVI: 5 refills | Status: AC

## 2021-09-20 NOTE — Telephone Encounter (Signed)
Record and Rx Faxed to # [phone]

## 2021-09-22 ENCOUNTER — Other Ambulatory Visit (HOSPITAL_COMMUNITY): Payer: Self-pay

## 2021-09-22 DIAGNOSIS — E1165 Type 2 diabetes mellitus with hyperglycemia: Secondary | ICD-10-CM | POA: Diagnosis not present

## 2021-09-22 DIAGNOSIS — Z794 Long term (current) use of insulin: Secondary | ICD-10-CM | POA: Diagnosis not present

## 2021-10-03 ENCOUNTER — Encounter: Payer: Self-pay | Admitting: Emergency Medicine

## 2021-10-03 ENCOUNTER — Other Ambulatory Visit (HOSPITAL_COMMUNITY): Payer: Self-pay

## 2021-10-03 ENCOUNTER — Ambulatory Visit
Admission: EM | Admit: 2021-10-03 | Discharge: 2021-10-03 | Disposition: A | Discharge status: Home / Self Care | Payer: 59 | Attending: Urgent Care | Admitting: Urgent Care

## 2021-10-03 DIAGNOSIS — J020 Streptococcal pharyngitis: Secondary | ICD-10-CM | POA: Diagnosis not present

## 2021-10-03 DIAGNOSIS — R052 Subacute cough: Secondary | ICD-10-CM | POA: Insufficient documentation

## 2021-10-03 DIAGNOSIS — Z20822 Contact with and (suspected) exposure to covid-19: Secondary | ICD-10-CM | POA: Insufficient documentation

## 2021-10-03 LAB — POCT RAPID STREP A (OFFICE): Rapid Strep A Screen: POSITIVE — AB

## 2021-10-03 MED ORDER — NAPROXEN 375 MG PO TABS
375.0000 mg | ORAL_TABLET | Freq: Two times a day (BID) | ORAL | 0 refills | Status: DC
  Filled 2021-10-03 – 2021-12-18 (×2): qty 30, 15d supply, fill #0

## 2021-10-03 MED ORDER — AMOXICILLIN 500 MG PO CAPS
500.0000 mg | ORAL_CAPSULE | Freq: Two times a day (BID) | ORAL | 0 refills | Status: DC
  Filled 2021-10-03: qty 20, 10d supply, fill #0

## 2021-10-03 NOTE — ED Provider Notes (Signed)
[place]   MRN: [id number] DOB: [birth year]  Subjective:   Christine Best is a 46 y.o. female presenting for 3-day history of cute onset fever, fatigue, throat pain, painful swallowing, coughing.  Multiple sick contacts at home.  Nobody has been seen except for the patient.  She is a type II diabetic treated without insulin.  No history of asthma.  Patient is non-smoker.  She is not breast-feeding.  No current facility-administered medications for this encounter.  Current Outpatient Medications:    Continuous Blood Gluc Receiver ([place]) DEVI, Use daily as needed to check blood sugar., Disp: 1 each, Rfl: 5   Continuous Blood Gluc Sensor (DEXCOM G6 SENSOR) MISC, Use daily as needed to check blood sugar., Disp: 3 each, Rfl: 5   folic acid (FOLVITE) 1 MG tablet, Take 4 tablets by mouth daily (Patient not taking: Reported on 07/05/2021), Disp: 120 tablet, Rfl: 3   Semaglutide, 2 MG/DOSE, (OZEMPIC, 2 MG/DOSE,) 8 MG/3ML SOPN, Inject 2 mg as directed once a week., Disp: 9 mL, Rfl: 1   Allergies  Allergen Reactions   Lisinopril Swelling and Other (See Comments)    angioedema    Past Medical History:  Diagnosis Date   Anemia    Chronic constipation    Delivery with history of C-section 08/03/2019   HTN (hypertension)    followed by pcp   Infertility, female    Pelvic adhesions    Type 2 diabetes mellitus ([place])    followed by pcp;   fasting sugar-- 130   (does not check blood sugar on regular basis)   Uterine fibroid    Wears contact lenses      Past Surgical History:  Procedure Laterality Date   ADENOIDECTOMY  age 55   CESAREAN SECTION N/A 08/03/2019   Procedure: CESAREAN SECTION;  Surgeon: Sherlyn Hay, DO;  Location: [facility] LD ORS;  Service: Obstetrics;  Laterality: N/A;  Tracey RNFA   HYSTEROSCOPY N/A 11/07/2018   Procedure: HYSTEROSCOPY, endometrial biopsy;  Surgeon: Governor Specking, MD;  Location: [facility];  Service:  Gynecology;  Laterality: N/A;   ROBOT ASSISTED MYOMECTOMY N/A 03/26/2018   Procedure: XI ROBOTIC ASSISTED LAPAROSCOPIC MYOMECTOMY WITH TRANSVAGINAL ULTRASOUND GUIDANCE AND EXCISION OF ENDOMETRIOSIS;  Surgeon: Governor Specking, MD;  Location: [facility];  Service: Gynecology;  Laterality: N/A;   ROBOT ASSISTED MYOMECTOMY  01-24-2010   dr Delsa Sale '@[facility]'$    and [facility] HYSTEROSCOPY    Family History  Problem Relation Age of Onset   Diabetes Mother    Hypertension Mother    Cancer Father    Diabetes Brother    Cancer Maternal Aunt        type unknown    Social History   Tobacco Use   Smoking status: Never   Smokeless tobacco: Never  Vaping Use   Vaping Use: Never used  Substance Use Topics   Alcohol use: Yes    Alcohol/week: 0.0 standard drinks of alcohol    Comment: occasional   Drug use: No    ROS   Objective:   Vitals: BP (!) 149/93   Pulse 90   Temp 99 F (37.2 C)   Resp 18   SpO2 98%   Physical Exam Constitutional:      General: She is not in acute distress.    Appearance: Normal appearance. She is well-developed and normal weight. She is not ill-appearing, toxic-appearing or diaphoretic.  HENT:     Head: Normocephalic and  atraumatic.     Right Ear: Tympanic membrane, ear canal and external ear normal. No drainage or tenderness. No middle ear effusion. There is no impacted cerumen. Tympanic membrane is not erythematous.     Left Ear: Tympanic membrane, ear canal and external ear normal. No drainage or tenderness.  No middle ear effusion. There is no impacted cerumen. Tympanic membrane is not erythematous.     Nose: Nose normal. No congestion or rhinorrhea.     Mouth/Throat:     Mouth: Mucous membranes are moist. No oral lesions.     Pharynx: Pharyngeal swelling, oropharyngeal exudate and posterior oropharyngeal erythema present. No uvula swelling.     Tonsils: Tonsillar exudate present. No tonsillar abscesses. 1+ on the right. 1+ on the left.   Eyes:     General: No scleral icterus.       Right eye: No discharge.        Left eye: No discharge.     Extraocular Movements: Extraocular movements intact.     Right eye: Normal extraocular motion.     Left eye: Normal extraocular motion.     Conjunctiva/sclera: Conjunctivae normal.  Cardiovascular:     Rate and Rhythm: Normal rate and regular rhythm.     Heart sounds: Normal heart sounds. No murmur heard.    No friction rub. No gallop.  Pulmonary:     Effort: Pulmonary effort is normal. No respiratory distress.     Breath sounds: No stridor. No wheezing, rhonchi or rales.  Chest:     Chest wall: No tenderness.  Musculoskeletal:     Cervical back: Normal range of motion and neck supple.  Lymphadenopathy:     Cervical: No cervical adenopathy.  Skin:    General: Skin is warm and dry.  Neurological:     General: No focal deficit present.     Mental Status: She is alert and oriented to person, place, and time.  Psychiatric:        Mood and Affect: Mood normal.        Behavior: Behavior normal.     Results for orders placed or performed during the hospital encounter of 10/03/21 (from the past 24 hour(s))  POCT rapid strep A     Status: Abnormal   Collection Time: 10/03/21 12:43 PM  Result Value Ref Range   Rapid Strep A Screen Positive (A) Negative    Assessment and Plan :   PDMP not reviewed this encounter.  1. Strep pharyngitis   2. Subacute cough    Will treat for strep pharyngitis.  Patient is to start amoxicillin, use supportive care otherwise. COVID testing pending. Deferred imaging given clear cardiopulmonary exam, hemodynamically stable vital signs. Counseled patient on potential for adverse effects with medications prescribed/recommended today, ER and return-to-clinic precautions discussed, patient verbalized understanding.    Jaynee Eagles, PA-C 10/03/21 1259

## 2021-10-03 NOTE — ED Triage Notes (Signed)
Pt here with cough, sore throat, fever and fatigue x 3 days.

## 2021-10-04 ENCOUNTER — Ambulatory Visit: Payer: 59 | Admitting: Physician Assistant

## 2021-10-04 LAB — SARS CORONAVIRUS 2 (TAT 6-24 HRS): SARS Coronavirus 2: NEGATIVE

## 2021-10-05 ENCOUNTER — Ambulatory Visit: Payer: 59 | Admitting: Family Medicine

## 2021-10-16 ENCOUNTER — Encounter: Payer: Self-pay | Admitting: Family Medicine

## 2021-10-16 NOTE — Telephone Encounter (Signed)
See note

## 2021-10-17 NOTE — Telephone Encounter (Signed)
Ok to place referral.  Algis Greenhouse. Jerline Pain, MD 10/17/2021 7:32 AM

## 2021-10-19 ENCOUNTER — Other Ambulatory Visit (HOSPITAL_COMMUNITY): Payer: Self-pay

## 2021-10-20 ENCOUNTER — Other Ambulatory Visit: Payer: Self-pay | Admitting: *Deleted

## 2021-10-20 DIAGNOSIS — E118 Type 2 diabetes mellitus with unspecified complications: Secondary | ICD-10-CM

## 2021-10-20 NOTE — Telephone Encounter (Signed)
Referral placed.

## 2021-11-13 ENCOUNTER — Encounter: Payer: Self-pay | Admitting: *Deleted

## 2021-11-14 ENCOUNTER — Other Ambulatory Visit: Payer: Self-pay | Admitting: Family Medicine

## 2021-11-14 ENCOUNTER — Other Ambulatory Visit (HOSPITAL_COMMUNITY): Payer: Self-pay

## 2021-11-14 DIAGNOSIS — Z1231 Encounter for screening mammogram for malignant neoplasm of breast: Secondary | ICD-10-CM

## 2021-12-11 ENCOUNTER — Other Ambulatory Visit: Payer: Self-pay | Admitting: Family Medicine

## 2021-12-12 ENCOUNTER — Ambulatory Visit
Admission: RE | Admit: 2021-12-12 | Discharge: 2021-12-12 | Disposition: A | Discharge status: Home / Self Care | Payer: 59 | Source: Ambulatory Visit | Attending: Family Medicine | Admitting: Family Medicine

## 2021-12-12 ENCOUNTER — Other Ambulatory Visit (HOSPITAL_COMMUNITY): Payer: Self-pay

## 2021-12-12 DIAGNOSIS — E1165 Type 2 diabetes mellitus with hyperglycemia: Secondary | ICD-10-CM | POA: Diagnosis not present

## 2021-12-12 DIAGNOSIS — Z1231 Encounter for screening mammogram for malignant neoplasm of breast: Secondary | ICD-10-CM

## 2021-12-12 DIAGNOSIS — Z794 Long term (current) use of insulin: Secondary | ICD-10-CM | POA: Diagnosis not present

## 2021-12-12 MED ORDER — OZEMPIC (2 MG/DOSE) 8 MG/3ML ~~LOC~~ SOPN
2.0000 mg | PEN_INJECTOR | SUBCUTANEOUS | 1 refills | Status: DC
  Filled 2021-12-12: qty 3, 28d supply, fill #0
  Filled 2021-12-18 – 2022-01-10 (×2): qty 3, 28d supply, fill #1
  Filled 2022-02-06: qty 3, 28d supply, fill #2
  Filled 2022-03-07: qty 3, 28d supply, fill #3
  Filled 2022-03-30: qty 3, 28d supply, fill #4
  Filled 2022-04-02 – 2022-04-24 (×2): qty 3, 28d supply, fill #5

## 2021-12-13 ENCOUNTER — Other Ambulatory Visit (HOSPITAL_COMMUNITY): Payer: Self-pay

## 2021-12-18 ENCOUNTER — Other Ambulatory Visit (HOSPITAL_COMMUNITY): Payer: Self-pay

## 2021-12-19 ENCOUNTER — Other Ambulatory Visit (HOSPITAL_COMMUNITY): Payer: Self-pay

## 2021-12-26 ENCOUNTER — Other Ambulatory Visit (HOSPITAL_COMMUNITY): Payer: Self-pay

## 2021-12-26 MED ORDER — AZITHROMYCIN 250 MG PO TABS
ORAL_TABLET | ORAL | 0 refills | Status: AC
Start: 2021-12-26 — End: 2021-12-31
  Filled 2021-12-26: qty 6, 5d supply, fill #0

## 2022-01-10 ENCOUNTER — Other Ambulatory Visit (HOSPITAL_COMMUNITY): Payer: Self-pay

## 2022-01-13 ENCOUNTER — Other Ambulatory Visit (HOSPITAL_COMMUNITY): Payer: Self-pay

## 2022-01-15 ENCOUNTER — Other Ambulatory Visit (HOSPITAL_COMMUNITY): Payer: Self-pay

## 2022-02-01 ENCOUNTER — Encounter: Payer: Self-pay | Admitting: *Deleted

## 2022-02-07 ENCOUNTER — Other Ambulatory Visit (HOSPITAL_COMMUNITY): Payer: Self-pay

## 2022-03-02 ENCOUNTER — Other Ambulatory Visit (HOSPITAL_COMMUNITY): Payer: Self-pay

## 2022-03-02 ENCOUNTER — Other Ambulatory Visit: Payer: Self-pay

## 2022-03-02 ENCOUNTER — Ambulatory Visit (INDEPENDENT_AMBULATORY_CARE_PROVIDER_SITE_OTHER): Payer: Commercial Managed Care - PPO | Admitting: Family Medicine

## 2022-03-02 ENCOUNTER — Encounter: Payer: Self-pay | Admitting: Family Medicine

## 2022-03-02 VITALS — BP 145/92 | HR 86 | Temp 97.3°F | Ht 61.0 in | Wt 244.4 lb

## 2022-03-02 DIAGNOSIS — E118 Type 2 diabetes mellitus with unspecified complications: Secondary | ICD-10-CM

## 2022-03-02 DIAGNOSIS — I152 Hypertension secondary to endocrine disorders: Secondary | ICD-10-CM

## 2022-03-02 DIAGNOSIS — E1159 Type 2 diabetes mellitus with other circulatory complications: Secondary | ICD-10-CM

## 2022-03-02 DIAGNOSIS — N946 Dysmenorrhea, unspecified: Secondary | ICD-10-CM

## 2022-03-02 DIAGNOSIS — K429 Umbilical hernia without obstruction or gangrene: Secondary | ICD-10-CM | POA: Insufficient documentation

## 2022-03-02 LAB — POCT GLYCOSYLATED HEMOGLOBIN (HGB A1C): Hemoglobin A1C: 6.5 % — AB (ref 4.0–5.6)

## 2022-03-02 MED ORDER — NORGESTIM-ETH ESTRAD TRIPHASIC 0.18/0.215/0.25 MG-25 MCG PO TABS
1.0000 | ORAL_TABLET | Freq: Every day | ORAL | 11 refills | Status: DC
  Filled 2022-03-02: qty 28, 28d supply, fill #0
  Filled 2022-03-25 – 2022-03-30 (×3): qty 28, 28d supply, fill #1
  Filled 2022-04-24: qty 28, 28d supply, fill #2
  Filled 2022-05-18: qty 28, 28d supply, fill #3
  Filled 2022-06-19: qty 28, 28d supply, fill #4
  Filled 2022-07-10 – 2022-07-13 (×2): qty 28, 28d supply, fill #5
  Filled 2022-08-09: qty 28, 28d supply, fill #6
  Filled 2022-10-01: qty 28, 28d supply, fill #0
  Filled 2022-10-29 (×2): qty 28, 28d supply, fill #1
  Filled 2022-11-21: qty 28, 28d supply, fill #2
  Filled 2022-12-13: qty 28, 28d supply, fill #3

## 2022-03-02 NOTE — Assessment & Plan Note (Signed)
Mildly elevated today.  She is well well-controlled over the last few office visits.  She will continue to monitor at home and let us know if persistently elevated.

## 2022-03-02 NOTE — Assessment & Plan Note (Signed)
No red flags.  Will place referral to surgery for further management.

## 2022-03-02 NOTE — Assessment & Plan Note (Addendum)
A1c 6.5.  Doing well with Ozempic '2mg'$  weekly  We did discuss switching to [facility] however would like to continue with current regimen for now.  Will recheck again in 6 months for CPE.  Will refer for diabetic eye exam.

## 2022-03-02 NOTE — Progress Notes (Signed)
MADGE THERRIEN is a 47 y.o. female who presents today for an office visit.  Assessment/Plan:  Chronic Problems Addressed Today: Type 2 diabetes mellitus with complication (HCC) T6R 6.5.  Doing well with Ozempic '2mg'$  weekly  We did discuss switching to [facility] however would like to continue with current regimen for now.  Will recheck again in 6 months for CPE.  Will refer for diabetic eye exam.  Dysmenorrhea Secondary to uterine fibroids.  She has been on depo Provera with last injection about 3 months ago and has had continued spotting since then.  She has not had sexual intercourse for the last several years and has no chance of being pregnant.  She does have an upcoming appointment with OB/GYN however would like to try to OCP to help control the bleeding before she can get into see them.  We will send in Ortho Tri-Cyclen.  She has previously done well with this in the past.  No history of migraines otherwise.  No smoking history.  Will defer further management to OB/GYN.  Umbilical hernia No red flags.  Will place referral to surgery for further management.  Hypertension associated with diabetes ([place]) Mildly elevated today.  She is well well-controlled over the last few office visits.  She will continue to monitor at home and let us know if persistently elevated.     Subjective:  HPI:  See A/P for status chronic conditions.  Patient is here today for follow-up.  She was last seen here about 9 months ago.  Was doing well at that time.  A1c 6.4.  She was continued on Ozempic 2 mg weekly.  She has had some issues with getting this at the pharmacy due to stock issues however has been tolerating reasonably well.  Does have small amount of nausea which is manageable.  Since her last visit she has had ongoing issues with dysfunctional uterine bleeding.  She has been following with gynecology for this.  She has previously been diagnosed with fibroids as well.  They tried Myfembree which did  not work.  She was then switched to Depo-Provera.  This caused excessive spotting when she is dealing with currently.  She does have an appointment coming up in several weeks however would like for [place] to start an OCP to help manage her dysfunctional uterine bleeding.  She has not had sexual intercourse in several years and has no chance of being pregnant.  Last Depo-Provera injection was given in October.  Saw has an umbilical hernia.  This just recently popped up within the last couple of months.  Minimal pain.  Worse with certain activities.  She also requests referral to see ophthalmology for eye exam.        Objective:  Physical Exam: BP (!) 145/92   Pulse 86   Temp (!) 97.3 F (36.3 C) (Temporal)   Ht '5\' 1"'$  (1.549 m)   Wt 244 lb 6.4 oz (110.9 kg)   LMP  (LMP Unknown)   SpO2 100%   BMI 46.18 kg/m   Wt Readings from Last 3 Encounters:  03/02/22 244 lb 6.4 oz (110.9 kg)  07/05/21 252 lb 3.2 oz (114.4 kg)  02/02/21 260 lb 12.8 oz (118.3 kg)  Gen: No acute distress, resting comfortably CV: Regular rate and rhythm with no murmurs appreciated Pulm: Normal work of breathing, clear to auscultation bilaterally with no crackles, wheezes, or rhonchi GI: Umbilical hernia noted.  Soft, nondistended. Neuro: Grossly normal, moves all extremities Psych: Normal affect and  thought [place]. Jerline Pain, MD 03/02/2022 9:08 AM

## 2022-03-02 NOTE — Patient Instructions (Signed)
It was very nice to see you today!  We will send in a birth control pill to help with your bleeding.  Please let us know if you need any other assistance with this.  I will refer you to see a surgeon for your hernia.  I will refer you to see a different eye doctor.  Your A1c today is 6.5.  Will continue your current dose of Ozempic.  We will see back in 6 months for your annual physical and we can recheck at that time.  Take care, Dr Jerline Pain  PLEASE NOTE:  If you had any lab tests, please let us know if you have not heard back within a few days. You may see your results on mychart before we have a chance to review them but we will give you a call once they are reviewed by [place].   If we ordered any referrals today, please let us know if you have not heard from their office within the next week.   If you had any urgent prescriptions sent in today, please check with the pharmacy within an hour of our visit to make sure the prescription was transmitted appropriately.   Please try these tips to maintain a healthy lifestyle:  Eat at least 3 REAL meals and 1-2 snacks per day.  Aim for no more than 5 hours between eating.  If you eat breakfast, please do so within one hour of getting up.   Each meal should contain half fruits/vegetables, one quarter protein, and one quarter carbs (no bigger than a computer mouse)  Cut down on sweet beverages. This includes juice, soda, and sweet tea.   Drink at least 1 glass of water with each meal and aim for at least 8 glasses per day  Exercise at least 150 minutes every week.

## 2022-03-02 NOTE — Assessment & Plan Note (Signed)
Secondary to uterine fibroids.  She has been on depo Provera with last injection about 3 months ago and has had continued spotting since then.  She has not had sexual intercourse for the last several years and has no chance of being pregnant.  She does have an upcoming appointment with OB/GYN however would like to try to OCP to help control the bleeding before she can get into see them.  We will send in Ortho Tri-Cyclen.  She has previously done well with this in the past.  No history of migraines otherwise.  No smoking history.  Will defer further management to OB/GYN.

## 2022-03-06 DIAGNOSIS — E1165 Type 2 diabetes mellitus with hyperglycemia: Secondary | ICD-10-CM | POA: Diagnosis not present

## 2022-03-06 DIAGNOSIS — Z794 Long term (current) use of insulin: Secondary | ICD-10-CM | POA: Diagnosis not present

## 2022-03-07 ENCOUNTER — Other Ambulatory Visit (HOSPITAL_COMMUNITY): Payer: Self-pay

## 2022-03-15 ENCOUNTER — Ambulatory Visit: Payer: Self-pay | Admitting: Surgery

## 2022-03-15 DIAGNOSIS — K429 Umbilical hernia without obstruction or gangrene: Secondary | ICD-10-CM | POA: Diagnosis not present

## 2022-03-15 NOTE — H&P (Signed)
Christine Best   Referring Provider:  Vivi Barrack, MD   Subjective   Chief Complaint: New Consultation and Umbilical Hernia     History of Present Illness:    Very pleasant 47 year old woman with history of anemia, chronic constipation, hypertension, pelvic adhesions, type 2 diabetes (on Ozempic), uterine fibroids, dysmenorrhea and previous abdominal surgery of robot-assisted myomectomy/ endometriosis excision (February 2020, Dr. Kerin Perna), C-section (June 2021) who presents for evaluation of an umbilical hernia.  She noticed this during her pregnancy.  Previously was asymptomatic but in recent months has become minimally painful/uncomfortable, aggravated by certain activities.  Associated GI symptoms although she does note more discomfort when she is constipated.  Denies tobacco use.  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   Review of Systems: A complete review of systems was obtained from the patient.  I have reviewed this information and discussed as appropriate with the patient.  See HPI as well for other ROS.   Medical History: Past Medical History:  Diagnosis Date   Diabetes mellitus, type 2 (CMS-HCC)    Hypertension     Patient Active Problem List  Diagnosis   Pre-existing type 2 diabetes mellitus during pregnancy, antepartum    Past Surgical History:  Procedure Laterality Date   ADENOIDECTOMY     LAPAROSCOPIC MYOMECTOMY       Allergies  Allergen Reactions   Lisinopril Other (See Comments) and Swelling    angioedema    Current Outpatient Medications on File Prior to Visit  Medication Sig Dispense Refill   aspirin 81 MG chewable tablet Take by mouth     FREESTYLE LIBRE 14 DAY SENSOR kit      metFORMIN (GLUCOPHAGE-XR) 750 MG XR tablet Take by mouth     OMNIPOD DASH 5 PACK POD Crtg      prenatal vit-iron fum-folic ac (PRENAVITE) tablet Take by mouth     No current  facility-administered medications on file prior to visit.    Family History  Problem Relation Age of Onset   Obesity Mother    Diabetes Mother    Diabetes Father    Obesity Sister    Diabetes Brother      Social History   Tobacco Use  Smoking Status Never  Smokeless Tobacco Never     Social History   Socioeconomic History   Marital status: Single  Tobacco Use   Smoking status: Never   Smokeless tobacco: Never    Objective:    Vitals:   03/15/22 0836  BP: (!) 145/90  Pulse: 98  Temp: 36.7 C (98 F)  SpO2: 98%  Weight: (!) 114.3 kg (252 lb)  Height: 157.5 cm ('5\' 2"'$ )    Body mass index is 46.09 kg/m.  Gen: A&Ox3, no distress  Unlabored respiration Abdomen soft, nontender, nondistended; partially reducible umbilical hernia which is focally tender.  Fascial defect is not definitively palpable habitus  Assessment and Plan:  Diagnoses and all orders for this visit:  Umbilical hernia without obstruction and without gangrene    Discussed the relevant anatomy and options for repair.  This appears to be a small primary umbilical hernia; would recommend open repair possibly with mesh depending on fascial defect size at time of surgery.  We discussed the procedure including technical details, risks of bleeding, infection, pain, scarring, injury to intra-abdominal structures, seroma/hematoma, wound healing issues or direct cosmetic outcome, as well as risk of hernia recurrence.  Her risk of recurrence is  increased secondary to obesity.  Given increasing pain from the hernia however, I think repair is warranted at this point.  Questions welcomed and answered to her satisfaction, we will schedule at her convenience.  Stefano Trulson Raquel James, MD

## 2022-03-16 DIAGNOSIS — E119 Type 2 diabetes mellitus without complications: Secondary | ICD-10-CM | POA: Diagnosis not present

## 2022-03-16 DIAGNOSIS — H35033 Hypertensive retinopathy, bilateral: Secondary | ICD-10-CM | POA: Diagnosis not present

## 2022-03-16 LAB — HM DIABETES EYE EXAM

## 2022-03-26 ENCOUNTER — Other Ambulatory Visit (HOSPITAL_COMMUNITY): Payer: Self-pay

## 2022-03-30 ENCOUNTER — Other Ambulatory Visit (HOSPITAL_COMMUNITY): Payer: Self-pay

## 2022-03-31 ENCOUNTER — Other Ambulatory Visit (HOSPITAL_COMMUNITY): Payer: Self-pay

## 2022-04-02 ENCOUNTER — Other Ambulatory Visit: Payer: Self-pay

## 2022-04-18 ENCOUNTER — Ambulatory Visit: Payer: Commercial Managed Care - PPO | Admitting: Family

## 2022-04-18 ENCOUNTER — Encounter: Payer: Self-pay | Admitting: Family

## 2022-04-18 VITALS — BP 129/85 | HR 85 | Temp 97.6°F | Ht 61.0 in | Wt 250.2 lb

## 2022-04-18 DIAGNOSIS — J069 Acute upper respiratory infection, unspecified: Secondary | ICD-10-CM

## 2022-04-18 LAB — POC COVID19 BINAXNOW: SARS Coronavirus 2 Ag: NEGATIVE

## 2022-04-18 LAB — POCT INFLUENZA A/B
Influenza A, POC: NEGATIVE
Influenza B, POC: NEGATIVE

## 2022-04-18 LAB — POCT RAPID STREP A (OFFICE): Rapid Strep A Screen: NEGATIVE

## 2022-04-18 NOTE — Progress Notes (Signed)
Patient ID: Christine Best, female    DOB: [birth year], 47 y.o.   MRN: [id number]  Chief Complaint  Patient presents with   Sinus Problem    Pt c/o sore throat, runny nose, cough and congestion for about 3 days. Has tried nyquil/dayqil which did not help. Pt states she has had a flu shot. No covid test.     HPI:      URI sx:  Pt c/o sore throat, runny nose, cough and congestion for about 3 days. Has tried nyquil/dayqil which did not help. Pt states she has had a flu shot. No covid test.   Assessment & Plan:  1. Viral upper respiratory tract infection - all rapid testing neg. Continue over the counter sinus/cold medications including generic Sudafed, Claritin D, or Mucinex per box directions, Ibuprofen up to '600mg'$  or generic Tylenol 1,'000mg'$  every 6 hours. Use over the counter saline nasal spray 3 times per day to moisturize & disinfect. OK to take Zinc up to '25mg'$  & up to 2,'000mg'$  Vitamin C daily while having symptoms, then reduce doses to 3-4 days per week, or 1/2 dose daily to boost immunity. Increase water intake to at least 2 liters daily.   - POC COVID-19 - POCT rapid strep A - POCT Influenza A/B   Subjective:    Outpatient Medications Prior to Visit  Medication Sig Dispense Refill   Continuous Blood Gluc Receiver (DEXCOM G6 RECEIVER) DEVI Use daily as needed to check blood sugar. 1 each 5   Continuous Blood Gluc Sensor (DEXCOM G6 SENSOR) MISC Use daily as needed to check blood sugar. 3 each 5   Norgestimate-Ethinyl Estradiol Triphasic (ORTHO TRI-CYCLEN LO) 0.18/0.215/0.25 MG-25 MCG tab Take 1 tablet by mouth daily. 28 tablet 11   Semaglutide, 2 MG/DOSE, (OZEMPIC, 2 MG/DOSE,) 8 MG/3ML SOPN Inject 2 mg as directed once a week. 9 mL 1   No facility-administered medications prior to visit.   Past Medical History:  Diagnosis Date   Anemia    Chronic constipation    Delivery with history of C-section 08/03/2019   HTN (hypertension)    followed by pcp   Infertility, female     Pelvic adhesions    Type 2 diabetes mellitus ([place])    followed by pcp;   fasting sugar-- 130   (does not check blood sugar on regular basis)   Uterine fibroid    Wears contact lenses    Past Surgical History:  Procedure Laterality Date   ADENOIDECTOMY  age 36   CESAREAN SECTION N/A 08/03/2019   Procedure: CESAREAN SECTION;  Surgeon: Sherlyn Hay, DO;  Location: [facility];  Service: Obstetrics;  Laterality: N/A;  Tracey RNFA   HYSTEROSCOPY N/A 11/07/2018   Procedure: HYSTEROSCOPY, endometrial biopsy;  Surgeon: Governor Specking, MD;  Location: [facility];  Service: Gynecology;  Laterality: N/A;   ROBOT ASSISTED MYOMECTOMY N/A 03/26/2018   Procedure: XI ROBOTIC ASSISTED LAPAROSCOPIC MYOMECTOMY WITH TRANSVAGINAL ULTRASOUND GUIDANCE AND EXCISION OF ENDOMETRIOSIS;  Surgeon: Governor Specking, MD;  Location: [facility];  Service: Gynecology;  Laterality: N/A;   ROBOT ASSISTED MYOMECTOMY  01-24-2010   dr Delsa Sale '@[facility]'$    and [facility] HYSTEROSCOPY   Allergies  Allergen Reactions   Lisinopril Swelling and Other (See Comments)    angioedema      Objective:    Physical Exam Vitals and nursing note reviewed.  Constitutional:      Appearance: Normal appearance. She is ill-appearing.     Interventions: Face mask  in place.  HENT:     Right Ear: Tympanic membrane and ear canal normal.     Left Ear: Tympanic membrane and ear canal normal.     Nose:     Right Sinus: No frontal sinus tenderness.     Left Sinus: No frontal sinus tenderness.     Mouth/Throat:     Mouth: Mucous membranes are moist.     Pharynx: Posterior oropharyngeal erythema present. No pharyngeal swelling, oropharyngeal exudate or uvula swelling.     Tonsils: No tonsillar exudate or tonsillar abscesses.  Cardiovascular:     Rate and Rhythm: Normal rate and regular rhythm.  Pulmonary:     Effort: Pulmonary effort is normal.     Breath sounds: Normal breath sounds.  Musculoskeletal:         General: Normal range of motion.  Lymphadenopathy:     Head:     Right side of head: No preauricular or posterior auricular adenopathy.     Left side of head: No preauricular or posterior auricular adenopathy.     Cervical: No cervical adenopathy.  Skin:    General: Skin is warm and dry.  Neurological:     Mental Status: She is alert.  Psychiatric:        Mood and Affect: Mood normal.        Behavior: Behavior normal.    BP 129/85 (BP Location: Left Arm, Patient Position: Sitting, Cuff Size: Large)   Pulse 85   Temp 97.6 F (36.4 C) (Temporal)   Ht '5\' 1"'$  (1.549 m)   Wt 250 lb 4 oz (113.5 kg)   SpO2 98%   BMI 47.28 kg/m  Wt Readings from Last 3 Encounters:  04/18/22 250 lb 4 oz (113.5 kg)  03/02/22 244 lb 6.4 oz (110.9 kg)  07/05/21 252 lb 3.2 oz (114.4 kg)       Jeanie Sewer, NP

## 2022-04-21 NOTE — Patient Instructions (Signed)
SURGICAL WAITING ROOM VISITATION Patients having surgery or a procedure may have no more than 2 support people in the waiting area - these visitors may rotate in the visitor waiting room.   Due to an increase in RSV and influenza rates and associated hospitalizations, children ages 68 and under may not visit patients in [place]. If the patient needs to stay at the hospital during part of their recovery, the visitor guidelines for inpatient rooms apply.  PRE-OP VISITATION  Pre-op nurse will coordinate an appropriate time for 1 support person to accompany the patient in pre-op.  This support person may not rotate.  This visitor will be contacted when the time is appropriate for the visitor to come back in the pre-op area.  Please refer to the [facility] website for the visitor guidelines for Inpatients (after your surgery is over and you are in a regular room).  You are not required to quarantine at this time prior to your surgery. However, you must do this: Hand Hygiene often Do NOT share personal items Notify your provider if you are in close contact with someone who has COVID or you develop fever 100.4 or greater, new onset of sneezing, cough, sore throat, shortness of breath or body aches.  If you test positive for Covid or have been in contact with anyone that has tested positive in the last 10 days please notify you surgeon.    Your procedure is scheduled on:  Friday  May 04, 2022  Report to [facility] Main Entrance: [place] entrance where the [employer] is available.   Report to admitting at: 07:15  AM  +++++Call this number if you have any questions or problems the morning of surgery [phone]  DO NOT EAT OR DRINK ANYTHING AFTER MIDNIGHT THE NIGHT PRIOR TO YOUR SURGERY / PROCEDURE.   FOLLOW BOWEL PREP AND ANY ADDITIONAL PRE OP INSTRUCTIONS YOU RECEIVED FROM YOUR SURGEON'S OFFICE!!!   Oral Hygiene is also important to reduce your risk of infection.         Remember - BRUSH YOUR TEETH THE MORNING OF SURGERY WITH YOUR REGULAR TOOTHPASTE  Do NOT smoke after Midnight the night before surgery.  Diabetic Medication: OZEMPIC- Do not inject x 7 days prior to your surgery. You may resume injections the week after your surgery. METFORMIN-  Take usual dose the day before your surgery but DO NOT TAKE on your surgery day (05-04-2022)   Take ONLY these medicines the morning of surgery with A SIP OF WATER: ???   If You have been diagnosed with Sleep Apnea - Bring CPAP mask and tubing day of surgery. We will provide you with a CPAP machine on the day of your surgery.                   You may not have any metal on your body including hair pins, jewelry, and body piercing  Do not wear make-up, lotions, powders, perfumes or deodorant  Do not wear nail polish including gel and S&S, artificial / acrylic nails, or any other type of covering on natural nails including finger and toenails. If you have artificial nails, gel coating, etc., that needs to be removed by a nail salon, Please have this removed prior to surgery. Not doing so may mean that your surgery could be cancelled or delayed if the Surgeon or anesthesia staff feels like they are unable to monitor you safely.   Do not shave 48 hours prior to surgery  to avoid nicks in your skin which may contribute to postoperative infections.   Contacts, Hearing Aids, dentures or bridgework may not be worn into surgery. DENTURES WILL BE REMOVED PRIOR TO SURGERY PLEASE DO NOT APPLY "Poly grip" OR ADHESIVES!!!  Patients discharged on the day of surgery will not be allowed to drive home.  Someone NEEDS to stay with you for the first 24 hours after anesthesia.  Do not bring your home medications to the hospital. The Pharmacy will dispense medications listed on your medication list to you during your admission in the Hospital.  Special Instructions: Bring a copy of your healthcare power of attorney and living will  documents the day of surgery, if you wish to have them scanned into your [place] Medical Records- EPIC  Please read over the following fact sheets you were given: IF YOU HAVE QUESTIONS ABOUT YOUR [place], [place] [phone].   [place] - Preparing for Surgery Before surgery, you can play an important role.  Because skin is not sterile, your skin needs to be as free of germs as possible.  You can reduce the number of germs on your skin by washing with CHG (chlorahexidine gluconate) soap before surgery.  CHG is an antiseptic cleaner which kills germs and bonds with the skin to continue killing germs even after washing. Please DO NOT use if you have an allergy to CHG or antibacterial soaps.  If your skin becomes reddened/irritated stop using the CHG and inform your nurse when you arrive at Short Stay. Do not shave (including legs and underarms) for at least 48 hours prior to the first CHG shower.  You may shave your face/neck.  Please follow these instructions carefully:  1.  Shower with CHG Soap the night before surgery and the  morning of surgery.  2.  If you choose to wash your hair, wash your hair first as usual with your normal  shampoo.  3.  After you shampoo, rinse your hair and body thoroughly to remove the shampoo.                             4.  Use CHG as you would any other liquid soap.  You can apply chg directly to the skin and wash.  Gently with a scrungie or clean washcloth.  5.  Apply the CHG Soap to your body ONLY FROM THE NECK DOWN.   Do not use on face/ open                           Wound or open sores. Avoid contact with eyes, ears mouth and genitals (private parts).                       Wash face,  Genitals (private parts) with your normal soap.             6.  Wash thoroughly, paying special attention to the area where your  surgery  will be performed.  7.  Thoroughly rinse your body with warm water from the neck down.  8.  DO NOT shower/wash with  your normal soap after using and rinsing off the CHG Soap.            9.  Pat yourself dry with a clean towel.            10.  Wear clean pajamas.  11.  Place clean sheets on your bed the night of your first shower and do not  sleep with pets.  ON THE DAY OF SURGERY : Do not apply any lotions/deodorants the morning of surgery.  Please wear clean clothes to the hospital/surgery center.    FAILURE TO FOLLOW THESE INSTRUCTIONS MAY RESULT IN THE CANCELLATION OF YOUR SURGERY  PATIENT SIGNATURE_________________________________  NURSE SIGNATURE__________________________________  ________________________________________________________________________

## 2022-04-21 NOTE — Progress Notes (Signed)
COVID Vaccine received:  '[]'$  No '[x]'$  Yes Date of any COVID positive Test in last 90 days:  PCP - Dimas Chyle, MD Cardiologist -   Chest x-ray -  EKG -  11-04-2018 epic   will repeat at PST Stress Test -  ECHO -  Cardiac Cath -   PCR screen: '[]'$  Ordered & Completed                      '[]'$   No Order but Needs PROFEND                      '[x]'$   N/A for this surgery  Surgery Plan:  '[x]'$  Ambulatory                            '[]'$  Outpatient in bed                            '[]'$  Admit  Anesthesia:    '[x]'$  General  '[]'$  Spinal                           '[]'$   Choice '[]'$   MAC  Bowel Prep - '[x]'$  No  '[]'$   Yes ______  Pacemaker / ICD device '[x]'$  No '[]'$  Yes        Device order form faxed '[x]'$  No    '[]'$   Yes      Faxed to:  Spinal Cord Stimulator:'[x]'$  No '[]'$  Yes      (Remind patient to bring remote DOS) Other Implants:   History of Sleep Apnea? '[x]'$  No '[]'$  Yes   CPAP used?- '[x]'$  No '[]'$  Yes    Does the patient monitor blood sugar? '[x]'$  No '[]'$  Yes  '[]'$  N/A  Patient has: '[]'$  Pre-DM   '[]'$  DM1  '[x]'$   DM2 Does patient have a [employer] or Dexacom? '[]'$  No '[]'$  Yes   Fasting Blood Sugar Ranges-  Checks Blood Sugar _____ times a day  GLP1 agonist-  Ozempic  GLP1 instructions:  SGLT-2 inhibitors- none SGLT-2 instructions:   Other Diabetic medications/ instructions: Metformin 750 mg ????  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   Activity level: Patient is able / unable to climb a flight of stairs without difficulty; '[]'$  No CP  '[]'$  No SOB, but would have ___   Patient can / can not perform ADLs without assistance.   Anesthesia review: DM2, anemia, HTN  Patient denies shortness of breath, fever, cough and chest pain at PAT appointment.  Patient verbalized understanding and agreement to the Pre-Surgical Instructions that were given to them at this PAT  appointment. Patient was also educated of the need to review these PAT instructions again prior to his/her surgery.I reviewed the appropriate phone numbers to call if they have any and questions or concerns.

## 2022-04-24 ENCOUNTER — Other Ambulatory Visit (HOSPITAL_COMMUNITY): Payer: Self-pay

## 2022-04-24 ENCOUNTER — Encounter (HOSPITAL_COMMUNITY)
Admission: RE | Admit: 2022-04-24 | Discharge: 2022-04-24 | Disposition: A | Discharge status: Home / Self Care | Payer: Commercial Managed Care - PPO | Source: Ambulatory Visit | Attending: Anesthesiology | Admitting: Anesthesiology

## 2022-04-24 DIAGNOSIS — E119 Type 2 diabetes mellitus without complications: Secondary | ICD-10-CM

## 2022-04-24 DIAGNOSIS — I10 Essential (primary) hypertension: Secondary | ICD-10-CM

## 2022-04-25 ENCOUNTER — Other Ambulatory Visit (HOSPITAL_COMMUNITY): Payer: Self-pay

## 2022-04-25 ENCOUNTER — Other Ambulatory Visit: Payer: Self-pay

## 2022-05-04 ENCOUNTER — Ambulatory Visit: Admit: 2022-05-04 | Payer: Commercial Managed Care - PPO | Admitting: Surgery

## 2022-05-04 SURGERY — REPAIR, HERNIA, UMBILICAL, ADULT
Anesthesia: General

## 2022-05-18 ENCOUNTER — Other Ambulatory Visit (HOSPITAL_COMMUNITY): Payer: Self-pay

## 2022-05-18 ENCOUNTER — Other Ambulatory Visit: Payer: Self-pay | Admitting: Family Medicine

## 2022-05-21 MED ORDER — OZEMPIC (2 MG/DOSE) 8 MG/3ML ~~LOC~~ SOPN
2.0000 mg | PEN_INJECTOR | SUBCUTANEOUS | 1 refills | Status: DC
  Filled 2022-05-21: qty 9, 84d supply, fill #0

## 2022-05-22 ENCOUNTER — Encounter: Payer: Self-pay | Admitting: Family Medicine

## 2022-05-22 ENCOUNTER — Other Ambulatory Visit: Payer: Self-pay

## 2022-05-22 ENCOUNTER — Other Ambulatory Visit (HOSPITAL_COMMUNITY): Payer: Self-pay

## 2022-05-22 ENCOUNTER — Ambulatory Visit: Payer: Commercial Managed Care - PPO | Admitting: Family Medicine

## 2022-05-22 VITALS — BP 146/89 | HR 79 | Temp 97.8°F | Ht 61.0 in | Wt 249.0 lb

## 2022-05-22 DIAGNOSIS — I152 Hypertension secondary to endocrine disorders: Secondary | ICD-10-CM

## 2022-05-22 DIAGNOSIS — E1159 Type 2 diabetes mellitus with other circulatory complications: Secondary | ICD-10-CM | POA: Diagnosis not present

## 2022-05-22 DIAGNOSIS — E118 Type 2 diabetes mellitus with unspecified complications: Secondary | ICD-10-CM

## 2022-05-22 DIAGNOSIS — E611 Iron deficiency: Secondary | ICD-10-CM

## 2022-05-22 DIAGNOSIS — N946 Dysmenorrhea, unspecified: Secondary | ICD-10-CM

## 2022-05-22 MED ORDER — TIRZEPATIDE 15 MG/0.5ML ~~LOC~~ SOAJ
15.0000 mg | SUBCUTANEOUS | 0 refills | Status: DC
  Filled 2022-05-22: qty 2, 28d supply, fill #0
  Filled 2022-06-19: qty 2, 28d supply, fill #1
  Filled 2022-06-20 – 2022-09-07 (×8): qty 2, 28d supply, fill #2

## 2022-05-22 MED ORDER — AMLODIPINE BESYLATE 5 MG PO TABS
5.0000 mg | ORAL_TABLET | Freq: Every day | ORAL | 3 refills | Status: DC
  Filled 2022-05-22: qty 90, 90d supply, fill #0
  Filled 2022-07-13 – 2022-08-09 (×2): qty 90, 90d supply, fill #1
  Filled 2022-12-18: qty 90, 90d supply, fill #2

## 2022-05-22 MED ORDER — IRON (FERROUS SULFATE) 325 (65 FE) MG PO TABS
1.0000 | ORAL_TABLET | ORAL | 3 refills | Status: DC
  Filled 2022-05-22 – 2022-05-25 (×3): qty 45, 90d supply, fill #0
  Filled 2022-07-10 – 2022-09-26 (×6): qty 45, 90d supply, fill #1

## 2022-05-22 NOTE — Progress Notes (Signed)
   Christine Best is a 47 y.o. female who presents today for an office visit.  Assessment/Plan:  Chronic Problems Addressed Today: Type 2 diabetes mellitus with complication (HCC) 123456 7.2 on doctor's day labs last week.  Will switch Ozempic to Mounjaro 15 mg weekly.  She is aware of potential side effects.  Will recheck A1c in 3 months.  Hypertension associated with diabetes ([place]) Not controlled today.  Her home readings have been slightly elevated as well.  Will restart Norvasc 5 mg daily.  She will continue to monitor at home.  She will come back in 3 months for recheck though she will let us know if persistently elevated at home.  Hopefully she will have some weight loss with [facility] which will also help with her blood pressure.  Dysmenorrhea Still having ongoing issues with dysfunctional uterine bleeding secondary to fibroids.  She has been following with OB/GYN for this.  Does not feel like birth control pills have been particularly effective and may be considering surgical option at some point in the future.  Iron deficiency Secondary to dysfunctional uterine bleeding.  Last hemoglobin was 8.7 which is near her baseline.  She has not been able to tolerate high doses of oral iron due to constipation.  Recommended patient take ferrous sulfate 65 mg every other day which will hopefully help mitigate some of the GI side effects.  We did discuss potentially getting an iron infusion however we will try oral regimen first.  If she is not able to tolerate oral regimen we will have her set up for infusion.  Did discuss that treating her underlying bleeding would be the definitive treatment and she will follow-up with gynecology soon for this as above.     Subjective:  HPI:  See A/p for status of chronic conditions.  Her main concern today is elevated A1c.  Had Doctor's day labs done last week.  A1c was 7.2.  She has been Ozempic 2 mg weekly.  She has been tolerating well.  She has also had  concern for elevated blood pressure readings.  She has previously been on Norvasc for this but has not been on any medications for the last couple of years.  Typically home readings have been in the 140s to 150s for last several weeks to months.  She tried taking some leftover Norvasc but thinks it was out of date.  She is also concerned about anemia.  She is feeling more tired and fatigued.  Most recent hemoglobin 8.7.  She is having quite a bit of issues with dysfunctional uterine bleeding and has been following with gynecology for this.       Objective:  Physical Exam: BP (!) 146/89   Pulse 79   Temp 97.8 F (36.6 C) (Temporal)   Ht 5\' 1"  (1.549 m)   Wt 249 lb (112.9 kg)   LMP 04/26/2022   SpO2 100%   BMI 47.05 kg/m   Gen: No acute distress, resting comfortably CV: Regular rate and rhythm with no murmurs appreciated Pulm: Normal work of breathing, clear to auscultation bilaterally with no crackles, wheezes, or rhonchi Neuro: Grossly normal, moves all extremities Psych: Normal affect and thought content      Christine Best M. Jerline Pain, MD 05/22/2022 11:11 AM

## 2022-05-22 NOTE — Patient Instructions (Signed)
It was very nice to see you today!  We will switch your Ozempic to [facility].  Please let me know if you have any side effects with this.  Please take iron every other day.  Let me know if you have any bad side effects with this.  We will start Norvasc 5 mg daily.  Send me a message in a few weeks to let me know how your blood pressure is looking.  I will see back in 3 months for your annual.  Please come back to see [place] sooner if needed.  Take care, Dr Jerline Pain  PLEASE NOTE:  If you had any lab tests, please let us know if you have not heard back within a few days. You may see your results on mychart before we have a chance to review them but we will give you a call once they are reviewed by [place].   If we ordered any referrals today, please let us know if you have not heard from their office within the next week.   If you had any urgent prescriptions sent in today, please check with the pharmacy within an hour of our visit to make sure the prescription was transmitted appropriately.   Please try these tips to maintain a healthy lifestyle:  Eat at least 3 REAL meals and 1-2 snacks per day.  Aim for no more than 5 hours between eating.  If you eat breakfast, please do so within one hour of getting up.   Each meal should contain half fruits/vegetables, one quarter protein, and one quarter carbs (no bigger than a computer mouse)  Cut down on sweet beverages. This includes juice, soda, and sweet tea.   Drink at least 1 glass of water with each meal and aim for at least 8 glasses per day  Exercise at least 150 minutes every week.

## 2022-05-22 NOTE — Assessment & Plan Note (Signed)
Not controlled today.  Her home readings have been slightly elevated as well.  Will restart Norvasc 5 mg daily.  She will continue to monitor at home.  She will come back in 3 months for recheck though she will let us know if persistently elevated at home.  Hopefully she will have some weight loss with [facility] which will also help with her blood pressure.

## 2022-05-22 NOTE — Assessment & Plan Note (Signed)
Secondary to dysfunctional uterine bleeding.  Last hemoglobin was 8.7 which is near her baseline.  She has not been able to tolerate high doses of oral iron due to constipation.  Recommended patient take ferrous sulfate 65 mg every other day which will hopefully help mitigate some of the GI side effects.  We did discuss potentially getting an iron infusion however we will try oral regimen first.  If she is not able to tolerate oral regimen we will have her set up for infusion.  Did discuss that treating her underlying bleeding would be the definitive treatment and she will follow-up with gynecology soon for this as above.

## 2022-05-22 NOTE — Assessment & Plan Note (Signed)
A1c 7.2 on doctor's day labs last week.  Will switch Ozempic to Mounjaro 15 mg weekly.  She is aware of potential side effects.  Will recheck A1c in 3 months.

## 2022-05-22 NOTE — Assessment & Plan Note (Signed)
Still having ongoing issues with dysfunctional uterine bleeding secondary to fibroids.  She has been following with OB/GYN for this.  Does not feel like birth control pills have been particularly effective and may be considering surgical option at some point in the future.

## 2022-05-24 ENCOUNTER — Other Ambulatory Visit (HOSPITAL_COMMUNITY): Payer: Self-pay

## 2022-05-24 ENCOUNTER — Other Ambulatory Visit: Payer: Self-pay

## 2022-05-25 ENCOUNTER — Other Ambulatory Visit (HOSPITAL_COMMUNITY): Payer: Self-pay

## 2022-05-25 ENCOUNTER — Other Ambulatory Visit: Payer: Self-pay

## 2022-06-01 ENCOUNTER — Other Ambulatory Visit (HOSPITAL_COMMUNITY): Payer: Self-pay

## 2022-06-01 ENCOUNTER — Encounter: Payer: Self-pay | Admitting: Family Medicine

## 2022-06-01 NOTE — Telephone Encounter (Signed)
Please advise 

## 2022-06-01 NOTE — Telephone Encounter (Signed)
Ok to send in new rx.  Christine Best. [name], MD 06/01/2022 2:02 PM

## 2022-06-04 ENCOUNTER — Other Ambulatory Visit: Payer: Self-pay

## 2022-06-04 ENCOUNTER — Other Ambulatory Visit: Payer: Self-pay | Admitting: *Deleted

## 2022-06-04 ENCOUNTER — Other Ambulatory Visit (HOSPITAL_COMMUNITY): Payer: Self-pay

## 2022-06-04 MED ORDER — TIRZEPATIDE 12.5 MG/0.5ML ~~LOC~~ SOAJ
12.5000 mg | SUBCUTANEOUS | 0 refills | Status: DC
  Filled 2022-06-04 – 2022-07-13 (×3): qty 2, 28d supply, fill #0

## 2022-06-04 NOTE — Telephone Encounter (Signed)
Spoke with pharmacy, still out of Mounjaro 15mg   They have Mounjaro 12.5mg  in stock Ok to send a new Rx?

## 2022-06-04 NOTE — Telephone Encounter (Signed)
Ok to send in 12.5mg  dose if they have that in stock.  Christine Best. [name], MD 06/04/2022 12:59 PM

## 2022-06-04 NOTE — Telephone Encounter (Signed)
Rx send to [facility] pharmacy  ?

## 2022-06-05 ENCOUNTER — Other Ambulatory Visit (HOSPITAL_COMMUNITY): Payer: Self-pay

## 2022-06-05 ENCOUNTER — Other Ambulatory Visit: Payer: Self-pay

## 2022-06-16 DIAGNOSIS — Z794 Long term (current) use of insulin: Secondary | ICD-10-CM | POA: Diagnosis not present

## 2022-06-16 DIAGNOSIS — E1165 Type 2 diabetes mellitus with hyperglycemia: Secondary | ICD-10-CM | POA: Diagnosis not present

## 2022-06-19 ENCOUNTER — Other Ambulatory Visit (HOSPITAL_COMMUNITY): Payer: Self-pay

## 2022-06-20 ENCOUNTER — Other Ambulatory Visit (HOSPITAL_COMMUNITY): Payer: Self-pay

## 2022-06-20 ENCOUNTER — Telehealth: Payer: Self-pay | Admitting: *Deleted

## 2022-06-20 NOTE — Telephone Encounter (Signed)
[facility] care form was faxed to [phone] on 06/19/2022  Form placed to be scan on Pt chart

## 2022-07-10 ENCOUNTER — Other Ambulatory Visit (HOSPITAL_COMMUNITY): Payer: Self-pay

## 2022-07-10 ENCOUNTER — Other Ambulatory Visit: Payer: Self-pay

## 2022-07-12 NOTE — Patient Instructions (Addendum)
DUE TO COVID-19 ONLY TWO VISITORS  (aged 45 and older)  ARE ALLOWED TO COME WITH YOU AND STAY IN THE WAITING ROOM ONLY DURING PRE OP AND PROCEDURE.   **NO VISITORS ARE ALLOWED IN THE SHORT STAY AREA OR RECOVERY ROOM!!**  IF YOU WILL BE ADMITTED INTO THE HOSPITAL YOU ARE ALLOWED ONLY FOUR SUPPORT PEOPLE DURING VISITATION HOURS ONLY (7 AM -8PM)   The support person(s) must pass our screening, gel in and out, and wear a mask at all times, including in the patient's room. Patients must also wear a mask when staff or their support person are in the room. Visitors GUEST BADGE MUST BE WORN VISIBLY  One adult visitor may remain with you overnight and MUST be in the room by 8 P.M.     Your procedure is scheduled on: 08/15/22   Report to [facility] Main Entrance    Report to admitting at  7:45 AM   Call this number if you have problems the morning of surgery [phone]   Do not eat food or drink :After Midnight.             If you have questions, please contact your surgeon's office.      Oral Hygiene is also important to reduce your risk of infection.                                    Remember - BRUSH YOUR TEETH THE MORNING OF SURGERY WITH YOUR REGULAR TOOTHPASTE     Take these medicines the morning of surgery with A SIP OF WATER: Amlodipine  DO NOT TAKE ANY ORAL DIABETIC MEDICATIONS DAY OF YOUR SURGERY  Please hold your Mounjaro for 7 days no dose after 6/18  Bring CPAP mask and tubing day of surgery.                              You may not have any metal on your body including hair pins, jewelry, and body piercing             Do not wear make-up, lotions, powders, perfumes/cologne, or deodorant  Do not wear nail polish including gel and S&S, artificial/acrylic nails, or any other type of covering on natural nails including finger and toenails. If you have artificial nails, gel coating, etc. that needs to be removed by a nail salon please have this removed prior to  surgery or surgery may need to be canceled/ delayed if the surgeon/ anesthesia feels like they are unable to be safely monitored.   Do not shave  48 hours prior to surgery.     Do not bring valuables to the hospital. [facility] IS NOT             RESPONSIBLE   FOR VALUABLES.   Contacts, glasses, or bridgework may not be worn into surgery.   Bring small overnight bag day of surgery.   DO NOT BRING YOUR HOME MEDICATIONS TO THE HOSPITAL. PHARMACY WILL DISPENSE MEDICATIONS LISTED ON YOUR MEDICATION LIST TO YOU DURING YOUR ADMISSION IN THE HOSPITAL!    Patients discharged on the day of surgery will not be allowed to drive home.  Someone NEEDS to stay with you for the first 24 hours after anesthesia.   Special Instructions: Bring a copy of your healthcare power of attorney and living will documents the day  of surgery if you haven't scanned them before.              Please read over the following fact sheets you were given: IF YOU HAVE QUESTIONS ABOUT YOUR PRE-OP INSTRUCTIONS PLEASE CALL [phone]    [facility] - Preparing for Surgery Before surgery, you can play an important role.  Because skin is not sterile, your skin needs to be as free of germs as possible.  You can reduce the number of germs on your skin by washing with CHG (chlorahexidine gluconate) soap before surgery.  CHG is an antiseptic cleaner which kills germs and bonds with the skin to continue killing germs even after washing. Please DO NOT use if you have an allergy to CHG or antibacterial soaps.  If your skin becomes reddened/irritated stop using the CHG and inform your nurse when you arrive at Short Stay. Do not shave (including legs and underarms) for at least 48 hours prior to the first CHG shower.   Please follow these instructions carefully:  1.  Shower with CHG Soap the night before surgery and the  morning of Surgery.  2.  If you choose to wash your hair, wash your hair first as usual with your  normal  shampoo.  3.   After you shampoo, rinse your hair and body thoroughly to remove the  shampoo.                            4.  Use CHG as you would any other liquid soap.  You can apply chg directly  to the skin and wash                       Gently with a scrungie or clean washcloth.  5.  Apply the CHG Soap to your body ONLY FROM THE NECK DOWN.   Do not use on face/ open                           Wound or open sores. Avoid contact with eyes, ears mouth and genitals (private parts).                       Wash face,  Genitals (private parts) with your normal soap.             6.  Wash thoroughly, paying special attention to the area where your surgery  will be performed.  7.  Thoroughly rinse your body with warm water from the neck down.  8.  DO NOT shower/wash with your normal soap after using and rinsing off  the CHG Soap.             9.  Pat yourself dry with a clean towel.            10.  Wear clean pajamas.            11.  Place clean sheets on your bed the night of your first shower and do not  sleep with pets. Day of Surgery : Do not apply any lotions/deodorants the morning of surgery.  Please wear clean clothes to the hospital/surgery center.  FAILURE TO FOLLOW THESE INSTRUCTIONS MAY RESULT IN THE CANCELLATION OF YOUR SURGERY  PATIENT SIGNATURE_________________________________   ________________________________________________________________________

## 2022-07-13 ENCOUNTER — Other Ambulatory Visit (HOSPITAL_COMMUNITY): Payer: Self-pay

## 2022-07-13 ENCOUNTER — Other Ambulatory Visit: Payer: Self-pay

## 2022-07-17 ENCOUNTER — Other Ambulatory Visit: Payer: Self-pay

## 2022-07-18 ENCOUNTER — Other Ambulatory Visit (HOSPITAL_COMMUNITY): Payer: Self-pay

## 2022-07-18 ENCOUNTER — Encounter (HOSPITAL_COMMUNITY): Payer: Self-pay

## 2022-07-19 ENCOUNTER — Other Ambulatory Visit: Payer: Self-pay

## 2022-07-19 ENCOUNTER — Other Ambulatory Visit (HOSPITAL_COMMUNITY): Payer: Self-pay

## 2022-07-23 ENCOUNTER — Other Ambulatory Visit (HOSPITAL_COMMUNITY): Payer: Self-pay

## 2022-07-24 ENCOUNTER — Ambulatory Visit: Payer: Self-pay | Admitting: Surgery

## 2022-07-24 NOTE — Progress Notes (Signed)
Sent message, via epic in basket, requesting orders in epic from surgeon.  

## 2022-07-24 NOTE — H&P (Signed)
Christine Best [id number]   Referring Provider:  Parker, Caleb M, MD   Subjective   Chief Complaint: New Consultation and Umbilical Hernia     History of Present Illness:    Very pleasant 46-year-old woman with history of anemia, chronic constipation, hypertension, pelvic adhesions, type 2 diabetes (on Ozempic), uterine fibroids, dysmenorrhea and previous abdominal surgery of robot-assisted myomectomy/ endometriosis excision (February 2020, Dr. Yalcinkaya), C-section (June 2021) who presents for evaluation of an umbilical hernia.  She noticed this during her pregnancy.  Previously was asymptomatic but in recent months has become minimally painful/uncomfortable, aggravated by certain activities.  Associated GI symptoms although she does note more discomfort when she is constipated.  Denies tobacco use.  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   Review of Systems: A complete review of systems was obtained from the patient.  I have reviewed this information and discussed as appropriate with the patient.  See HPI as well for other ROS.   Medical History: Past Medical History:  Diagnosis Date   Diabetes mellitus, type 2 (CMS-HCC)    Hypertension     Patient Active Problem List  Diagnosis   Pre-existing type 2 diabetes mellitus during pregnancy, antepartum    Past Surgical History:  Procedure Laterality Date   ADENOIDECTOMY     LAPAROSCOPIC MYOMECTOMY       Allergies  Allergen Reactions   Lisinopril Other (See Comments) and Swelling    angioedema    Current Outpatient Medications on File Prior to Visit  Medication Sig Dispense Refill   aspirin 81 MG chewable tablet Take by mouth     FREESTYLE LIBRE 14 DAY SENSOR kit      metFORMIN (GLUCOPHAGE-XR) 750 MG XR tablet Take by mouth     OMNIPOD DASH 5 PACK POD Crtg      prenatal vit-iron fum-folic ac (PRENAVITE) tablet Take by mouth     No current  facility-administered medications on file prior to visit.    Family History  Problem Relation Age of Onset   Obesity Mother    Diabetes Mother    Diabetes Father    Obesity Sister    Diabetes Brother      Social History   Tobacco Use  Smoking Status Never  Smokeless Tobacco Never     Social History   Socioeconomic History   Marital status: Single  Tobacco Use   Smoking status: Never   Smokeless tobacco: Never    Objective:    Vitals:   03/15/22 0836  BP: (!) 145/90  Pulse: 98  Temp: 36.7 C (98 F)  SpO2: 98%  Weight: (!) 114.3 kg (252 lb)  Height: 157.5 cm (5' 2")    Body mass index is 46.09 kg/m.  Gen: A&Ox3, no distress  Unlabored respiration Abdomen soft, nontender, nondistended; partially reducible umbilical hernia which is focally tender.  Fascial defect is not definitively palpable habitus  Assessment and Plan:  Diagnoses and all orders for this visit:  Umbilical hernia without obstruction and without gangrene    Discussed the relevant anatomy and options for repair.  This appears to be a small primary umbilical hernia; would recommend open repair possibly with mesh depending on fascial defect size at time of surgery.  We discussed the procedure including technical details, risks of bleeding, infection, pain, scarring, injury to intra-abdominal structures, seroma/hematoma, wound healing issues or direct cosmetic outcome, as well as risk of hernia recurrence.  Her risk of recurrence is   increased secondary to obesity.  Given increasing pain from the hernia however, I think repair is warranted at this point.  Questions welcomed and answered to her satisfaction, we will schedule at her convenience.  Eleftherios Dudenhoeffer AMANDA Tammy Ericsson, MD  

## 2022-07-24 NOTE — H&P (View-Only) (Signed)
Christine Best [id number]   Referring Provider:  Parker, Caleb M, MD   Subjective   Chief Complaint: New Consultation and Umbilical Hernia     History of Present Illness:    Very pleasant 46-year-old woman with history of anemia, chronic constipation, hypertension, pelvic adhesions, type 2 diabetes (on Ozempic), uterine fibroids, dysmenorrhea and previous abdominal surgery of robot-assisted myomectomy/ endometriosis excision (February 2020, Dr. Yalcinkaya), C-section (June 2021) who presents for evaluation of an umbilical hernia.  She noticed this during her pregnancy.  Previously was asymptomatic but in recent months has become minimally painful/uncomfortable, aggravated by certain activities.  Associated GI symptoms although she does note more discomfort when she is constipated.  Denies tobacco use.  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   Review of Systems: A complete review of systems was obtained from the patient.  I have reviewed this information and discussed as appropriate with the patient.  See HPI as well for other ROS.   Medical History: Past Medical History:  Diagnosis Date   Diabetes mellitus, type 2 (CMS-HCC)    Hypertension     Patient Active Problem List  Diagnosis   Pre-existing type 2 diabetes mellitus during pregnancy, antepartum    Past Surgical History:  Procedure Laterality Date   ADENOIDECTOMY     LAPAROSCOPIC MYOMECTOMY       Allergies  Allergen Reactions   Lisinopril Other (See Comments) and Swelling    angioedema    Current Outpatient Medications on File Prior to Visit  Medication Sig Dispense Refill   aspirin 81 MG chewable tablet Take by mouth     FREESTYLE LIBRE 14 DAY SENSOR kit      metFORMIN (GLUCOPHAGE-XR) 750 MG XR tablet Take by mouth     OMNIPOD DASH 5 PACK POD Crtg      prenatal vit-iron fum-folic ac (PRENAVITE) tablet Take by mouth     No current  facility-administered medications on file prior to visit.    Family History  Problem Relation Age of Onset   Obesity Mother    Diabetes Mother    Diabetes Father    Obesity Sister    Diabetes Brother      Social History   Tobacco Use  Smoking Status Never  Smokeless Tobacco Never     Social History   Socioeconomic History   Marital status: Single  Tobacco Use   Smoking status: Never   Smokeless tobacco: Never    Objective:    Vitals:   03/15/22 0836  BP: (!) 145/90  Pulse: 98  Temp: 36.7 C (98 F)  SpO2: 98%  Weight: (!) 114.3 kg (252 lb)  Height: 157.5 cm (5' 2")    Body mass index is 46.09 kg/Best.  Gen: A&Ox3, no distress  Unlabored respiration Abdomen soft, nontender, nondistended; partially reducible umbilical hernia which is focally tender.  Fascial defect is not definitively palpable habitus  Assessment and Plan:  Diagnoses and all orders for this visit:  Umbilical hernia without obstruction and without gangrene    Discussed the relevant anatomy and options for repair.  This appears to be a small primary umbilical hernia; would recommend open repair possibly with mesh depending on fascial defect size at time of surgery.  We discussed the procedure including technical details, risks of bleeding, infection, pain, scarring, injury to intra-abdominal structures, seroma/hematoma, wound healing issues or direct cosmetic outcome, as well as risk of hernia recurrence.  Her risk of recurrence is   increased secondary to obesity.  Given increasing pain from the hernia however, I think repair is warranted at this point.  Questions welcomed and answered to her satisfaction, we will schedule at her convenience.  [name] [name], MD  

## 2022-07-31 ENCOUNTER — Encounter (HOSPITAL_COMMUNITY): Payer: Self-pay

## 2022-07-31 ENCOUNTER — Encounter (HOSPITAL_COMMUNITY)
Admission: RE | Admit: 2022-07-31 | Discharge: 2022-07-31 | Disposition: A | Discharge status: Home / Self Care | Payer: Commercial Managed Care - PPO | Source: Ambulatory Visit | Attending: Surgery | Admitting: Surgery

## 2022-07-31 ENCOUNTER — Other Ambulatory Visit: Payer: Self-pay

## 2022-07-31 VITALS — BP 152/92 | HR 78 | Temp 98.4°F | Resp 18 | Ht 62.0 in | Wt 244.0 lb

## 2022-07-31 DIAGNOSIS — Z01818 Encounter for other preprocedural examination: Secondary | ICD-10-CM | POA: Diagnosis not present

## 2022-07-31 DIAGNOSIS — E118 Type 2 diabetes mellitus with unspecified complications: Secondary | ICD-10-CM | POA: Diagnosis not present

## 2022-07-31 HISTORY — DX: Personal history of Methicillin resistant Staphylococcus aureus infection: Z86.14

## 2022-07-31 LAB — BASIC METABOLIC PANEL
Anion gap: 8 (ref 5–15)
BUN: 7 mg/dL (ref 6–20)
CO2: 23 mmol/L (ref 22–32)
Calcium: 8.3 mg/dL — ABNORMAL LOW (ref 8.9–10.3)
Chloride: 107 mmol/L (ref 98–111)
Creatinine, Ser: 0.66 mg/dL (ref 0.44–1.00)
GFR, Estimated: >60 mL/min (ref >60)
Glucose, Bld: 118 mg/dL — ABNORMAL HIGH (ref 70–99)
Potassium: 3.1 mmol/L — ABNORMAL LOW (ref 3.5–5.1)
Sodium: 138 mmol/L (ref 135–145)

## 2022-07-31 LAB — CBC
HCT: 35 % — ABNORMAL LOW (ref 36.0–46.0)
Hemoglobin: 10.4 g/dL — ABNORMAL LOW (ref 12.0–15.0)
MCH: 20.7 pg — ABNORMAL LOW (ref 26.0–34.0)
MCHC: 29.7 g/dL — ABNORMAL LOW (ref 30.0–36.0)
MCV: 69.7 fL — ABNORMAL LOW (ref 80.0–100.0)
Platelets: 382 10*3/uL (ref 150–400)
RBC: 5.02 MIL/uL (ref 3.87–5.11)
RDW: 19.7 % — ABNORMAL HIGH (ref 11.5–15.5)
WBC: 5.4 10*3/uL (ref 4.0–10.5)
nRBC: 0 % (ref 0.0–0.2)

## 2022-07-31 LAB — SURGICAL PCR SCREEN
MRSA, PCR: NEGATIVE
Staphylococcus aureus: NEGATIVE

## 2022-07-31 LAB — GLUCOSE, CAPILLARY: Glucose-Capillary: 115 mg/dL — ABNORMAL HIGH (ref 70–99)

## 2022-07-31 LAB — HEMOGLOBIN A1C
Hgb A1c MFr Bld: 6.1 % — ABNORMAL HIGH (ref 4.8–5.6)
Mean Plasma Glucose: 128.37 mg/dL

## 2022-07-31 NOTE — Progress Notes (Signed)
Anesthesia note:     PCP - Dr. Lupita Shutter Cardiologist -none Other-   Chest x-ray - no EKG - 07/31/22 Stress Test - no ECHO - no Cardiac Cath - no CABG-no Pacemaker/ICD device last checked:NA  Sleep Study - no CPAP -   Last dose of Moungaro is 6/18 CBG at PAT visit-115 Fasting Blood Sugar at home-40-110 Checks Blood Sugar _dexcom__will be on Rt arm__  Blood Thinner:no Blood Thinner Instructions: Aspirin Instructions: Last Dose:  Anesthesia review:  /No  reason:  Patient denies shortness of breath, fever, cough and chest pain at PAT appointment. Pt has no SOB with activities.    Patient verbalized understanding of instructions that were given to them at the PAT appointment. Patient was also instructed that they will need to review over the PAT instructions again at home before surgery.yes

## 2022-08-02 ENCOUNTER — Other Ambulatory Visit (HOSPITAL_COMMUNITY): Payer: Self-pay

## 2022-08-06 ENCOUNTER — Other Ambulatory Visit (HOSPITAL_COMMUNITY): Payer: Self-pay

## 2022-08-09 ENCOUNTER — Other Ambulatory Visit (HOSPITAL_COMMUNITY): Payer: Self-pay

## 2022-08-09 ENCOUNTER — Other Ambulatory Visit: Payer: Self-pay

## 2022-08-15 ENCOUNTER — Ambulatory Visit (HOSPITAL_BASED_OUTPATIENT_CLINIC_OR_DEPARTMENT_OTHER): Payer: Commercial Managed Care - PPO | Admitting: Certified Registered Nurse Anesthetist

## 2022-08-15 ENCOUNTER — Encounter (HOSPITAL_COMMUNITY)
Admission: RE | Disposition: A | Discharge status: Home / Self Care | Payer: Self-pay | Source: Ambulatory Visit | Attending: Surgery

## 2022-08-15 ENCOUNTER — Encounter (HOSPITAL_COMMUNITY): Payer: Self-pay | Admitting: Surgery

## 2022-08-15 ENCOUNTER — Other Ambulatory Visit: Payer: Self-pay

## 2022-08-15 ENCOUNTER — Ambulatory Visit (HOSPITAL_COMMUNITY)
Admission: RE | Admit: 2022-08-15 | Discharge: 2022-08-15 | Disposition: A | Discharge status: Home / Self Care | Payer: Commercial Managed Care - PPO | Source: Ambulatory Visit | Attending: Surgery | Admitting: Surgery

## 2022-08-15 ENCOUNTER — Other Ambulatory Visit (HOSPITAL_COMMUNITY): Payer: Self-pay

## 2022-08-15 ENCOUNTER — Ambulatory Visit (HOSPITAL_COMMUNITY): Payer: Commercial Managed Care - PPO | Admitting: Certified Registered Nurse Anesthetist

## 2022-08-15 ENCOUNTER — Other Ambulatory Visit: Payer: Self-pay | Admitting: Family Medicine

## 2022-08-15 DIAGNOSIS — Z7984 Long term (current) use of oral hypoglycemic drugs: Secondary | ICD-10-CM | POA: Diagnosis not present

## 2022-08-15 DIAGNOSIS — E118 Type 2 diabetes mellitus with unspecified complications: Secondary | ICD-10-CM

## 2022-08-15 DIAGNOSIS — I10 Essential (primary) hypertension: Secondary | ICD-10-CM

## 2022-08-15 DIAGNOSIS — E119 Type 2 diabetes mellitus without complications: Secondary | ICD-10-CM

## 2022-08-15 DIAGNOSIS — Z7985 Long-term (current) use of injectable non-insulin antidiabetic drugs: Secondary | ICD-10-CM | POA: Diagnosis not present

## 2022-08-15 DIAGNOSIS — Z6841 Body Mass Index (BMI) 40.0 and over, adult: Secondary | ICD-10-CM | POA: Diagnosis not present

## 2022-08-15 DIAGNOSIS — K429 Umbilical hernia without obstruction or gangrene: Secondary | ICD-10-CM | POA: Diagnosis not present

## 2022-08-15 DIAGNOSIS — K420 Umbilical hernia with obstruction, without gangrene: Secondary | ICD-10-CM | POA: Diagnosis not present

## 2022-08-15 HISTORY — PX: UMBILICAL HERNIA REPAIR: SHX196

## 2022-08-15 LAB — GLUCOSE, CAPILLARY
Glucose-Capillary: 117 mg/dL — ABNORMAL HIGH (ref 70–99)
Glucose-Capillary: 114 mg/dL — ABNORMAL HIGH (ref 70–99)

## 2022-08-15 LAB — POCT PREGNANCY, URINE: Preg Test, Ur: NEGATIVE

## 2022-08-15 SURGERY — REPAIR, HERNIA, UMBILICAL, ADULT
Anesthesia: General

## 2022-08-15 MED ORDER — MIDAZOLAM HCL 2 MG/2ML IJ SOLN
INTRAMUSCULAR | Status: DC | PRN
  Administered 2022-08-15: 2 mg via INTRAVENOUS

## 2022-08-15 MED ORDER — FENTANYL CITRATE (PF) 100 MCG/2ML IJ SOLN
INTRAMUSCULAR | Status: AC
  Filled 2022-08-15: qty 2

## 2022-08-15 MED ORDER — CHLORHEXIDINE GLUCONATE 4 % EX SOLN: 60.0000 mL | Freq: Once | CUTANEOUS | Status: DC

## 2022-08-15 MED ORDER — BUPIVACAINE LIPOSOME 1.3 % IJ SUSP: 20.0000 mL | Freq: Once | INTRAMUSCULAR | Status: DC

## 2022-08-15 MED ORDER — 0.9 % SODIUM CHLORIDE (POUR BTL) OPTIME
TOPICAL | Status: DC | PRN
  Administered 2022-08-15: 1000 mL

## 2022-08-15 MED ORDER — ROCURONIUM BROMIDE 10 MG/ML (PF) SYRINGE
PREFILLED_SYRINGE | INTRAVENOUS | Status: DC | PRN
  Administered 2022-08-15: 50 mg via INTRAVENOUS

## 2022-08-15 MED ORDER — KETOROLAC TROMETHAMINE 30 MG/ML IJ SOLN
INTRAMUSCULAR | Status: DC | PRN
  Administered 2022-08-15: 30 mg via INTRAVENOUS

## 2022-08-15 MED ORDER — GABAPENTIN 300 MG PO CAPS
300.0000 mg | ORAL_CAPSULE | ORAL | Status: AC
  Administered 2022-08-15: 300 mg via ORAL
  Filled 2022-08-15: qty 1

## 2022-08-15 MED ORDER — DEXAMETHASONE SODIUM PHOSPHATE 10 MG/ML IJ SOLN
INTRAMUSCULAR | Status: DC | PRN
  Administered 2022-08-15: 10 mg via INTRAVENOUS

## 2022-08-15 MED ORDER — PROPOFOL 10 MG/ML IV BOLUS
INTRAVENOUS | Status: DC | PRN
  Administered 2022-08-15: 200 mg via INTRAVENOUS

## 2022-08-15 MED ORDER — MIDAZOLAM HCL 2 MG/2ML IJ SOLN
INTRAMUSCULAR | Status: AC
  Filled 2022-08-15: qty 2

## 2022-08-15 MED ORDER — FENTANYL CITRATE PF 50 MCG/ML IJ SOSY
PREFILLED_SYRINGE | INTRAMUSCULAR | Status: AC
  Filled 2022-08-15: qty 1

## 2022-08-15 MED ORDER — CEFAZOLIN SODIUM-DEXTROSE 2-4 GM/100ML-% IV SOLN
2.0000 g | INTRAVENOUS | Status: AC
  Administered 2022-08-15: 2 g via INTRAVENOUS
  Filled 2022-08-15: qty 100

## 2022-08-15 MED ORDER — DEXMEDETOMIDINE HCL IN NACL 80 MCG/20ML IV SOLN
INTRAVENOUS | Status: DC | PRN
  Administered 2022-08-15: 4 ug via INTRAVENOUS
  Administered 2022-08-15 (×2): 8 ug via INTRAVENOUS

## 2022-08-15 MED ORDER — MOUNJARO 12.5 MG/0.5ML ~~LOC~~ SOAJ
12.5000 mg | SUBCUTANEOUS | 0 refills | Status: DC
  Filled 2022-08-15 (×3): qty 2, 28d supply, fill #0

## 2022-08-15 MED ORDER — BUPIVACAINE HCL (PF) 0.25 % IJ SOLN
INTRAMUSCULAR | Status: DC | PRN
  Administered 2022-08-15: 20 mL

## 2022-08-15 MED ORDER — OXYCODONE HCL 5 MG PO TABS
5.0000 mg | ORAL_TABLET | Freq: Three times a day (TID) | ORAL | 0 refills | Status: AC | PRN
  Filled 2022-08-15 (×2): qty 15, 5d supply, fill #0

## 2022-08-15 MED ORDER — SUGAMMADEX SODIUM 200 MG/2ML IV SOLN
INTRAVENOUS | Status: DC | PRN
  Administered 2022-08-15: 300 mg via INTRAVENOUS

## 2022-08-15 MED ORDER — FENTANYL CITRATE (PF) 100 MCG/2ML IJ SOLN
INTRAMUSCULAR | Status: DC | PRN
  Administered 2022-08-15: 50 ug via INTRAVENOUS
  Administered 2022-08-15: 100 ug via INTRAVENOUS
  Administered 2022-08-15: 50 ug via INTRAVENOUS

## 2022-08-15 MED ORDER — ONDANSETRON HCL 4 MG/2ML IJ SOLN
INTRAMUSCULAR | Status: AC
  Filled 2022-08-15: qty 2

## 2022-08-15 MED ORDER — ACETAMINOPHEN 500 MG PO TABS
1000.0000 mg | ORAL_TABLET | ORAL | Status: AC
  Administered 2022-08-15: 1000 mg via ORAL
  Filled 2022-08-15: qty 2

## 2022-08-15 MED ORDER — BUPIVACAINE HCL (PF) 0.25 % IJ SOLN
INTRAMUSCULAR | Status: AC
  Filled 2022-08-15: qty 30

## 2022-08-15 MED ORDER — LIDOCAINE 2% (20 MG/ML) 5 ML SYRINGE
INTRAMUSCULAR | Status: DC | PRN
  Administered 2022-08-15: 70 mg via INTRAVENOUS

## 2022-08-15 MED ORDER — CHLORHEXIDINE GLUCONATE 0.12 % MT SOLN
15.0000 mL | Freq: Once | OROMUCOSAL | Status: AC
  Administered 2022-08-15: 15 mL via OROMUCOSAL

## 2022-08-15 MED ORDER — FENTANYL CITRATE PF 50 MCG/ML IJ SOSY
25.0000 ug | PREFILLED_SYRINGE | INTRAMUSCULAR | Status: DC | PRN
  Administered 2022-08-15: 50 ug via INTRAVENOUS

## 2022-08-15 MED ORDER — ORAL CARE MOUTH RINSE: 15.0000 mL | Freq: Once | OROMUCOSAL | Status: AC

## 2022-08-15 MED ORDER — OXYCODONE HCL 5 MG/5ML PO SOLN: 5.0000 mg | Freq: Once | ORAL | Status: AC | PRN

## 2022-08-15 MED ORDER — OXYCODONE HCL 5 MG PO TABS
5.0000 mg | ORAL_TABLET | Freq: Once | ORAL | Status: AC | PRN
  Administered 2022-08-15: 5 mg via ORAL

## 2022-08-15 MED ORDER — ONDANSETRON HCL 4 MG/2ML IJ SOLN: 4.0000 mg | Freq: Four times a day (QID) | INTRAMUSCULAR | Status: DC | PRN

## 2022-08-15 MED ORDER — LACTATED RINGERS IV SOLN: INTRAVENOUS | Status: DC

## 2022-08-15 MED ORDER — LIDOCAINE HCL (PF) 2 % IJ SOLN
INTRAMUSCULAR | Status: AC
  Filled 2022-08-15: qty 5

## 2022-08-15 MED ORDER — INSULIN ASPART 100 UNIT/ML IJ SOLN: 0.0000 [IU] | INTRAMUSCULAR | Status: DC | PRN

## 2022-08-15 MED ORDER — DOCUSATE SODIUM 100 MG PO CAPS
100.0000 mg | ORAL_CAPSULE | Freq: Two times a day (BID) | ORAL | 0 refills | Status: AC
  Filled 2022-08-15 – 2022-09-07 (×4): qty 30, 15d supply, fill #0

## 2022-08-15 MED ORDER — DEXAMETHASONE SODIUM PHOSPHATE 10 MG/ML IJ SOLN
INTRAMUSCULAR | Status: AC
  Filled 2022-08-15: qty 1

## 2022-08-15 MED ORDER — DEXMEDETOMIDINE HCL IN NACL 80 MCG/20ML IV SOLN
INTRAVENOUS | Status: AC
  Filled 2022-08-15: qty 20

## 2022-08-15 MED ORDER — ONDANSETRON HCL 4 MG/2ML IJ SOLN
INTRAMUSCULAR | Status: DC | PRN
  Administered 2022-08-15: 4 mg via INTRAVENOUS

## 2022-08-15 MED ORDER — ROCURONIUM BROMIDE 10 MG/ML (PF) SYRINGE
PREFILLED_SYRINGE | INTRAVENOUS | Status: AC
  Filled 2022-08-15: qty 10

## 2022-08-15 MED ORDER — OXYCODONE HCL 5 MG PO TABS
ORAL_TABLET | ORAL | Status: AC
  Filled 2022-08-15: qty 1

## 2022-08-15 SURGICAL SUPPLY — 33 items
APL PRP STRL LF DISP 70% ISPRP (MISCELLANEOUS) ×1
APL SKNCLS NONHYPOALLERGENIC (GAUZE/BANDAGES/DRESSINGS)
APL SKNCLS STERI-STRIP NONHPOA (GAUZE/BANDAGES/DRESSINGS)
BAG COUNTER SPONGE SURGICOUNT (BAG) IMPLANT
BAG SPNG CNTER NS LX DISP (BAG)
BENZOIN TINCTURE PRP APPL 2/3 (GAUZE/BANDAGES/DRESSINGS) IMPLANT
CHLORAPREP W/TINT 26 (MISCELLANEOUS) ×1 IMPLANT
COVER SURGICAL LIGHT HANDLE (MISCELLANEOUS) ×1 IMPLANT
DRAPE LAPAROSCOPIC ABDOMINAL (DRAPES) ×1 IMPLANT
ELECT REM PT RETURN 15FT ADLT (MISCELLANEOUS) ×1 IMPLANT
GAUZE SPONGE 4X4 12PLY STRL (GAUZE/BANDAGES/DRESSINGS) IMPLANT
GLOVE BIO SURGEON STRL SZ 6 (GLOVE) ×1 IMPLANT
GLOVE INDICATOR 6.5 STRL GRN (GLOVE) ×1 IMPLANT
GLOVE SS BIOGEL STRL SZ 6 (GLOVE) ×1 IMPLANT
GOWN STRL REUS W/ TWL LRG LVL3 (GOWN DISPOSABLE) ×1 IMPLANT
GOWN STRL REUS W/ TWL XL LVL3 (GOWN DISPOSABLE) IMPLANT
GOWN STRL REUS W/TWL LRG LVL3 (GOWN DISPOSABLE) ×1
GOWN STRL REUS W/TWL XL LVL3 (GOWN DISPOSABLE)
KIT BASIN OR (CUSTOM PROCEDURE TRAY) ×1 IMPLANT
KIT TURNOVER KIT A (KITS) IMPLANT
NDL HYPO 22X1.5 SAFETY MO (MISCELLANEOUS) IMPLANT
NEEDLE HYPO 22X1.5 SAFETY MO (MISCELLANEOUS) IMPLANT
PACK GENERAL/GYN (CUSTOM PROCEDURE TRAY) ×1 IMPLANT
SPIKE FLUID TRANSFER (MISCELLANEOUS) ×1 IMPLANT
STRIP CLOSURE SKIN 1/2X4 (GAUZE/BANDAGES/DRESSINGS) IMPLANT
SUT ETHIBOND 0 MO6 C/R (SUTURE) IMPLANT
SUT MNCRL AB 4-0 PS2 18 (SUTURE) ×1 IMPLANT
SUT PROLENE 2 0 CT2 30 (SUTURE) IMPLANT
SUT VIC AB 3-0 SH 27 (SUTURE)
SUT VIC AB 3-0 SH 27XBRD (SUTURE) IMPLANT
SYR CONTROL 10ML LL (SYRINGE) IMPLANT
TOWEL OR 17X26 10 PK STRL BLUE (TOWEL DISPOSABLE) ×1 IMPLANT
TOWEL OR NON WOVEN STRL DISP B (DISPOSABLE) ×1 IMPLANT

## 2022-08-15 NOTE — Transfer of Care (Signed)
Immediate Anesthesia Transfer of Care Note  Patient: Christine Best  Procedure(s) Performed: HERNIA REPAIR UMBILICAL ADULT  Patient Location: PACU  Anesthesia Type:General  Level of Consciousness: awake, alert , and oriented  Airway & Oxygen Therapy: Patient Spontanous Breathing and Patient connected to face mask oxygen  Post-op Assessment: Report given to RN and Post -op Vital signs reviewed and stable  Post vital signs: Reviewed and stable  Last Vitals:  Vitals Value Taken Time  BP 158/94 08/15/22 1054  Temp 36.7 C 08/15/22 1054  Pulse 100 08/15/22 1056  Resp 20 08/15/22 1056  SpO2 100 % 08/15/22 1056  Vitals shown include unvalidated device data.  Last Pain:  Vitals:   08/15/22 0824  TempSrc: Oral  PainSc: 0-No pain         Complications: No notable events documented.

## 2022-08-15 NOTE — Anesthesia Procedure Notes (Signed)
Procedure Name: Intubation Date/Time: 08/15/2022 9:50 AM  Performed by: Pearson Grippe, CRNAPre-anesthesia Checklist: Patient identified, Emergency Drugs available, Suction available and Patient being monitored Patient Re-evaluated:Patient Re-evaluated prior to induction Oxygen Delivery Method: Circle system utilized Preoxygenation: Pre-oxygenation with 100% oxygen Induction Type: IV induction Ventilation: Mask ventilation without difficulty and Oral airway inserted - appropriate to patient size Laryngoscope Size: Hyacinth Meeker and 2 Grade View: Grade II Tube type: Oral Tube size: 7.0 mm Number of attempts: 1 Airway Equipment and Method: Stylet and Oral airway Placement Confirmation: ETT inserted through vocal cords under direct vision, positive ETCO2 and breath sounds checked- equal and bilateral Secured at: 21 cm Tube secured with: Tape Dental Injury: Teeth and Oropharynx as per pre-operative assessment

## 2022-08-15 NOTE — Interval H&P Note (Signed)
History and Physical Interval Note:  08/15/2022 8:40 AM  Christine Best  has presented today for surgery, with the diagnosis of UMBILICAL HERNIA.  The various methods of treatment have been discussed with the patient and family. After consideration of risks, benefits and other options for treatment, the patient has consented to  Procedure(s): HERNIA REPAIR UMBILICAL ADULT WITH MESH (N/A) as a surgical intervention.  The patient's history has been reviewed, patient examined, no change in status, stable for surgery.  I have reviewed the patient's chart and labs.  Questions were answered to the patient's satisfaction.     Delesa Kawa Lollie Sails

## 2022-08-15 NOTE — Discharge Instructions (Signed)
HERNIA REPAIR: POST OP INSTRUCTIONS   EAT Gradually transition to a high fiber diet with a fiber supplement over the next few weeks after discharge.  Start with a pureed / full liquid diet (see below)  WALK Walk an hour a day (cumulative- not all at once).  Control your pain to do that.    CONTROL PAIN Control pain so that you can walk, sleep, tolerate sneezing/coughing, and go up/down stairs.  HAVE A BOWEL MOVEMENT DAILY Keep your bowels regular to avoid problems.  OK to try a laxative to override constipation.  OK to use an antidiarrheal to slow down diarrhea.  Call if not better after 2 tries  CALL IF YOU HAVE PROBLEMS/CONCERNS Call if you are still struggling despite following these instructions. Call if you have concerns not answered by these instructions  ######################################################################    DIET: Follow a light bland diet & liquids the first 24 hours after arrival home, such as soup, liquids, starches, etc.  Be sure to drink plenty of fluids.  Quickly advance to a usual solid diet within a few days.  Avoid fast food or heavy meals initially as you are more likely to get nauseated or have irregular bowels.  A low-sugar, high-fiber diet for the rest of your life is ideal.   Take your usually prescribed home medications unless otherwise directed.  PAIN CONTROL: Pain is best controlled by a usual combination of three different methods TOGETHER: Ice/Heat Over the counter pain medication Prescription pain medication Most patients will experience some swelling and bruising around the hernia(s) such as the bellybutton, groins, or old incisions.  Ice packs or heating pads (30-60 minutes up to 6 times a day) will help. Use ice for the first few days to help decrease swelling and bruising, then switch to heat to help relax tight/sore spots and speed recovery.  Some people prefer to use ice alone, heat alone, alternating between ice & heat.  Experiment  to what works for you.  Swelling and bruising can take several weeks to resolve.   It is helpful to take an over-the-counter pain medication regularly for the first days: Naproxen (Aleve, etc)  Two 220mg tabs twice a day OR Ibuprofen (Advil, etc) Three 200mg tabs four times a day (every meal & bedtime) AND Acetaminophen (Tylenol, etc) 325-650mg four times a day (every meal & bedtime) A  prescription for pain medication should be given to you upon discharge.  Take your pain medication as prescribed, IF NEEDED.  If you are having problems/concerns with the prescription medicine (does not control pain, nausea, vomiting, rash, itching, etc), please call us [phone] to see if we need to switch you to a different pain medicine that will work better for you and/or control your side effect better. If you need a refill on your pain medication, please contact your pharmacy.  They will contact our office to request authorization. Prescriptions will not be filled after 5 pm or on week-ends.  Avoid getting constipated.  Between the surgery and the pain medications, it is common to experience some constipation.  Increasing fluid intake and taking a fiber supplement (such as Metamucil, Citrucel, FiberCon, MiraLax, etc) 1-2 times a day regularly will usually help prevent this problem from occurring.  A mild laxative (prune juice, Milk of Magnesia, MiraLax, etc) should be taken according to package directions if there are no bowel movements after 48 hours.    Wash / shower every day, starting 2 days after surgery.  You may shower over   the steri strips or skin glue which are waterproof.  No rubbing, scrubbing, lotions or ointments to incision(s). Do not soak or submerge.   Remove your outer bandage 2 days after surgery. Steri strips (if present) will peel off after 1-2 weeks. Glue (if present) will flake off after about 2 weeks.  You may leave the incision open to air.  You may replace a dressing/Band-Aid to cover  an incision for comfort if you wish.  Continue to shower over incision(s) after the dressing is off.  ACTIVITIES as tolerated:   You may resume regular (light) daily activities beginning the next day--such as daily self-care, walking, climbing stairs--gradually increasing activities as tolerated.  Control your pain so that you can walk an hour a day.  If you can walk 30 minutes without difficulty, it is safe to try more intense activity such as jogging, treadmill, bicycling, low-impact aerobics, swimming, etc. Refrain from the most intensive and strenuous activity such as sit-ups, heavy lifting, contact sports, etc  Refrain from any heavy lifting or straining until 6 weeks after surgery.   DO NOT PUSH THROUGH PAIN.  Let pain be your guide: If it hurts to do something, don't do it.  Pain is your body warning you to avoid that activity for another week until the pain goes down. You may drive when you are no longer taking prescription pain medication, you can comfortably wear a seatbelt, and you can safely maneuver your car and apply brakes. You may have sexual intercourse when it is comfortable.   FOLLOW UP in our office Please call [facility] at [phone] to set up an appointment to see your surgeon in the office for a follow-up appointment approximately 2-3 weeks after your surgery. Make sure that you call for this appointment the day you arrive home to insure a convenient appointment time.  9.  If you have disability of FMLA / Family leave forms, please bring the forms to the office for processing.  (do not give to your surgeon).  WHEN TO CALL US [phone]: Poor pain control Reactions / problems with new medications (rash/itching, nausea, etc)  Fever over 101.5 F (38.5 C) Inability to urinate Nausea and/or vomiting Worsening swelling or bruising Continued bleeding from incision. Increased pain, redness, or drainage from the incision   The clinic staff is available to answer your  questions during regular business hours (8:30am-5pm).  Please don't hesitate to call and ask to speak to one of our nurses for clinical concerns.   If you have a medical emergency, go to the nearest emergency room or call 911.  A surgeon from [facility] is always on call at the hospitals in [place]  [facility] [address], [address], [place], [place]  [address] ?  [address] [address], [place], [place]   [address] MAIN: [phone] ? TOLL FREE: [phone] ? FAX: [phone] [email or web address]  

## 2022-08-15 NOTE — Op Note (Signed)
Operative Note  Christine Best  [id number]  [id number]  08/15/2022   Surgeon: Phylliss Blakes MD FACS   Assistant: Margarito Courser MD ([id number]) I was personally present during the key and critical portions of this procedure and immediately available throughout the entire procedure, as documented in my operative note.   Procedure performed: umbilical hernia repair    Preop diagnosis: incarcerated umbilical hernia, defect not palpable Post-op diagnosis/intraop findings: Incarcerated umbilical hernia containing preperitoneal fat; defect approximately 9 mm in diameter   Specimens: no Retained items: no  EBL: Minimal cc Complications: none   Description of procedure: After obtaining informed consent the patient was taken to the operating room and placed supine on operating room table where general anesthesia was initiated, preoperative antibiotics were administered, SCDs applied, and a formal timeout was performed.  The abdomen was prepped and draped in usual sterile fashion.  After infiltration with local, an infraumbilical curvilinear incision was made and the soft tissue dissected with cautery until the hernia sac was encountered.  This was further dissected and skeletonized the level of the fascia.  The umbilical stalk was encircled and divided from the fascia with cautery.  The hernia sac was entered in an area that was very thin and then this was excised at the level of the fascia and noted to contain preperitoneal fat.  There was a nodule of preperitoneal fat that was not reducible due to the small size of the fascial defect; this was clamped at its pedicle and excised with cautery; the remainder was suture-ligated with a 3-0 Vicryl.  The fascia was then cleared off and the defect inspected.  This measured about 9 mm in diameter.  This was closed transversely with interrupted 0 Ethibond's.  Additional local was infiltrated in the fascia and surrounding subcutaneous tissue.  The umbilical stalk was  sutured back down to the fascia with a 3-0 Vicryl and then the incision was closed with running subcuticular 4-0 Monocryl.  Benzoin, Steri-Strips and a light pressure dressing of gauze and tape were applied.  The patient was then awakened, extubated and taken to PACU in stable condition.    All counts were correct at the completion of the case.

## 2022-08-15 NOTE — Anesthesia Preprocedure Evaluation (Signed)
Anesthesia Evaluation  Patient identified by MRN, date of birth, ID band Patient awake    Reviewed: Allergy & Precautions, H&P , NPO status , Patient's Chart, lab work & pertinent test results  Airway Mallampati: II   Neck ROM: full    Dental   Pulmonary neg pulmonary ROS   breath sounds clear to auscultation       Cardiovascular hypertension,  Rhythm:regular Rate:Normal     Neuro/Psych    GI/Hepatic   Endo/Other  diabetes, Type 2  Morbid obesity  Renal/GU      Musculoskeletal   Abdominal   Peds  Hematology   Anesthesia Other Findings   Reproductive/Obstetrics                             Anesthesia Physical Anesthesia Plan  ASA: 2  Anesthesia Plan: General   Post-op Pain Management:    Induction: Intravenous  PONV Risk Score and Plan: 3 and Ondansetron, Dexamethasone, Midazolam and Treatment may vary due to age or medical condition  Airway Management Planned: Oral ETT  Additional Equipment:   Intra-op Plan:   Post-operative Plan: Extubation in OR  Informed Consent: I have reviewed the patients History and Physical, chart, labs and discussed the procedure including the risks, benefits and alternatives for the proposed anesthesia with the patient or authorized representative who has indicated his/her understanding and acceptance.     Dental advisory given  Plan Discussed with: CRNA, Anesthesiologist and Surgeon  Anesthesia Plan Comments:        Anesthesia Quick Evaluation

## 2022-08-16 ENCOUNTER — Encounter (HOSPITAL_COMMUNITY): Payer: Self-pay | Admitting: Surgery

## 2022-08-16 NOTE — Anesthesia Postprocedure Evaluation (Signed)
Anesthesia Post Note  Patient: Christine Best  Procedure(s) Performed: HERNIA REPAIR UMBILICAL ADULT     Patient location during evaluation: PACU Anesthesia Type: General Level of consciousness: awake and alert Pain management: pain level controlled Vital Signs Assessment: post-procedure vital signs reviewed and stable Respiratory status: spontaneous breathing, nonlabored ventilation, respiratory function stable and patient connected to nasal cannula oxygen Cardiovascular status: blood pressure returned to baseline and stable Postop Assessment: no apparent nausea or vomiting Anesthetic complications: no   No notable events documented.  Last Vitals:  Vitals:   08/15/22 1147 08/15/22 1200  BP:  (!) 148/91  Pulse: 87 83  Resp:  18  Temp:    SpO2: 97% 95%    Last Pain:  Vitals:   08/15/22 1200  TempSrc:   PainSc: 4                  Marvelous Wool[place] S

## 2022-08-21 ENCOUNTER — Encounter: Payer: Commercial Managed Care - PPO | Admitting: Family Medicine

## 2022-08-27 ENCOUNTER — Other Ambulatory Visit: Payer: Self-pay

## 2022-08-27 ENCOUNTER — Other Ambulatory Visit (HOSPITAL_COMMUNITY): Payer: Self-pay

## 2022-08-28 ENCOUNTER — Other Ambulatory Visit (HOSPITAL_COMMUNITY): Payer: Self-pay

## 2022-08-28 ENCOUNTER — Other Ambulatory Visit: Payer: Self-pay

## 2022-08-28 ENCOUNTER — Encounter: Payer: Self-pay | Admitting: Pharmacist

## 2022-09-03 ENCOUNTER — Telehealth: Payer: Self-pay | Admitting: *Deleted

## 2022-09-03 NOTE — Telephone Encounter (Signed)
[facility] health care form for Continuous glucose monitoring system  Faxed to [phone] Form placed to be scan in patient chart

## 2022-09-05 ENCOUNTER — Other Ambulatory Visit (HOSPITAL_COMMUNITY): Payer: Self-pay

## 2022-09-06 DIAGNOSIS — Z09 Encounter for follow-up examination after completed treatment for conditions other than malignant neoplasm: Secondary | ICD-10-CM | POA: Diagnosis not present

## 2022-09-06 DIAGNOSIS — K420 Umbilical hernia with obstruction, without gangrene: Secondary | ICD-10-CM | POA: Diagnosis not present

## 2022-09-07 ENCOUNTER — Other Ambulatory Visit (HOSPITAL_COMMUNITY): Payer: Self-pay

## 2022-09-07 ENCOUNTER — Other Ambulatory Visit: Payer: Self-pay

## 2022-09-07 DIAGNOSIS — E1165 Type 2 diabetes mellitus with hyperglycemia: Secondary | ICD-10-CM | POA: Diagnosis not present

## 2022-09-07 DIAGNOSIS — Z794 Long term (current) use of insulin: Secondary | ICD-10-CM | POA: Diagnosis not present

## 2022-09-10 ENCOUNTER — Other Ambulatory Visit (HOSPITAL_COMMUNITY): Payer: Self-pay

## 2022-09-11 ENCOUNTER — Other Ambulatory Visit (HOSPITAL_COMMUNITY): Payer: Self-pay

## 2022-09-11 ENCOUNTER — Other Ambulatory Visit: Payer: Self-pay

## 2022-09-14 ENCOUNTER — Other Ambulatory Visit: Payer: Self-pay

## 2022-09-25 ENCOUNTER — Other Ambulatory Visit: Payer: Self-pay | Admitting: Family Medicine

## 2022-09-25 ENCOUNTER — Other Ambulatory Visit (HOSPITAL_COMMUNITY): Payer: Self-pay

## 2022-09-25 ENCOUNTER — Encounter: Payer: Self-pay | Admitting: Family Medicine

## 2022-09-25 ENCOUNTER — Ambulatory Visit: Payer: Commercial Managed Care - PPO | Admitting: Family Medicine

## 2022-09-25 VITALS — BP 133/84 | HR 85 | Temp 97.8°F | Ht 62.0 in | Wt 246.8 lb

## 2022-09-25 DIAGNOSIS — Z6841 Body Mass Index (BMI) 40.0 and over, adult: Secondary | ICD-10-CM | POA: Diagnosis not present

## 2022-09-25 DIAGNOSIS — Z7985 Long-term (current) use of injectable non-insulin antidiabetic drugs: Secondary | ICD-10-CM | POA: Diagnosis not present

## 2022-09-25 DIAGNOSIS — E118 Type 2 diabetes mellitus with unspecified complications: Secondary | ICD-10-CM

## 2022-09-25 DIAGNOSIS — I152 Hypertension secondary to endocrine disorders: Secondary | ICD-10-CM

## 2022-09-25 DIAGNOSIS — E1159 Type 2 diabetes mellitus with other circulatory complications: Secondary | ICD-10-CM | POA: Diagnosis not present

## 2022-09-25 LAB — POCT GLYCOSYLATED HEMOGLOBIN (HGB A1C): Hemoglobin A1C: 5.9 % — AB (ref 4.0–5.6)

## 2022-09-25 MED ORDER — MOUNJARO 15 MG/0.5ML ~~LOC~~ SOAJ
15.0000 mg | SUBCUTANEOUS | 0 refills | Status: DC
  Filled 2022-09-25 – 2022-10-01 (×4): qty 6, 84d supply, fill #0

## 2022-09-25 NOTE — Assessment & Plan Note (Signed)
Down about 7 pounds since her last visit a few months ago.  Hopefully this will continue to improve once Greggory Keen becomes more available.

## 2022-09-25 NOTE — Assessment & Plan Note (Signed)
A1c 5.9.  She is doing well on Mounjaro when it is available.  Hopefully this will become more well over the next 4 months.  Recheck A1c in 3 months at CPE.

## 2022-09-25 NOTE — Assessment & Plan Note (Signed)
Blood pressure at goal on amlodipine 5 mg daily. 

## 2022-09-25 NOTE — Progress Notes (Signed)
   Christine Best is a 47 y.o. female who presents today for an office visit.  Assessment/Plan:  Chronic Problems Addressed Today: Type 2 diabetes mellitus with complication (HCC) A1c 5.9.  She is doing well on Mounjaro when it is available.  Hopefully this will become more well over the next 4 months.  Recheck A1c in 3 months at CPE.  Morbid obesity (HCC) Down about 7 pounds since her last visit a few months ago.  Hopefully this will continue to improve once Greggory Keen becomes more available.  Hypertension associated with diabetes (HCC) Blood pressure at goal on amlodipine 5 mg daily.     Subjective:  HPI:  See A/P for status of chronic conditions.  She is doing well today.  She has had some issues with getting Mounjaro in stock and has been on and off this for the last couple of months.       Objective:  Physical Exam: BP 133/84   Pulse 85   Temp 97.8 F (36.6 C)   Ht 5\' 2"  (1.575 m)   Wt 246 lb 12.8 oz (111.9 kg)   SpO2 95%   BMI 45.14 kg/m   Wt Readings from Last 3 Encounters:  09/25/22 246 lb 12.8 oz (111.9 kg)  08/15/22 245 lb (111.1 kg)  07/31/22 244 lb (110.7 kg)    Gen: No acute distress, resting comfortably Neuro: Grossly normal, moves all extremities Psych: Normal affect and thought content       M. [name], MD 09/25/2022 7:40 AM

## 2022-09-26 ENCOUNTER — Other Ambulatory Visit: Payer: Self-pay

## 2022-09-26 ENCOUNTER — Other Ambulatory Visit (HOSPITAL_COMMUNITY): Payer: Self-pay

## 2022-09-27 ENCOUNTER — Other Ambulatory Visit (HOSPITAL_COMMUNITY): Payer: Self-pay

## 2022-10-01 ENCOUNTER — Other Ambulatory Visit: Payer: Self-pay

## 2022-10-01 ENCOUNTER — Other Ambulatory Visit (HOSPITAL_COMMUNITY): Payer: Self-pay

## 2022-10-17 DIAGNOSIS — Z01411 Encounter for gynecological examination (general) (routine) with abnormal findings: Secondary | ICD-10-CM | POA: Diagnosis not present

## 2022-10-17 DIAGNOSIS — Z1211 Encounter for screening for malignant neoplasm of colon: Secondary | ICD-10-CM | POA: Diagnosis not present

## 2022-10-17 DIAGNOSIS — N920 Excessive and frequent menstruation with regular cycle: Secondary | ICD-10-CM | POA: Diagnosis not present

## 2022-10-17 DIAGNOSIS — D259 Leiomyoma of uterus, unspecified: Secondary | ICD-10-CM | POA: Diagnosis not present

## 2022-10-17 DIAGNOSIS — Z130 Encounter for screening for diseases of the blood and blood-forming organs and certain disorders involving the immune mechanism: Secondary | ICD-10-CM | POA: Diagnosis not present

## 2022-10-17 DIAGNOSIS — Z1389 Encounter for screening for other disorder: Secondary | ICD-10-CM | POA: Diagnosis not present

## 2022-10-29 ENCOUNTER — Other Ambulatory Visit (HOSPITAL_COMMUNITY): Payer: Self-pay

## 2022-11-09 DIAGNOSIS — T8189XA Other complications of procedures, not elsewhere classified, initial encounter: Secondary | ICD-10-CM | POA: Diagnosis not present

## 2022-11-09 DIAGNOSIS — Z1231 Encounter for screening mammogram for malignant neoplasm of breast: Secondary | ICD-10-CM | POA: Diagnosis not present

## 2022-11-09 DIAGNOSIS — N920 Excessive and frequent menstruation with regular cycle: Secondary | ICD-10-CM | POA: Diagnosis not present

## 2022-11-10 ENCOUNTER — Other Ambulatory Visit (HOSPITAL_COMMUNITY): Payer: Self-pay

## 2022-11-10 ENCOUNTER — Ambulatory Visit
Admission: EM | Admit: 2022-11-10 | Discharge: 2022-11-10 | Disposition: A | Discharge status: Home / Self Care | Payer: Commercial Managed Care - PPO | Attending: Internal Medicine | Admitting: Internal Medicine

## 2022-11-10 DIAGNOSIS — J209 Acute bronchitis, unspecified: Secondary | ICD-10-CM

## 2022-11-10 LAB — POCT RAPID STREP A (OFFICE): Rapid Strep A Screen: NEGATIVE

## 2022-11-10 MED ORDER — AZITHROMYCIN 250 MG PO TABS
250.0000 mg | ORAL_TABLET | Freq: Every day | ORAL | 0 refills | Status: DC
Start: 2022-11-13 — End: 2022-12-04

## 2022-11-10 MED ORDER — BENZONATATE 200 MG PO CAPS
200.0000 mg | ORAL_CAPSULE | Freq: Three times a day (TID) | ORAL | 0 refills | Status: DC | PRN
Start: 2022-11-10 — End: 2022-12-04

## 2022-11-10 MED ORDER — TRAMADOL HCL 50 MG PO TABS
50.0000 mg | ORAL_TABLET | ORAL | 0 refills | Status: DC
Start: 2022-11-09 — End: 2022-12-04
  Filled 2022-11-10: qty 6, 1d supply, fill #0
  Filled 2022-11-13: qty 6, 3d supply, fill #0
  Filled 2022-11-13: qty 6, 6d supply, fill #0

## 2022-11-10 MED ORDER — MISOPROSTOL 200 MCG PO TABS
200.0000 ug | ORAL_TABLET | ORAL | 0 refills | Status: DC
Start: 2022-11-09 — End: 2023-01-11
  Filled 2022-11-10 – 2022-11-13 (×3): qty 2, 1d supply, fill #0

## 2022-11-10 NOTE — ED Provider Notes (Addendum)
[facility]    CSN: [id number] Arrival date & time: 11/10/22  1006      History   Chief Complaint Chief Complaint  Patient presents with   Sore Throat    HPI Christine Best is a 47 y.o. female  presents for evaluation of URI symptoms for 7 days. Patient reports associated symptoms of cough and sore throat with some posttussive vomiting. Denies N/V/D, fevers, ear pain, body aches, shortness of breath. Patient does not have a hx of asthma. Patient does not have a history of smoking.  Reports daughter has had similar symptoms.  Pt has taken ibuprofen OTC for symptoms. Pt has no other concerns at this time.    Sore Throat    Past Medical History:  Diagnosis Date   Anemia    Chronic constipation    Delivery with history of C-section 08/03/2019   History of MRSA infection    HTN (hypertension)    followed by pcp   Infertility, female    Pelvic adhesions    Type 2 diabetes mellitus (HCC)    followed by pcp;   fasting sugar-- 130   (does not check blood sugar on regular basis)   Uterine fibroid    Wears contact lenses     Patient Active Problem List   Diagnosis Date Noted   Iron deficiency 05/22/2022   Dysmenorrhea 03/02/2022   Umbilical hernia 03/02/2022   Shortness of breath on exertion 09/03/2017   Type 2 diabetes mellitus with complication (HCC) 04/03/2017   Morbid obesity (HCC) 04/03/2017   Hypertension associated with diabetes (HCC) 04/03/2017    Past Surgical History:  Procedure Laterality Date   ADENOIDECTOMY  age 14   CESAREAN SECTION N/A 08/03/2019   Procedure: CESAREAN SECTION;  Surgeon: [name], DO;  Location: [facility];  Service: Obstetrics;  Laterality: N/A;  Tracey RNFA   HYSTEROSCOPY N/A 11/07/2018   Procedure: HYSTEROSCOPY, endometrial biopsy;  Surgeon: Fermin Schwab, MD;  Location: [facility];  Service: Gynecology;  Laterality: N/A;   ROBOT ASSISTED MYOMECTOMY N/A 03/26/2018   Procedure: XI ROBOTIC ASSISTED  LAPAROSCOPIC MYOMECTOMY WITH TRANSVAGINAL ULTRASOUND GUIDANCE AND EXCISION OF ENDOMETRIOSIS;  Surgeon: Fermin Schwab, MD;  Location: [facility];  Service: Gynecology;  Laterality: N/A;   ROBOT ASSISTED MYOMECTOMY  01-24-2010   dr Tamela Oddi @[facility]    and [facility] HYSTEROSCOPY   UMBILICAL HERNIA REPAIR N/A 08/15/2022   Procedure: HERNIA REPAIR UMBILICAL ADULT;  Surgeon: Berna Bue, MD;  Location: [facility];  Service: General;  Laterality: N/A;    OB History     Gravida  1   Para  1   Term  1   Preterm  0   AB  0   Living  1      SAB  0   IAB  0   Ectopic  0   Multiple  0   Live Births  1            Home Medications    Prior to Admission medications   Medication Sig Start Date End Date Taking? Authorizing Provider  azithromycin (ZITHROMAX) 250 MG tablet Take 1 tablet (250 mg total) by mouth daily. Take first 2 tablets together, then 1 every day until finished. 11/13/22  Yes Radford Pax, NP  benzonatate (TESSALON) 200 MG capsule Take 1 capsule (200 mg total) by mouth 3 (three) times daily as needed. 11/10/22  Yes Radford Pax, NP  amLODipine (NORVASC) 5 MG tablet  Take 1 tablet (5 mg total) by mouth daily. 05/22/22   Ardith Dark, MD  Continuous Blood Gluc Receiver (DEXCOM G6 RECEIVER) DEVI Use daily as needed to check blood sugar. 09/20/21   Ardith Dark, MD  Continuous Blood Gluc Sensor (DEXCOM G6 SENSOR) MISC Use daily as needed to check blood sugar. 09/20/21   Ardith Dark, MD  Iron, Ferrous Sulfate, 325 (65 Fe) MG TABS Take 1 tablet by mouth every other day. 05/22/22   Ardith Dark, MD  Norgestimate-Ethinyl Estradiol Triphasic (ORTHO TRI-CYCLEN LO) 0.18/0.215/0.25 MG-25 MCG tab Take 1 tablet by mouth daily. 03/02/22   Ardith Dark, MD  tirzepatide [facility]) 15 MG/0.5ML Pen Inject 15 mg into the skin once a week. 09/25/22   Ardith Dark, MD    Family History Family History  Problem Relation Age of Onset   Diabetes Mother     Hypertension Mother    Cancer Father    Diabetes Brother    Cancer Maternal Aunt        type unknown    Social History Social History   Tobacco Use   Smoking status: Never   Smokeless tobacco: Never  Vaping Use   Vaping status: Never Used  Substance Use Topics   Alcohol use: Yes    Alcohol/week: 0.0 standard drinks of alcohol    Comment: occasional   Drug use: No     Allergies   Lisinopril   Review of Systems Review of Systems  HENT:  Positive for sore throat.   Respiratory:  Positive for cough.      Physical Exam Triage Vital Signs ED Triage Vitals [11/10/22 1013]  Encounter Vitals Group     BP (!) 168/108     Systolic BP Percentile      Diastolic BP Percentile      Pulse Rate 86     Resp 16     Temp 97.9 F (36.6 C)     Temp Source Oral     SpO2 97 %     Weight      Height      Head Circumference      Peak Flow      Pain Score 3     Pain Loc      Pain Education      Exclude from Growth Chart    No data found.  Updated Vital Signs BP (!) 168/108 (BP Location: Left Arm)   Pulse 86   Temp 97.9 F (36.6 C) (Oral)   Resp 16   LMP 10/10/2022 (Approximate)   SpO2 97%   Breastfeeding No   Visual Acuity Right Eye Distance:   Left Eye Distance:   Bilateral Distance:    Right Eye Near:   Left Eye Near:    Bilateral Near:     Physical Exam Vitals and nursing note reviewed.  Constitutional:      General: She is not in acute distress.    Appearance: She is well-developed. She is not ill-appearing.  HENT:     Head: Normocephalic and atraumatic.     Right Ear: Tympanic membrane and ear canal normal.     Left Ear: Tympanic membrane and ear canal normal.     Nose: No congestion.     Mouth/Throat:     Mouth: Mucous membranes are moist.     Pharynx: Oropharynx is clear. Uvula midline. Posterior oropharyngeal erythema present.     Tonsils: No tonsillar exudate or tonsillar abscesses.  Eyes:  Conjunctiva/sclera: Conjunctivae normal.      Pupils: Pupils are equal, round, and reactive to light.  Cardiovascular:     Rate and Rhythm: Normal rate and regular rhythm.     Heart sounds: Normal heart sounds.  Pulmonary:     Effort: Pulmonary effort is normal.     Breath sounds: Normal breath sounds.  Musculoskeletal:     Cervical back: Normal range of motion and neck supple.  Lymphadenopathy:     Cervical: No cervical adenopathy.  Skin:    General: Skin is warm and dry.  Neurological:     General: No focal deficit present.     Mental Status: She is alert and oriented to person, place, and time.  Psychiatric:        Mood and Affect: Mood normal.        Behavior: Behavior normal.      UC Treatments / Results  Labs (all labs ordered are listed, but only abnormal results are displayed) Labs Reviewed  POCT RAPID STREP A (OFFICE)    EKG   Radiology No results found.  Procedures Procedures (including critical care time)  Medications Ordered in UC Medications - No data to display  Initial Impression / Assessment and Plan / UC Course  I have reviewed the triage vital signs and the nursing notes.  Pertinent labs & imaging results that were available during my care of the patient were reviewed by me and considered in my medical decision making (see chart for details).     Reviewed exam and symptoms with patient.  No red flags.  Negative rapid strep.  Discussed bronchitis.  Tessalon as needed for cough.  Provisional prescription for Zithromax provided with instruction not to take unless symptoms do not improve or worsen within 3 days and patient verbalized understanding.  PCP follow-up 2 days for recheck.  ER precautions reviewed and patient verbalized understanding. Final Clinical Impressions(s) / UC Diagnoses   Final diagnoses:  Acute bronchitis, unspecified organism     Discharge Instructions      Start Tessalon as needed for cough.  A provisional prescription for Zithromax has been provided.  Patient  started that your symptoms do not improve or worsen over the next 3 days.  Lots of rest and fluids.  Follow-up with your PCP in 2 days for recheck.  Please go to the ER if you develop any worsening symptoms.  I hope you feel better soon!    ED Prescriptions     Medication Sig Dispense Auth. Provider   benzonatate (TESSALON) 200 MG capsule Take 1 capsule (200 mg total) by mouth 3 (three) times daily as needed. 20 capsule Radford Pax, NP   azithromycin (ZITHROMAX) 250 MG tablet Take 1 tablet (250 mg total) by mouth daily. Take first 2 tablets together, then 1 every day until finished. 6 tablet Radford Pax, NP      PDMP not reviewed this encounter.   Radford Pax, NP 11/10/22 1033    Radford Pax, NP 11/10/22 [phone]

## 2022-11-10 NOTE — Discharge Instructions (Signed)
Start Tessalon as needed for cough.  A provisional prescription for Zithromax has been provided.  Patient started that your symptoms do not improve or worsen over the next 3 days.  Lots of rest and fluids.  Follow-up with your PCP in 2 days for recheck.  Please go to the ER if you develop any worsening symptoms.  I hope you feel better soon!

## 2022-11-10 NOTE — ED Triage Notes (Signed)
Pt states sore throat for a week with cough.

## 2022-11-12 ENCOUNTER — Other Ambulatory Visit (HOSPITAL_COMMUNITY): Payer: Self-pay

## 2022-11-13 ENCOUNTER — Encounter: Payer: Self-pay | Admitting: Pharmacist

## 2022-11-13 ENCOUNTER — Other Ambulatory Visit: Payer: Self-pay

## 2022-11-13 ENCOUNTER — Other Ambulatory Visit (HOSPITAL_COMMUNITY): Payer: Self-pay

## 2022-11-14 ENCOUNTER — Other Ambulatory Visit (HOSPITAL_COMMUNITY): Payer: Self-pay

## 2022-11-15 ENCOUNTER — Other Ambulatory Visit (HOSPITAL_COMMUNITY): Payer: Self-pay

## 2022-11-15 MED ORDER — BENZONATATE 200 MG PO CAPS: 200.0000 mg | ORAL_CAPSULE | Freq: Three times a day (TID) | ORAL | 0 refills | Status: DC | PRN

## 2022-11-15 MED ORDER — AZITHROMYCIN 250 MG PO TABS: ORAL_TABLET | ORAL | 0 refills | Status: DC

## 2022-11-21 ENCOUNTER — Other Ambulatory Visit: Payer: Self-pay

## 2022-11-29 ENCOUNTER — Encounter: Payer: Self-pay | Admitting: Gastroenterology

## 2022-12-03 DIAGNOSIS — Z794 Long term (current) use of insulin: Secondary | ICD-10-CM | POA: Diagnosis not present

## 2022-12-03 DIAGNOSIS — E1165 Type 2 diabetes mellitus with hyperglycemia: Secondary | ICD-10-CM | POA: Diagnosis not present

## 2022-12-04 ENCOUNTER — Other Ambulatory Visit (HOSPITAL_COMMUNITY): Payer: Self-pay

## 2022-12-04 ENCOUNTER — Other Ambulatory Visit: Payer: Self-pay

## 2022-12-04 ENCOUNTER — Ambulatory Visit (INDEPENDENT_AMBULATORY_CARE_PROVIDER_SITE_OTHER): Payer: Commercial Managed Care - PPO | Admitting: Family Medicine

## 2022-12-04 ENCOUNTER — Encounter: Payer: Self-pay | Admitting: Family Medicine

## 2022-12-04 ENCOUNTER — Other Ambulatory Visit: Payer: Self-pay | Admitting: *Deleted

## 2022-12-04 ENCOUNTER — Telehealth: Payer: Self-pay | Admitting: Family Medicine

## 2022-12-04 VITALS — BP 156/86 | HR 76 | Temp 97.8°F | Ht 62.0 in | Wt 248.8 lb

## 2022-12-04 DIAGNOSIS — Z0000 Encounter for general adult medical examination without abnormal findings: Secondary | ICD-10-CM

## 2022-12-04 DIAGNOSIS — E118 Type 2 diabetes mellitus with unspecified complications: Secondary | ICD-10-CM | POA: Diagnosis not present

## 2022-12-04 DIAGNOSIS — Z7985 Long-term (current) use of injectable non-insulin antidiabetic drugs: Secondary | ICD-10-CM

## 2022-12-04 DIAGNOSIS — E1159 Type 2 diabetes mellitus with other circulatory complications: Secondary | ICD-10-CM | POA: Diagnosis not present

## 2022-12-04 DIAGNOSIS — E611 Iron deficiency: Secondary | ICD-10-CM

## 2022-12-04 DIAGNOSIS — I152 Hypertension secondary to endocrine disorders: Secondary | ICD-10-CM | POA: Diagnosis not present

## 2022-12-04 DIAGNOSIS — N946 Dysmenorrhea, unspecified: Secondary | ICD-10-CM | POA: Diagnosis not present

## 2022-12-04 LAB — LIPID PANEL
Cholesterol: 186 mg/dL (ref 0–200)
HDL: 59.2 mg/dL (ref >39.00)
LDL Cholesterol: 106 mg/dL — ABNORMAL HIGH (ref 0–99)
NonHDL: 126.5
Total CHOL/HDL Ratio: 3
Triglycerides: 105 mg/dL (ref 0.0–149.0)
VLDL: 21 mg/dL (ref 0.0–40.0)

## 2022-12-04 LAB — CBC
HCT: 34.1 % — ABNORMAL LOW (ref 36.0–46.0)
Hemoglobin: 10.3 g/dL — ABNORMAL LOW (ref 12.0–15.0)
MCHC: 30.2 g/dL (ref 30.0–36.0)
MCV: 68.6 fL — ABNORMAL LOW (ref 78.0–100.0)
Platelets: 407 10*3/uL — ABNORMAL HIGH (ref 150.0–400.0)
RBC: 4.97 Mil/uL (ref 3.87–5.11)
RDW: 18.3 % — ABNORMAL HIGH (ref 11.5–15.5)
WBC: 6.1 10*3/uL (ref 4.0–10.5)

## 2022-12-04 LAB — COMPREHENSIVE METABOLIC PANEL
ALT: 7 U/L (ref 0–35)
AST: 10 U/L (ref 0–37)
Albumin: 3.7 g/dL (ref 3.5–5.2)
Alkaline Phosphatase: 73 U/L (ref 39–117)
BUN: 8 mg/dL (ref 6–23)
CO2: 24 meq/L (ref 19–32)
Calcium: 8.9 mg/dL (ref 8.4–10.5)
Chloride: 105 meq/L (ref 96–112)
Creatinine, Ser: 0.68 mg/dL (ref 0.40–1.20)
GFR: 105.42 mL/min (ref >60.00)
Glucose, Bld: 89 mg/dL (ref 70–99)
Potassium: 3.4 meq/L — ABNORMAL LOW (ref 3.5–5.1)
Sodium: 137 meq/L (ref 135–145)
Total Bilirubin: 0.5 mg/dL (ref 0.2–1.2)
Total Protein: 7.5 g/dL (ref 6.0–8.3)

## 2022-12-04 LAB — TSH: TSH: 2.47 u[IU]/mL (ref 0.35–5.50)

## 2022-12-04 LAB — HEMOGLOBIN A1C: Hgb A1c MFr Bld: 6.4 % (ref 4.6–6.5)

## 2022-12-04 LAB — MICROALBUMIN / CREATININE URINE RATIO
Creatinine,U: 347.1 mg/dL
Microalb Creat Ratio: 0.7 mg/g (ref 0.0–30.0)
Microalb, Ur: 2.5 mg/dL — ABNORMAL HIGH (ref 0.0–1.9)

## 2022-12-04 MED ORDER — OZEMPIC (2 MG/DOSE) 8 MG/3ML ~~LOC~~ SOPN
2.0000 mg | PEN_INJECTOR | SUBCUTANEOUS | 3 refills | Status: DC
  Filled 2022-12-04: qty 3, 28d supply, fill #0
  Filled 2022-12-13 – 2022-12-25 (×3): qty 3, 28d supply, fill #1
  Filled 2023-01-30: qty 3, 28d supply, fill #2
  Filled 2023-02-22 (×2): qty 3, 28d supply, fill #3
  Filled 2023-03-08 – 2023-03-18 (×3): qty 3, 28d supply, fill #4
  Filled 2023-04-19: qty 3, 28d supply, fill #0
  Filled 2023-05-12 – 2023-05-20 (×3): qty 3, 28d supply, fill #1
  Filled 2023-06-17: qty 3, 28d supply, fill #2
  Filled 2023-07-16: qty 3, 28d supply, fill #3
  Filled 2023-08-12: qty 3, 28d supply, fill #4
  Filled 2023-09-04: qty 3, 28d supply, fill #5
  Filled 2023-10-01: qty 3, 28d supply, fill #6

## 2022-12-04 NOTE — Assessment & Plan Note (Signed)
Patient up about 3 pounds since her last visit.  Will be switching back to the Ozempic as she felt like this worked better for her than the [employer].  Would consider switching to Wegovy at the beginning of the calendar year if insurance will pay for this.

## 2022-12-04 NOTE — Assessment & Plan Note (Signed)
Elevated today though she has not taken medications.  Typically well-controlled on amlodipine 5 mg daily.  She will continue this and let us know if persistently elevated.

## 2022-12-04 NOTE — Patient Instructions (Signed)
It was very nice to see you today!  We will switch back to Ozempic.  Please continue to work on diet and exercise.  Please keep an eye on your blood pressure and let us know if it is persistently elevated.  We will probably see back in 3 to 6 months to recheck your A1c the pending blood work today.  I will see back in year for your next physical.  Return in about 1 year (around 12/04/2023) for Annual Physical.   Take care, Dr [name]  PLEASE NOTE:  If you had any lab tests, please let us know if you have not heard back within a few days. You may see your results on mychart before we have a chance to review them but we will give you a call once they are reviewed by [place].   If we ordered any referrals today, please let us know if you have not heard from their office within the next week.   If you had any urgent prescriptions sent in today, please check with the pharmacy within an hour of our visit to make sure the prescription was transmitted appropriately.   Please try these tips to maintain a healthy lifestyle:  Eat at least 3 REAL meals and 1-2 snacks per day.  Aim for no more than 5 hours between eating.  If you eat breakfast, please do so within one hour of getting up.   Each meal should contain half fruits/vegetables, one quarter protein, and one quarter carbs (no bigger than a computer mouse)  Cut down on sweet beverages. This includes juice, soda, and sweet tea.   Drink at least 1 glass of water with each meal and aim for at least 8 glasses per day  Exercise at least 150 minutes every week.    Preventive Care 30-40 Years Old, Female Preventive care refers to lifestyle choices and visits with your health care provider that can promote health and wellness. Preventive care visits are also called wellness exams. What can I expect for my preventive care visit? Counseling Your health care provider may ask you questions about your: Medical history, including: Past medical  problems. Family medical history. Pregnancy history. Current health, including: Menstrual cycle. Method of birth control. Emotional well-being. Home life and relationship well-being. Sexual activity and sexual health. Lifestyle, including: Alcohol, nicotine or tobacco, and drug use. Access to firearms. Diet, exercise, and sleep habits. Work and work Astronomer. Sunscreen use. Safety issues such as seatbelt and bike helmet use. Physical exam Your health care provider will check your: Height and weight. These may be used to calculate your BMI (body mass index). BMI is a measurement that tells if you are at a healthy weight. Waist circumference. This measures the distance around your waistline. This measurement also tells if you are at a healthy weight and may help predict your risk of certain diseases, such as type 2 diabetes and high blood pressure. Heart rate and blood pressure. Body temperature. Skin for abnormal spots. What immunizations do I need?  Vaccines are usually given at various ages, according to a schedule. Your health care provider will recommend vaccines for you based on your age, medical history, and lifestyle or other factors, such as travel or where you work. What tests do I need? Screening Your health care provider may recommend screening tests for certain conditions. This may include: Lipid and cholesterol levels. Diabetes screening. This is done by checking your blood sugar (glucose) after you have not eaten for a while (fasting).  Pelvic exam and Pap test. Hepatitis B test. Hepatitis C test. HIV (human immunodeficiency virus) test. STI (sexually transmitted infection) testing, if you are at risk. Lung cancer screening. Colorectal cancer screening. Mammogram. Talk with your health care provider about when you should start having regular mammograms. This may depend on whether you have a family history of breast cancer. BRCA-related cancer screening. This may  be done if you have a family history of breast, ovarian, tubal, or peritoneal cancers. Bone density scan. This is done to screen for osteoporosis. Talk with your health care provider about your test results, treatment options, and if necessary, the need for more tests. Follow these instructions at home: Eating and drinking  Eat a diet that includes fresh fruits and vegetables, whole grains, lean protein, and low-fat dairy products. Take vitamin and mineral supplements as recommended by your health care provider. Do not drink alcohol if: Your health care provider tells you not to drink. You are pregnant, may be pregnant, or are planning to become pregnant. If you drink alcohol: Limit how much you have to 0-1 drink a day. Know how much alcohol is in your drink. In the [place] one drink equals one 12 oz bottle of beer (355 mL), one 5 oz glass of wine (148 mL), or one 1 oz glass of hard liquor (44 mL). Lifestyle Brush your teeth every morning and night with fluoride toothpaste. Floss one time each day. Exercise for at least 30 minutes 5 or more days each week. Do not use any products that contain nicotine or tobacco. These products include cigarettes, chewing tobacco, and vaping devices, such as e-cigarettes. If you need help quitting, ask your health care provider. Do not use drugs. If you are sexually active, practice safe sex. Use a condom or other form of protection to prevent STIs. If you do not wish to become pregnant, use a form of birth control. If you plan to become pregnant, see your health care provider for a prepregnancy visit. Take aspirin only as told by your health care provider. Make sure that you understand how much to take and what form to take. Work with your health care provider to find out whether it is safe and beneficial for you to take aspirin daily. Find healthy ways to manage stress, such as: Meditation, yoga, or listening to music. Journaling. Talking to a trusted  person. Spending time with friends and family. Minimize exposure to UV radiation to reduce your risk of skin cancer. Safety Always wear your seat belt while driving or riding in a vehicle. Do not drive: If you have been drinking alcohol. Do not ride with someone who has been drinking. When you are tired or distracted. While texting. If you have been using any mind-altering substances or drugs. Wear a helmet and other protective equipment during sports activities. If you have firearms in your house, make sure you follow all gun safety procedures. Seek help if you have been physically or sexually abused. What's next? Visit your health care provider once a year for an annual wellness visit. Ask your health care provider how often you should have your eyes and teeth checked. Stay up to date on all vaccines. This information is not intended to replace advice given to you by your health care provider. Make sure you discuss any questions you have with your health care provider. Document Revised: 08/03/2020 Document Reviewed: 08/03/2020 Elsevier Patient Education  2024 [employer].

## 2022-12-04 NOTE — Assessment & Plan Note (Signed)
Following with GYN.  Has upcoming hysterectomy later this year.

## 2022-12-04 NOTE — Telephone Encounter (Signed)
Error

## 2022-12-04 NOTE — Assessment & Plan Note (Signed)
Check A1c.  Will go back to Ozempic as she felt like this is working better.  Recheck A1c in 3 to 6 months.

## 2022-12-04 NOTE — Progress Notes (Signed)
Chief Complaint:  Christine Best is a 47 y.o. female who presents today for her annual comprehensive physical exam.    Assessment/Plan:  Chronic Problems Addressed Today: Type 2 diabetes mellitus with complication (HCC) Check A1c.  Will go back to Ozempic as she felt like this is working better.  Recheck A1c in 3 to 6 months.  Hypertension associated with diabetes (HCC) Elevated today though she has not taken medications.  Typically well-controlled on amlodipine 5 mg daily.  She will continue this and let us know if persistently elevated.  Dysmenorrhea Following with GYN.  Has upcoming hysterectomy later this year.  Morbid obesity (HCC) Patient up about 3 pounds since her last visit.  Will be switching back to the Ozempic as she felt like this worked better for her than the [employer].  Would consider switching to Wegovy at the beginning of the calendar year if insurance will pay for this.   Preventative Healthcare: Check labs.  Up-to-date on vaccines.  Due for colonoscopy later this year.  Patient Counseling(The following topics were reviewed and/or handout was given):  -Nutrition: Stressed importance of moderation in sodium/caffeine intake, saturated fat and cholesterol, caloric balance, sufficient intake of fresh fruits, vegetables, and fiber.  -Stressed the importance of regular exercise.   -Substance Abuse: Discussed cessation/primary prevention of tobacco, alcohol, or other drug use; driving or other dangerous activities under the influence; availability of treatment for abuse.   -Injury prevention: Discussed safety belts, safety helmets, smoke detector, smoking near bedding or upholstery.   -Sexuality: Discussed sexually transmitted diseases, partner selection, use of condoms, avoidance of unintended pregnancy and contraceptive alternatives.   -Dental health: Discussed importance of regular tooth brushing, flossing, and dental visits.  -Health maintenance and immunizations  reviewed. Please refer to Health maintenance section.  Return to care in 1 year for next preventative visit.     Subjective:  HPI:  She has no acute complaints today.  See A/P for status of chronic conditions.  She would like to go back to Ozempic.  She is now [facility] but does not feel like it has been as effective as the Ozempic in terms of managing her appetite and weight.  Lifestyle Diet: Limited.  Exercise: Limited.      09/25/2022    7:30 AM  Depression screen PHQ 2/9  Decreased Interest 0  Down, Depressed, Hopeless 0  PHQ - 2 Score 0    Health Maintenance Due  Topic Date Due   Diabetic kidney evaluation - Urine ACR  07/06/2022   FOOT EXAM  07/06/2022   Colonoscopy  Never done     ROS: Per HPI, otherwise a complete review of systems was negative.   PMH:  The following were reviewed and entered/updated in epic: Past Medical History:  Diagnosis Date   Anemia    Chronic constipation    Delivery with history of C-section 08/03/2019   History of MRSA infection    HTN (hypertension)    followed by pcp   Infertility, female    Pelvic adhesions    Type 2 diabetes mellitus (HCC)    followed by pcp;   fasting sugar-- 130   (does not check blood sugar on regular basis)   Uterine fibroid    Wears contact lenses    Patient Active Problem List   Diagnosis Date Noted   Iron deficiency 05/22/2022   Dysmenorrhea 03/02/2022   Umbilical hernia 03/02/2022   Shortness of breath on exertion 09/03/2017   Type 2 diabetes mellitus with  complication (HCC) 04/03/2017   Morbid obesity (HCC) 04/03/2017   Hypertension associated with diabetes (HCC) 04/03/2017   Past Surgical History:  Procedure Laterality Date   ADENOIDECTOMY  age 40   CESAREAN SECTION N/A 08/03/2019   Procedure: CESAREAN SECTION;  Surgeon: [name], DO;  Location: [facility];  Service: Obstetrics;  Laterality: N/A;  Tracey RNFA   HYSTEROSCOPY N/A 11/07/2018   Procedure: HYSTEROSCOPY, endometrial  biopsy;  Surgeon: Fermin Schwab, MD;  Location: [facility];  Service: Gynecology;  Laterality: N/A;   ROBOT ASSISTED MYOMECTOMY N/A 03/26/2018   Procedure: XI ROBOTIC ASSISTED LAPAROSCOPIC MYOMECTOMY WITH TRANSVAGINAL ULTRASOUND GUIDANCE AND EXCISION OF ENDOMETRIOSIS;  Surgeon: Fermin Schwab, MD;  Location: [facility];  Service: Gynecology;  Laterality: N/A;   ROBOT ASSISTED MYOMECTOMY  01-24-2010   dr Tamela Oddi @[facility]    and [facility] HYSTEROSCOPY   UMBILICAL HERNIA REPAIR N/A 08/15/2022   Procedure: HERNIA REPAIR UMBILICAL ADULT;  Surgeon: Berna Bue, MD;  Location: [facility];  Service: General;  Laterality: N/A;    Family History  Problem Relation Age of Onset   Diabetes Mother    Hypertension Mother    Cancer Father    Diabetes Brother    Cancer Maternal Aunt        type unknown    Medications- reviewed and updated Current Outpatient Medications  Medication Sig Dispense Refill   amLODipine (NORVASC) 5 MG tablet Take 1 tablet (5 mg total) by mouth daily. 90 tablet 3   Continuous Blood Gluc Receiver (DEXCOM G6 RECEIVER) DEVI Use daily as needed to check blood sugar. 1 each 5   Continuous Blood Gluc Sensor (DEXCOM G6 SENSOR) MISC Use daily as needed to check blood sugar. 3 each 5   misoprostol (CYTOTEC) 200 MCG tablet Take 1 tablet (200 mcg total) by mouth every 4 (four) hours as directed for 1 day prior to appointment.  **For am prior to appointment** 2 tablet 0   Norgestimate-Ethinyl Estradiol Triphasic (ORTHO TRI-CYCLEN LO) 0.18/0.215/0.25 MG-25 MCG tab Take 1 tablet by mouth daily. 28 tablet 11   Semaglutide,0.25 or 0.5MG /DOS, (OZEMPIC, 0.25 OR 0.5 MG/DOSE,) 2 MG/3ML SOPN Inject 2 mg into the skin once a week. 9 mL 3   tirzepatide (MOUNJARO) 15 MG/0.5ML Pen Inject 15 mg into the skin once a week. 6 mL 0   No current facility-administered medications for this visit.    Allergies-reviewed and updated Allergies  Allergen Reactions   Lisinopril  Swelling and Other (See Comments)    angioedema    Social History   Socioeconomic History   Marital status: Single    Spouse name: Not on file   Number of children: 0   Years of education: Not on file   Highest education level: Master's degree (e.g., MA, MS, MEng, MEd, MSW, MBA)  Occupational History   Occupation: Freight forwarder  Tobacco Use   Smoking status: Never   Smokeless tobacco: Never  Vaping Use   Vaping status: Never Used  Substance and Sexual Activity   Alcohol use: Yes    Alcohol/week: 0.0 standard drinks of alcohol    Comment: occasional   Drug use: No   Sexual activity: Yes    Birth control/protection: None  Other Topics Concern   Not on file  Social History Narrative   Not on file   Social Determinants of Health   Financial Resource Strain: Low Risk  (05/21/2022)   Overall Financial Resource Strain (CARDIA)    Difficulty of Paying Living  Expenses: Not hard at all  Food Insecurity: No Food Insecurity (05/21/2022)   Hunger Vital Sign    Worried About Running Out of Food in the Last Year: Never true    Ran Out of Food in the Last Year: Never true  Transportation Needs: No Transportation Needs (05/21/2022)   PRAPARE - Administrator, Civil Service (Medical): No    Lack of Transportation (Non-Medical): No  Physical Activity: Unknown (05/21/2022)   Exercise Vital Sign    Days of Exercise per Week: 0 days    Minutes of Exercise per Session: Not on file  Stress: No Stress Concern Present (05/21/2022)   [employer] of Occupational Health - Occupational Stress Questionnaire    Feeling of Stress : Not at all  Social Connections: Moderately Integrated (05/21/2022)   Social Connection and Isolation Panel [NHANES]    Frequency of Communication with Friends and Family: More than three times a week    Frequency of Social Gatherings with Friends and Family: Once a week    Attends Religious Services: 1 to 4 times per year    Active Member of [employer] or  Organizations: No    Attends Engineer, structural: Not on file    Marital Status: Living with partner        Objective:  Physical Exam: BP (!) 156/86   Pulse 76   Temp 97.8 F (36.6 C)   Ht 5\' 2"  (1.575 m)   Wt 248 lb 12.8 oz (112.9 kg)   LMP 10/10/2022 (Approximate)   SpO2 97%   BMI 45.51 kg/m   Body mass index is 45.51 kg/m. Wt Readings from Last 3 Encounters:  12/04/22 248 lb 12.8 oz (112.9 kg)  09/25/22 246 lb 12.8 oz (111.9 kg)  08/15/22 245 lb (111.1 kg)   Gen: NAD, resting comfortably HEENT: TMs normal bilaterally. OP clear. No thyromegaly noted.  CV: RRR with no murmurs appreciated Pulm: NWOB, CTAB with no crackles, wheezes, or rhonchi GI: Normal bowel sounds present. Soft, Nontender, Nondistended. MSK: no edema, cyanosis, or clubbing noted Skin: warm, dry Neuro: CN2-12 grossly intact. Strength 5/5 in upper and lower extremities. Reflexes symmetric and intact bilaterally.  Psych: Normal affect and thought content     Ilina Xu M. [name], MD 12/04/2022 8:32 AM

## 2022-12-05 ENCOUNTER — Other Ambulatory Visit (HOSPITAL_COMMUNITY): Payer: Self-pay

## 2022-12-06 NOTE — Progress Notes (Signed)
Her A1c is up a little bit compared to last time but still at goal.  Hopefully this will better controlled as we switch to Ozempic as we discussed at her office visit.  She is anemic but stable compared to previous.  She should continue taking iron supplementation if she is able to.  Her cholesterol levels are up a bit since last year.  Do not need to start meds for this but she should work on diet and exercise and we can recheck this in about a year or so.

## 2022-12-13 ENCOUNTER — Other Ambulatory Visit (HOSPITAL_COMMUNITY): Payer: Self-pay

## 2022-12-13 ENCOUNTER — Other Ambulatory Visit: Payer: Self-pay

## 2022-12-14 ENCOUNTER — Other Ambulatory Visit (HOSPITAL_COMMUNITY): Payer: Self-pay

## 2022-12-14 DIAGNOSIS — N840 Polyp of corpus uteri: Secondary | ICD-10-CM | POA: Diagnosis not present

## 2022-12-14 DIAGNOSIS — N920 Excessive and frequent menstruation with regular cycle: Secondary | ICD-10-CM | POA: Diagnosis not present

## 2022-12-14 DIAGNOSIS — N898 Other specified noninflammatory disorders of vagina: Secondary | ICD-10-CM | POA: Diagnosis not present

## 2022-12-17 ENCOUNTER — Other Ambulatory Visit (HOSPITAL_COMMUNITY): Payer: Self-pay

## 2022-12-18 ENCOUNTER — Other Ambulatory Visit: Payer: Self-pay

## 2022-12-18 ENCOUNTER — Other Ambulatory Visit (HOSPITAL_COMMUNITY): Payer: Self-pay

## 2022-12-18 MED ORDER — SOLOSEC 2 G PO PACK
2.0000 g | PACK | Freq: Every day | ORAL | 0 refills | Status: DC
  Filled 2022-12-18 – 2022-12-25 (×3): qty 1, 1d supply, fill #0

## 2022-12-18 MED ORDER — METRONIDAZOLE 500 MG PO TABS
500.0000 mg | ORAL_TABLET | Freq: Two times a day (BID) | ORAL | 0 refills | Status: DC
  Filled 2022-12-18 (×2): qty 14, 7d supply, fill #0

## 2022-12-19 ENCOUNTER — Other Ambulatory Visit (HOSPITAL_COMMUNITY): Payer: Self-pay

## 2022-12-20 ENCOUNTER — Other Ambulatory Visit: Payer: Self-pay

## 2022-12-20 ENCOUNTER — Ambulatory Visit (AMBULATORY_SURGERY_CENTER): Payer: Commercial Managed Care - PPO | Admitting: *Deleted

## 2022-12-20 ENCOUNTER — Encounter: Payer: Self-pay | Admitting: Gastroenterology

## 2022-12-20 VITALS — Ht 62.0 in | Wt 240.0 lb

## 2022-12-20 DIAGNOSIS — Z1211 Encounter for screening for malignant neoplasm of colon: Secondary | ICD-10-CM

## 2022-12-20 MED ORDER — NA SULFATE-K SULFATE-MG SULF 17.5-3.13-1.6 GM/177ML PO SOLN
1.0000 | Freq: Once | ORAL | 0 refills | Status: AC
Start: 2022-12-20 — End: 2022-12-21
  Filled 2022-12-20: qty 354, 1d supply, fill #0

## 2022-12-20 NOTE — Progress Notes (Signed)
Pt's name and DOB verified at the beginning of the pre-visit wit 2 identifiers  Pt denies any difficulty with ambulating,sitting, laying down or rolling side to side  Gave both [facility] main # and MD on call # prior to instructions.   No egg or soy allergy known to patient   No issues known to pt with past sedation with any surgeries or procedures Pt has hx of PONV  Pt has no issues moving head neck or swallowing  No FH of Malignant Hyperthermia  Pt is not on diet pills or shots  Pt is not on home 02   Pt is not on blood thinners   Pt has frequent issues with constipation RN instructed pt to use Miralax per bottles instructions a week before prep days. Pt states they will  Pt is not on dialysis  Pt denise any abnormal heart rhythms   Pt denies any upcoming cardiac testing  Pt encouraged to use to use Singlecare or Goodrx to reduce cost   Patient's chart reviewed by Christine Best CNRA prior to pre-visit and patient appropriate for the [facility].  Pre-visit completed and red dot placed by patient's name on their procedure day (on provider's schedule).  .  Visit by phone  Pt states weight is 240  Instructed pt why it is important to and  to call if they have any changes in health or new medications. Directed them to the # given and on instructions.     Instructions reviewed with pt and pt states understanding. Instructed to review again prior to procedure. Pt states they will.   Instructions sent by mail with coupon and by my chart

## 2022-12-25 ENCOUNTER — Other Ambulatory Visit (HOSPITAL_COMMUNITY): Payer: Self-pay

## 2022-12-25 ENCOUNTER — Telehealth: Payer: Self-pay | Admitting: Gastroenterology

## 2022-12-25 NOTE — Telephone Encounter (Signed)
PT is calling to get a more in depth explanation of how to prep for procedure. Please advise.

## 2022-12-25 NOTE — Telephone Encounter (Signed)
Spoke with patient all questions have been addressed

## 2023-01-01 ENCOUNTER — Other Ambulatory Visit (HOSPITAL_COMMUNITY): Payer: Self-pay

## 2023-01-01 ENCOUNTER — Other Ambulatory Visit: Payer: Self-pay

## 2023-01-03 ENCOUNTER — Other Ambulatory Visit: Payer: Self-pay

## 2023-01-03 ENCOUNTER — Ambulatory Visit: Payer: Commercial Managed Care - PPO | Admitting: Gastroenterology

## 2023-01-03 ENCOUNTER — Encounter: Payer: Self-pay | Admitting: Gastroenterology

## 2023-01-03 ENCOUNTER — Other Ambulatory Visit (HOSPITAL_COMMUNITY): Payer: Self-pay

## 2023-01-03 VITALS — BP 142/86 | HR 91 | Temp 97.7°F | Resp 9 | Ht 62.0 in | Wt 240.0 lb

## 2023-01-03 DIAGNOSIS — E119 Type 2 diabetes mellitus without complications: Secondary | ICD-10-CM | POA: Diagnosis not present

## 2023-01-03 DIAGNOSIS — I10 Essential (primary) hypertension: Secondary | ICD-10-CM | POA: Diagnosis not present

## 2023-01-03 DIAGNOSIS — Z1211 Encounter for screening for malignant neoplasm of colon: Secondary | ICD-10-CM

## 2023-01-03 DIAGNOSIS — E669 Obesity, unspecified: Secondary | ICD-10-CM | POA: Diagnosis not present

## 2023-01-03 HISTORY — PX: COLONOSCOPY: SHX174

## 2023-01-03 MED ORDER — SODIUM CHLORIDE 0.9 % IV SOLN: 500.0000 mL | Freq: Once | INTRAVENOUS | Status: DC

## 2023-01-03 MED ORDER — HYDROCORT-PRAMOXINE (PERIANAL) 2.5-1 % EX CREA
TOPICAL_CREAM | CUTANEOUS | 1 refills | Status: DC | PRN
  Filled 2023-01-03 – 2023-01-04 (×2): qty 30, 7d supply, fill #0

## 2023-01-03 NOTE — Progress Notes (Signed)
[facility] History and Physical   Primary Care Physician:  Ardith Dark, MD   Reason for Procedure:  Colorectal cancer screening  Plan:    Screening colonoscopy with possible interventions as needed     HPI: Christine Best is a very pleasant 47 y.o. female here for screening colonoscopy. Denies any nausea, vomiting, abdominal pain, melena or bright red blood per rectum  The risks and benefits as well as alternatives of endoscopic procedure(s) have been discussed and reviewed. All questions answered. The patient agrees to proceed.    Past Medical History:  Diagnosis Date   Anemia    Chronic constipation    Delivery with history of C-section 08/03/2019   History of MRSA infection    HTN (hypertension)    followed by pcp   Infertility, female    Pelvic adhesions    Type 2 diabetes mellitus (HCC)    followed by pcp;   fasting sugar-- 130   (does not check blood sugar on regular basis)   Uterine fibroid    Wears contact lenses     Past Surgical History:  Procedure Laterality Date   ADENOIDECTOMY  age 12   CESAREAN SECTION N/A 08/03/2019   Procedure: CESAREAN SECTION;  Surgeon: [name], DO;  Location: [facility];  Service: Obstetrics;  Laterality: N/A;  Tracey RNFA   HYSTEROSCOPY N/A 11/07/2018   Procedure: HYSTEROSCOPY, endometrial biopsy;  Surgeon: Fermin Schwab, MD;  Location: [facility];  Service: Gynecology;  Laterality: N/A;   ROBOT ASSISTED MYOMECTOMY N/A 03/26/2018   Procedure: XI ROBOTIC ASSISTED LAPAROSCOPIC MYOMECTOMY WITH TRANSVAGINAL ULTRASOUND GUIDANCE AND EXCISION OF ENDOMETRIOSIS;  Surgeon: Fermin Schwab, MD;  Location: [facility];  Service: Gynecology;  Laterality: N/A;   ROBOT ASSISTED MYOMECTOMY  01-24-2010   dr Tamela Oddi @[facility]    and [facility] HYSTEROSCOPY   UMBILICAL HERNIA REPAIR N/A 08/15/2022   Procedure: HERNIA REPAIR UMBILICAL ADULT;  Surgeon: Berna Bue, MD;  Location: [facility];   Service: General;  Laterality: N/A;    Prior to Admission medications   Medication Sig Start Date End Date Taking? Authorizing Provider  Continuous Blood Gluc Receiver (DEXCOM G6 RECEIVER) DEVI Use daily as needed to check blood sugar. 09/20/21  Yes Ardith Dark, MD  Continuous Blood Gluc Sensor (DEXCOM G6 SENSOR) MISC Use daily as needed to check blood sugar. 09/20/21  Yes Ardith Dark, MD  amLODipine (NORVASC) 5 MG tablet Take 1 tablet (5 mg total) by mouth daily. 05/22/22   Ardith Dark, MD  metroNIDAZOLE (FLAGYL) 500 MG tablet Take 1 tablet (500 mg total) by mouth 2 (two) times daily. Patient not taking: Reported on 01/03/2023 12/17/22     misoprostol (CYTOTEC) 200 MCG tablet Take 1 tablet (200 mcg total) by mouth every 4 (four) hours as directed for 1 day prior to appointment.  **For am prior to appointment** Patient not taking: Reported on 01/03/2023 11/09/22     Norgestimate-Ethinyl Estradiol Triphasic (ORTHO TRI-CYCLEN LO) 0.18/0.215/0.25 MG-25 MCG tab Take 1 tablet by mouth daily. Patient not taking: Reported on 01/03/2023 03/02/22   Ardith Dark, MD  Secnidazole (SOLOSEC) 2 g PACK Take 2 g by mouth for 1 dose. Patient not taking: Reported on 12/20/2022 12/17/22     Semaglutide, 2 MG/DOSE, (OZEMPIC, 2 MG/DOSE,) 8 MG/3ML SOPN Inject 2 mg into the skin once a week. 12/04/22   Ardith Dark, MD  tirzepatide [facility]) 15 MG/0.5ML Pen Inject 15 mg into the skin once a  week. Patient not taking: Reported on 12/20/2022 09/25/22   Ardith Dark, MD    Current Outpatient Medications  Medication Sig Dispense Refill   Continuous Blood Gluc Receiver (DEXCOM G6 RECEIVER) DEVI Use daily as needed to check blood sugar. 1 each 5   Continuous Blood Gluc Sensor (DEXCOM G6 SENSOR) MISC Use daily as needed to check blood sugar. 3 each 5   amLODipine (NORVASC) 5 MG tablet Take 1 tablet (5 mg total) by mouth daily. 90 tablet 3   metroNIDAZOLE (FLAGYL) 500 MG tablet Take 1 tablet (500 mg total) by  mouth 2 (two) times daily. (Patient not taking: Reported on 01/03/2023) 14 tablet 0   misoprostol (CYTOTEC) 200 MCG tablet Take 1 tablet (200 mcg total) by mouth every 4 (four) hours as directed for 1 day prior to appointment.  **For am prior to appointment** (Patient not taking: Reported on 01/03/2023) 2 tablet 0   Norgestimate-Ethinyl Estradiol Triphasic (ORTHO TRI-CYCLEN LO) 0.18/0.215/0.25 MG-25 MCG tab Take 1 tablet by mouth daily. (Patient not taking: Reported on 01/03/2023) 28 tablet 11   Secnidazole (SOLOSEC) 2 g PACK Take 2 g by mouth for 1 dose. (Patient not taking: Reported on 12/20/2022) 1 each 0   Semaglutide, 2 MG/DOSE, (OZEMPIC, 2 MG/DOSE,) 8 MG/3ML SOPN Inject 2 mg into the skin once a week. 9 mL 3   tirzepatide (MOUNJARO) 15 MG/0.5ML Pen Inject 15 mg into the skin once a week. (Patient not taking: Reported on 12/20/2022) 6 mL 0   Current Facility-Administered Medications  Medication Dose Route Frequency Provider Last Rate Last Admin   0.9 %  sodium chloride infusion  500 mL Intravenous Once Napoleon Form, MD        Allergies as of 01/03/2023 - Review Complete 01/03/2023  Allergen Reaction Noted   Lisinopril Swelling and Other (See Comments) 10/15/2017    Family History  Problem Relation Age of Onset   Colon polyps Mother    Diabetes Mother    Hypertension Mother    Cancer Father    Diabetes Brother    Cancer Maternal Aunt        type unknown   Colon cancer Neg Hx    Esophageal cancer Neg Hx    Stomach cancer Neg Hx    Rectal cancer Neg Hx     Social History   Socioeconomic History   Marital status: Single    Spouse name: Not on file   Number of children: 0   Years of education: Not on file   Highest education level: Master's degree (e.g., MA, MS, MEng, MEd, MSW, MBA)  Occupational History   Occupation: Freight forwarder  Tobacco Use   Smoking status: Never   Smokeless tobacco: Never  Vaping Use   Vaping status: Never Used  Substance and Sexual  Activity   Alcohol use: Yes    Alcohol/week: 0.0 standard drinks of alcohol    Comment: occasional   Drug use: No   Sexual activity: Yes    Birth control/protection: None  Other Topics Concern   Not on file  Social History Narrative   Not on file   Social Determinants of Health   Financial Resource Strain: Low Risk  (05/21/2022)   Overall Financial Resource Strain (CARDIA)    Difficulty of Paying Living Expenses: Not hard at all  Food Insecurity: No Food Insecurity (05/21/2022)   Hunger Vital Sign    Worried About Running Out of Food in the Last Year: Never true    Ran Out  of Food in the Last Year: Never true  Transportation Needs: No Transportation Needs (05/21/2022)   PRAPARE - Administrator, Civil Service (Medical): No    Lack of Transportation (Non-Medical): No  Physical Activity: Unknown (05/21/2022)   Exercise Vital Sign    Days of Exercise per Week: 0 days    Minutes of Exercise per Session: Not on file  Stress: No Stress Concern Present (05/21/2022)   [employer] of Occupational Health - Occupational Stress Questionnaire    Feeling of Stress : Not at all  Social Connections: Moderately Integrated (05/21/2022)   Social Connection and Isolation Panel [NHANES]    Frequency of Communication with Friends and Family: More than three times a week    Frequency of Social Gatherings with Friends and Family: Once a week    Attends Religious Services: 1 to 4 times per year    Active Member of [employer] or Organizations: No    Attends Engineer, structural: Not on file    Marital Status: Living with partner  Intimate Partner Violence: Not on file    Review of Systems:  All other review of systems negative except as mentioned in the HPI.  Physical Exam: Vital signs in last 24 hours: BP (!) 149/102   Pulse 82   Temp 97.7 F (36.5 C)   Ht 5\' 2"  (1.575 m)   Wt 240 lb (108.9 kg)   LMP 01/02/2023 (Exact Date)   SpO2 99%   BMI 43.90 kg/m  General:   Alert,  NAD Lungs:  Clear .   Heart:  Regular rate and rhythm Abdomen:  Soft, nontender and nondistended. Neuro/Psych:  Alert and cooperative. Normal mood and affect. A and O x 3  Reviewed labs, radiology imaging, old records and pertinent past GI work up  Patient is appropriate for planned procedure(s) and anesthesia in an ambulatory setting   K. Scherry Ran , MD [phone]

## 2023-01-03 NOTE — Op Note (Signed)
[facility] Patient Name: Christine Best Procedure Date: 01/03/2023 2:04 PM MRN: [id number] Endoscopist: Napoleon Form , MD, [id number] Age: 47 Referring MD:  Date of Birth: [birth year] Gender: Female Account #: [id number] Procedure:                Colonoscopy Indications:              Screening for colorectal malignant neoplasm Medicines:                Monitored Anesthesia Care Procedure:                Pre-Anesthesia Assessment:                           - Prior to the procedure, a History and Physical                            was performed, and patient medications and                            allergies were reviewed. The patient's tolerance of                            previous anesthesia was also reviewed. The risks                            and benefits of the procedure and the sedation                            options and risks were discussed with the patient.                            All questions were answered, and informed consent                            was obtained. Prior Anticoagulants: The patient has                            taken no anticoagulant or antiplatelet agents. ASA                            Grade Assessment: II - A patient with mild systemic                            disease. After reviewing the risks and benefits,                            the patient was deemed in satisfactory condition to                            undergo the procedure.                           After obtaining informed consent, the colonoscope  was passed under direct vision. Throughout the                            procedure, the patient's blood pressure, pulse, and                            oxygen saturations were monitored continuously. The                            [id number]-[id number] Colonoscope [id number] was introduced                            through the anus and advanced to the the cecum,                            identified by  appendiceal orifice and ileocecal                            valve. The colonoscopy was performed without                            difficulty. The patient tolerated the procedure                            well. The quality of the bowel preparation was                            adequate to identify polyps greater than 5 mm in                            size. The ileocecal valve, appendiceal orifice, and                            rectum were photographed. Scope In: 2:05:00 PM Scope Out: 2:32:03 PM Scope Withdrawal Time: 0 hours 11 minutes 34 seconds  Total Procedure Duration: 0 hours 27 minutes 3 seconds  Findings:                 The perianal and digital rectal examinations were                            normal.                           Liquid semi-liquid stool was found in the entire                            colon, making visualization difficult. Lavage of                            the area was performed, resulting in clearance with                            adequate visualization.  Scattered small-mouthed diverticula were found in                            the sigmoid colon, descending colon, transverse                            colon and ascending colon.                           Non-bleeding external and internal hemorrhoids were                            found during retroflexion. The hemorrhoids were                            small.                           The exam was otherwise without abnormality. Complications:            No immediate complications. Estimated Blood Loss:     Estimated blood loss was minimal. Impression:               - Stool in the entire examined colon.                           - Diverticulosis in the sigmoid colon, in the                            descending colon, in the transverse colon and in                            the ascending colon.                           - Non-bleeding external and internal  hemorrhoids.                           - The examination was otherwise normal.                           - No specimens collected. Recommendation:           - Patient has a contact number available for                            emergencies. The signs and symptoms of potential                            delayed complications were discussed with the                            patient. Return to normal activities tomorrow.                            Written discharge instructions were provided to the  patient.                           - Resume previous diet.                           - Continue present medications.                           - Repeat colonoscopy in 10 years for surveillance.                           - For future colonoscopy the patient will require                            an extended preparation. If there are any                            questions, please contact the gastroenterologist.                           - Use Analpram HC Cream 2.5%: Apply externally as                            necessary for 7 days. Napoleon Form, MD 01/03/2023 2:46:58 PM This report has been signed electronically.

## 2023-01-03 NOTE — Patient Instructions (Signed)
-   Resume previous diet. - Continue present medications. - Repeat colonoscopy in 10 years for surveillance. - For future colonoscopy the patient will require an extended preparation. If there are any questions, please contact the gastroenterologist. - Use Analpram HC Cream 2.5%: Apply externally as necessary for 7 days.  YOU HAD AN ENDOSCOPIC PROCEDURE TODAY AT THE [facility]:   Refer to the procedure report that was given to you for any specific questions about what was found during the examination.  If the procedure report does not answer your questions, please call your gastroenterologist to clarify.  If you requested that your care partner not be given the details of your procedure findings, then the procedure report has been included in a sealed envelope for you to review at your convenience later.  YOU SHOULD EXPECT: Some feelings of bloating in the abdomen. Passage of more gas than usual.  Walking can help get rid of the air that was put into your GI tract during the procedure and reduce the bloating. If you had a lower endoscopy (such as a colonoscopy or flexible sigmoidoscopy) you may notice spotting of blood in your stool or on the toilet paper. If you underwent a bowel prep for your procedure, you may not have a normal bowel movement for a few days.  Please Note:  You might notice some irritation and congestion in your nose or some drainage.  This is from the oxygen used during your procedure.  There is no need for concern and it should clear up in a day or so.  SYMPTOMS TO REPORT IMMEDIATELY:  Following lower endoscopy (colonoscopy or flexible sigmoidoscopy):  Excessive amounts of blood in the stool  Significant tenderness or worsening of abdominal pains  Swelling of the abdomen that is new, acute  Fever of 100F or higher  For urgent or emergent issues, a gastroenterologist can be reached at any hour by calling [phone]. Do not use MyChart messaging for urgent  concerns.    DIET:  We do recommend a small meal at first, but then you may proceed to your regular diet.  Drink plenty of fluids but you should avoid alcoholic beverages for 24 hours.  ACTIVITY:  You should plan to take it easy for the rest of today and you should NOT DRIVE or use heavy machinery until tomorrow (because of the sedation medicines used during the test).    FOLLOW UP: Our staff will call the number listed on your records the next business day following your procedure.  We will call around 7:15- 8:00 am to check on you and address any questions or concerns that you may have regarding the information given to you following your procedure. If we do not reach you, we will leave a message.     If any biopsies were taken you will be contacted by phone or by letter within the next 1-3 weeks.  Please call us at [phone] if you have not heard about the biopsies in 3 weeks.    SIGNATURES/CONFIDENTIALITY: You and/or your care partner have signed paperwork which will be entered into your electronic medical record.  These signatures attest to the fact that that the information above on your After Visit Summary has been reviewed and is understood.  Full responsibility of the confidentiality of this discharge information lies with you and/or your care-partner.

## 2023-01-03 NOTE — Progress Notes (Signed)
Pt's states no medical or surgical changes since previsit or office visit. 

## 2023-01-03 NOTE — Progress Notes (Signed)
Sedate, gd SR, tolerated procedure well, VSS, report to RN 

## 2023-01-04 ENCOUNTER — Other Ambulatory Visit: Payer: Self-pay

## 2023-01-04 ENCOUNTER — Other Ambulatory Visit (HOSPITAL_COMMUNITY): Payer: Self-pay

## 2023-01-04 ENCOUNTER — Telehealth: Payer: Self-pay

## 2023-01-04 NOTE — Telephone Encounter (Signed)
  Follow up Call-     01/03/2023    1:29 PM  Call back number  Post procedure Call Back phone  # [phone]  Permission to leave phone message Yes     Patient questions:  Do you have a fever, pain , or abdominal swelling? No. Pain Score  0 *  Have you tolerated food without any problems? Yes.    Have you been able to return to your normal activities? Yes.    Do you have any questions about your discharge instructions: Diet   No. Medications  No. Follow up visit  No.  Do you have questions or concerns about your Care? No.  Actions: * If pain score is 4 or above: No action needed, pain <4.

## 2023-01-08 DIAGNOSIS — H52209 Unspecified astigmatism, unspecified eye: Secondary | ICD-10-CM | POA: Diagnosis not present

## 2023-01-08 DIAGNOSIS — H5213 Myopia, bilateral: Secondary | ICD-10-CM | POA: Diagnosis not present

## 2023-01-11 ENCOUNTER — Other Ambulatory Visit: Payer: Self-pay

## 2023-01-11 ENCOUNTER — Encounter (HOSPITAL_BASED_OUTPATIENT_CLINIC_OR_DEPARTMENT_OTHER): Payer: Self-pay | Admitting: Obstetrics and Gynecology

## 2023-01-11 NOTE — Progress Notes (Signed)
Your procedure is scheduled on Thursday, 01/24/2023.  Report to [facility] AT  5:30 AM.   Call this number if you have problems the morning of surgery  :[phone].   OUR ADDRESS IS [address].  WE ARE LOCATED IN [facility].  PLEASE BRING YOUR INSURANCE CARD AND PHOTO ID DAY OF SURGERY.                                     REMEMBER:  DO NOT EAT FOOD, CANDY GUM OR MINTS  AFTER MIDNIGHT THE NIGHT BEFORE YOUR SURGERY . YOU MAY HAVE CLEAR LIQUIDS FROM MIDNIGHT THE NIGHT BEFORE YOUR SURGERY UNTIL  4:30 AM. NO CLEAR LIQUIDS AFTER   4:30 AM DAY OF SURGERY.  YOU MAY  BRUSH YOUR TEETH MORNING OF SURGERY AND RINSE YOUR MOUTH OUT, NO CHEWING GUM CANDY OR MINTS.     CLEAR LIQUID DIET    Allowed      Water                                                                   Coffee and tea, regular and decaf  (NO cream or milk products of any type, may sweeten)                         Carbonated beverages, regular and diet                                    Sports drinks like Gatorade _____________________________________________________________________     TAKE ONLY THESE MEDICATIONS MORNING OF SURGERY: Amlodipine  Your last dose of Ozempic before surgery should be on 01/17/23.                                        DO NOT WEAR JEWERLY/  METAL/  PIERCINGS (INCLUDING NO PLASTIC PIERCINGS) DO NOT WEAR LOTIONS, POWDERS, PERFUMES OR NAIL POLISH ON YOUR FINGERNAILS. TOENAIL POLISH IS OK TO WEAR. DO NOT SHAVE FOR 48 HOURS PRIOR TO DAY OF SURGERY.  CONTACTS, GLASSES, OR DENTURES MAY NOT BE WORN TO SURGERY.  REMEMBER: NO SMOKING, VAPING ,  DRUGS OR ALCOHOL FOR 24 HOURS BEFORE YOUR SURGERY.                                    [facility] IS NOT RESPONSIBLE  FOR ANY BELONGINGS.                                                                    [name]           [facility] - Preparing for Surgery Before surgery,  you can play an important role.  Because  skin is not sterile, your skin needs to be as free of germs as possible.  You can reduce the number of germs on your skin by washing with CHG (chlorahexidine gluconate) soap before surgery.  CHG is an antiseptic cleaner which kills germs and bonds with the skin to continue killing germs even after washing. Please DO NOT use if you have an allergy to CHG or antibacterial soaps.  If your skin becomes reddened/irritated stop using the CHG and inform your nurse when you arrive at Short Stay. Do not shave (including legs and underarms) for at least 48 hours prior to the first CHG shower.  You may shave your face/neck. Please follow these instructions carefully:  1.  Shower with CHG Soap the night before surgery and the  morning of Surgery.  2.  If you choose to wash your hair, wash your hair first as usual with your  normal  shampoo.  3.  After you shampoo, rinse your hair and body thoroughly to remove the  shampoo.                                        4.  Use CHG as you would any other liquid soap.  You can apply chg directly  to the skin and wash , chg soap provided, night before and morning of your surgery.  5.  Apply the CHG Soap to your body ONLY FROM THE NECK DOWN.   Do not use on face/ open                           Wound or open sores. Avoid contact with eyes, ears mouth and genitals (private parts).                       Wash face,  Genitals (private parts) with your normal soap.             6.  Wash thoroughly, paying special attention to the area where your surgery  will be performed.  7.  Thoroughly rinse your body with warm water from the neck down.  8.  DO NOT shower/wash with your normal soap after using and rinsing off  the CHG Soap.             9.  Pat yourself dry with a clean towel.            10.  Wear clean pajamas.            11.  Place clean sheets on your bed the night of your first shower and do not  sleep with pets. Day of Surgery : Do not apply any lotions/ powders the  morning of surgery.  Please wear clean clothes to the hospital/surgery center.  IF YOU HAVE ANY SKIN IRRITATION OR PROBLEMS WITH THE SURGICAL SOAP, PLEASE GET A BAR OF GOLD DIAL SOAP AND SHOWER THE NIGHT BEFORE YOUR SURGERY AND THE MORNING OF YOUR SURGERY. PLEASE LET THE NURSE KNOW MORNING OF YOUR SURGERY IF YOU HAD ANY PROBLEMS WITH THE SURGICAL SOAP.   YOUR SURGEON MAY HAVE REQUESTED EXTENDED RECOVERY TIME AFTER YOUR SURGERY. IT COULD BE A  JUST A FEW HOURS  UP TO AN OVERNIGHT STAY.  YOUR SURGEON SHOULD HAVE DISCUSSED THIS WITH YOU PRIOR TO YOUR SURGERY.  IN THE EVENT YOU NEED TO STAY OVERNIGHT PLEASE REFER TO THE FOLLOWING GUIDELINES. YOU MAY HAVE UP TO 4 VISITORS  MAY VISIT IN THE EXTENDED RECOVERY ROOM UNTIL 800 PM ONLY.  ONE  VISITOR AGE 41 AND OVER MAY SPEND THE NIGHT AND MUST BE IN EXTENDED RECOVERY ROOM NO LATER THAN 800 PM . YOUR DISCHARGE TIME AFTER YOU SPEND THE NIGHT IS 900 AM THE MORNING AFTER YOUR SURGERY. YOU MAY PACK A SMALL OVERNIGHT BAG WITH TOILETRIES FOR YOUR OVERNIGHT STAY IF YOU WISH.  REGARDLESS OF IF YOU STAY OVER NIGHT OR ARE DISCHARGED THE SAME DAY YOU WILL BE REQUIRED TO HAVE A RESPONSIBLE ADULT (18 YRS OLD OR OLDER) STAY WITH YOU FOR AT LEAST THE FIRST 24 HOURS  YOUR PRESCRIPTION MEDICATIONS WILL BE PROVIDED DURING YOUR HOSPITAL STAY.  ________________________________________________________________________                                                        QUESTIONS Mechele Claude PRE OP NURSE PHONE [phone].

## 2023-01-11 NOTE — Progress Notes (Signed)
Spoke w/ via phone for pre-op interview---[facility] needs dos---- UPT        Lab results------01/22/23 lab appt for cbc, type & screen, bmp, 07/31/22 EKG in chart & Epic COVID test -----patient states asymptomatic no test needed Arrive at -------0530 on Thursday, 01/24/2023 NPO after MN NO Solid Food.  Clear liquids from MN until---0430 Med rec completed Medications to take morning of surgery -----Amlodipine Diabetic medication -----Last dose of Ozempic before surgery should be on 01/17/23. Patient instructed no nail polish to be worn day of surgery Patient instructed to bring photo id and insurance card day of surgery Patient aware to have Driver (ride ) / caregiver    for 24 hours after surgery - mother, Vanita Patient Special Instructions -----Extended / overnight stay instructions given. Pre-Op special Instructions -----none Patient verbalized understanding of instructions that were given at this phone interview. Patient denies chest pain, sob, fever, cough at the interview.

## 2023-01-15 NOTE — H&P (Signed)
Christine Best is an 47 y.o.G1P1001 female here for scheduled surgery. - she is scheduled for a total laparoscopic hysterectomy with bilateral salpingectomy and cystoscopy. She has been counseled regarding risks.benefits and possible need for open  US guided EMbx due to failed initial attempt without [place] was negative on 10/25.   Pt has a history of HTN, Type 2 DM, fibroids and obesity. Now on monjauro- lost 13lbs.  She had a robotic myomectomy in 2008 and again in 2020. She has completed childbearing  Pertinent Gynecological History: Menses:  heavy Bleeding: monthly  Contraception: none DES exposure: denies Blood transfusions: none Sexually transmitted diseases: no past history Previous GYN Procedures:  myomectomy x 2   Last mammogram: normal Date: 10/2022 Last pap: normal Date: 2022 OB History: G1, P1001   Menstrual History: Menarche age: 67 Patient's last menstrual period was 01/02/2023 (exact date).    Past Medical History:  Diagnosis Date   Anemia    Chronic constipation    Delivery with history of C-section 08/03/2019   History of MRSA infection    HTN (hypertension)    followed by pcp   Infertility, female    Menorrhagia    Type 2 diabetes mellitus (HCC)    followed by pcp;   fasting sugar-- 130   (does not check blood sugar on regular basis)   Uterine fibroid    Wears contact lenses     Past Surgical History:  Procedure Laterality Date   ADENOIDECTOMY  age 33   CESAREAN SECTION N/A 08/03/2019   Procedure: CESAREAN SECTION;  Surgeon: [name], DO;  Location: [facility];  Service: Obstetrics;  Laterality: N/A;  Tracey RNFA   COLONOSCOPY  01/03/2023   normal   HYSTEROSCOPY N/A 11/07/2018   Procedure: HYSTEROSCOPY, endometrial biopsy;  Surgeon: Fermin Schwab, MD;  Location: [facility];  Service: Gynecology;  Laterality: N/A;   ROBOT ASSISTED MYOMECTOMY N/A 03/26/2018   Procedure: XI ROBOTIC ASSISTED LAPAROSCOPIC MYOMECTOMY WITH  TRANSVAGINAL ULTRASOUND GUIDANCE AND EXCISION OF ENDOMETRIOSIS;  Surgeon: Fermin Schwab, MD;  Location: [facility];  Service: Gynecology;  Laterality: N/A;   ROBOT ASSISTED MYOMECTOMY  01-24-2010   dr Tamela Oddi @[facility]    and [facility] HYSTEROSCOPY   UMBILICAL HERNIA REPAIR N/A 08/15/2022   Procedure: HERNIA REPAIR UMBILICAL ADULT;  Surgeon: Berna Bue, MD;  Location: [facility];  Service: General;  Laterality: N/A;    Family History  Problem Relation Age of Onset   Colon polyps Mother    Diabetes Mother    Hypertension Mother    Cancer Father    Diabetes Brother    Cancer Maternal Aunt        type unknown   Colon cancer Neg Hx    Esophageal cancer Neg Hx    Stomach cancer Neg Hx    Rectal cancer Neg Hx     Social History:  reports that she has never smoked. She has never used smokeless tobacco. She reports current alcohol use. She reports that she does not use drugs.  Allergies:  Allergies  Allergen Reactions   Lisinopril Anaphylaxis, Swelling and Other (See Comments)    angioedema    No medications prior to admission.    Review of Systems  Height 5\' 3"  (1.6 m), weight 108.9 kg, last menstrual period 01/02/2023. Physical Exam  No results found for this or any previous visit (from the past 24 hour(s)).  No results found.  Assessment/Plan: 47yo G1P1001 female with worsening menorrhagia, s/p myomectomy x  2 here for total laparoscopic hysterectomy with bilateral salpingectomy and cystoscopy, possible open  - Admit  - ERAS protocol  - Verify consent  - To OR when ready   Christine Best 01/15/2023, 8:27 AM

## 2023-01-21 DIAGNOSIS — Z01812 Encounter for preprocedural laboratory examination: Secondary | ICD-10-CM | POA: Diagnosis not present

## 2023-01-22 ENCOUNTER — Encounter (HOSPITAL_COMMUNITY)
Admission: RE | Admit: 2023-01-22 | Discharge: 2023-01-22 | Disposition: A | Discharge status: Home / Self Care | Payer: Commercial Managed Care - PPO | Source: Ambulatory Visit | Attending: Obstetrics and Gynecology

## 2023-01-22 DIAGNOSIS — Z01818 Encounter for other preprocedural examination: Secondary | ICD-10-CM

## 2023-01-22 DIAGNOSIS — N920 Excessive and frequent menstruation with regular cycle: Secondary | ICD-10-CM | POA: Insufficient documentation

## 2023-01-22 DIAGNOSIS — Z01812 Encounter for preprocedural laboratory examination: Secondary | ICD-10-CM | POA: Insufficient documentation

## 2023-01-22 LAB — CBC
HCT: 31.8 % — ABNORMAL LOW (ref 36.0–46.0)
Hemoglobin: 9.3 g/dL — ABNORMAL LOW (ref 12.0–15.0)
MCH: 20.5 pg — ABNORMAL LOW (ref 26.0–34.0)
MCHC: 29.2 g/dL — ABNORMAL LOW (ref 30.0–36.0)
MCV: 70 fL — ABNORMAL LOW (ref 80.0–100.0)
Platelets: 423 10*3/uL — ABNORMAL HIGH (ref 150–400)
RBC: 4.54 MIL/uL (ref 3.87–5.11)
RDW: 16.9 % — ABNORMAL HIGH (ref 11.5–15.5)
WBC: 6.4 10*3/uL (ref 4.0–10.5)
nRBC: 0 % (ref 0.0–0.2)

## 2023-01-22 LAB — BASIC METABOLIC PANEL
Anion gap: 8 (ref 5–15)
BUN: 9 mg/dL (ref 6–20)
CO2: 21 mmol/L — ABNORMAL LOW (ref 22–32)
Calcium: 8.8 mg/dL — ABNORMAL LOW (ref 8.9–10.3)
Chloride: 108 mmol/L (ref 98–111)
Creatinine, Ser: 0.6 mg/dL (ref 0.44–1.00)
GFR, Estimated: >60 mL/min (ref >60)
Glucose, Bld: 124 mg/dL — ABNORMAL HIGH (ref 70–99)
Potassium: 3.3 mmol/L — ABNORMAL LOW (ref 3.5–5.1)
Sodium: 137 mmol/L (ref 135–145)

## 2023-01-24 ENCOUNTER — Other Ambulatory Visit: Payer: Self-pay

## 2023-01-24 ENCOUNTER — Ambulatory Visit (HOSPITAL_BASED_OUTPATIENT_CLINIC_OR_DEPARTMENT_OTHER): Payer: Commercial Managed Care - PPO | Admitting: Anesthesiology

## 2023-01-24 ENCOUNTER — Inpatient Hospital Stay (HOSPITAL_BASED_OUTPATIENT_CLINIC_OR_DEPARTMENT_OTHER)
Admission: AD | Admit: 2023-01-24 | Discharge: 2023-01-25 | DRG: 743 | Disposition: A | Discharge status: Home / Self Care | Payer: Commercial Managed Care - PPO | Attending: Obstetrics and Gynecology | Admitting: Obstetrics and Gynecology

## 2023-01-24 ENCOUNTER — Encounter (HOSPITAL_COMMUNITY)
Admission: AD | Disposition: A | Discharge status: Home / Self Care | Payer: Self-pay | Source: Home / Self Care | Attending: Obstetrics and Gynecology

## 2023-01-24 ENCOUNTER — Encounter (HOSPITAL_BASED_OUTPATIENT_CLINIC_OR_DEPARTMENT_OTHER): Payer: Self-pay | Admitting: Obstetrics and Gynecology

## 2023-01-24 DIAGNOSIS — Z833 Family history of diabetes mellitus: Secondary | ICD-10-CM

## 2023-01-24 DIAGNOSIS — Z9109 Other allergy status, other than to drugs and biological substances: Secondary | ICD-10-CM

## 2023-01-24 DIAGNOSIS — D500 Iron deficiency anemia secondary to blood loss (chronic): Secondary | ICD-10-CM | POA: Diagnosis present

## 2023-01-24 DIAGNOSIS — Z8614 Personal history of Methicillin resistant Staphylococcus aureus infection: Secondary | ICD-10-CM | POA: Diagnosis not present

## 2023-01-24 DIAGNOSIS — Z90710 Acquired absence of both cervix and uterus: Secondary | ICD-10-CM | POA: Diagnosis present

## 2023-01-24 DIAGNOSIS — N920 Excessive and frequent menstruation with regular cycle: Principal | ICD-10-CM | POA: Diagnosis present

## 2023-01-24 DIAGNOSIS — Z01818 Encounter for other preprocedural examination: Principal | ICD-10-CM

## 2023-01-24 DIAGNOSIS — E119 Type 2 diabetes mellitus without complications: Secondary | ICD-10-CM | POA: Diagnosis not present

## 2023-01-24 DIAGNOSIS — I10 Essential (primary) hypertension: Secondary | ICD-10-CM | POA: Diagnosis not present

## 2023-01-24 DIAGNOSIS — Z8249 Family history of ischemic heart disease and other diseases of the circulatory system: Secondary | ICD-10-CM | POA: Diagnosis not present

## 2023-01-24 DIAGNOSIS — K5909 Other constipation: Secondary | ICD-10-CM | POA: Diagnosis present

## 2023-01-24 DIAGNOSIS — K660 Peritoneal adhesions (postprocedural) (postinfection): Secondary | ICD-10-CM | POA: Diagnosis present

## 2023-01-24 DIAGNOSIS — Z98891 History of uterine scar from previous surgery: Secondary | ICD-10-CM

## 2023-01-24 DIAGNOSIS — D259 Leiomyoma of uterus, unspecified: Secondary | ICD-10-CM

## 2023-01-24 DIAGNOSIS — D251 Intramural leiomyoma of uterus: Secondary | ICD-10-CM | POA: Diagnosis not present

## 2023-01-24 DIAGNOSIS — N9489 Other specified conditions associated with female genital organs and menstrual cycle: Secondary | ICD-10-CM | POA: Diagnosis not present

## 2023-01-24 DIAGNOSIS — D252 Subserosal leiomyoma of uterus: Secondary | ICD-10-CM | POA: Diagnosis not present

## 2023-01-24 DIAGNOSIS — N8003 Adenomyosis of the uterus: Secondary | ICD-10-CM | POA: Diagnosis not present

## 2023-01-24 HISTORY — PX: TOTAL LAPAROSCOPIC HYSTERECTOMY WITH SALPINGECTOMY: SHX6742

## 2023-01-24 HISTORY — PX: CYSTOSCOPY: SHX5120

## 2023-01-24 HISTORY — DX: Excessive and frequent menstruation with regular cycle: N92.0

## 2023-01-24 LAB — TYPE AND SCREEN
ABO/RH(D): O POS
Antibody Screen: NEGATIVE

## 2023-01-24 LAB — CBC
HCT: 28.7 % — ABNORMAL LOW (ref 36.0–46.0)
Hemoglobin: 8.7 g/dL — ABNORMAL LOW (ref 12.0–15.0)
MCH: 20.9 pg — ABNORMAL LOW (ref 26.0–34.0)
MCHC: 30.3 g/dL (ref 30.0–36.0)
MCV: 69 fL — ABNORMAL LOW (ref 80.0–100.0)
Platelets: 383 10*3/uL (ref 150–400)
RBC: 4.16 MIL/uL (ref 3.87–5.11)
RDW: 17.1 % — ABNORMAL HIGH (ref 11.5–15.5)
WBC: 17.5 10*3/uL — ABNORMAL HIGH (ref 4.0–10.5)
nRBC: 0 % (ref 0.0–0.2)

## 2023-01-24 LAB — CREATININE, SERUM
Creatinine, Ser: 0.61 mg/dL (ref 0.44–1.00)
GFR, Estimated: >60 mL/min (ref >60)

## 2023-01-24 LAB — POCT PREGNANCY, URINE: Preg Test, Ur: NEGATIVE

## 2023-01-24 LAB — ABO/RH: ABO/RH(D): O POS

## 2023-01-24 SURGERY — HYSTERECTOMY, TOTAL, LAPAROSCOPIC, WITH SALPINGECTOMY
Anesthesia: General

## 2023-01-24 MED ORDER — DOCUSATE SODIUM 100 MG PO CAPS
100.0000 mg | ORAL_CAPSULE | Freq: Two times a day (BID) | ORAL | Status: DC
  Administered 2023-01-24 – 2023-01-25 (×2): 100 mg via ORAL
  Filled 2023-01-24 (×2): qty 1

## 2023-01-24 MED ORDER — CHLORHEXIDINE GLUCONATE 0.12 % MT SOLN: 15.0000 mL | Freq: Once | OROMUCOSAL | Status: DC

## 2023-01-24 MED ORDER — CEFAZOLIN SODIUM-DEXTROSE 2-4 GM/100ML-% IV SOLN
2.0000 g | INTRAVENOUS | Status: AC
  Administered 2023-01-24: 2 g via INTRAVENOUS

## 2023-01-24 MED ORDER — PROPOFOL 500 MG/50ML IV EMUL
INTRAVENOUS | Status: AC
  Filled 2023-01-24: qty 50

## 2023-01-24 MED ORDER — PHENYLEPHRINE 80 MCG/ML (10ML) SYRINGE FOR IV PUSH (FOR BLOOD PRESSURE SUPPORT)
PREFILLED_SYRINGE | INTRAVENOUS | Status: DC | PRN
  Administered 2023-01-24: 80 ug via INTRAVENOUS

## 2023-01-24 MED ORDER — DEXMEDETOMIDINE HCL IN NACL 80 MCG/20ML IV SOLN
INTRAVENOUS | Status: AC
  Filled 2023-01-24: qty 20

## 2023-01-24 MED ORDER — FENTANYL CITRATE (PF) 100 MCG/2ML IJ SOLN
INTRAMUSCULAR | Status: AC
  Filled 2023-01-24: qty 2

## 2023-01-24 MED ORDER — KETOROLAC TROMETHAMINE 30 MG/ML IJ SOLN
INTRAMUSCULAR | Status: AC
  Filled 2023-01-24: qty 1

## 2023-01-24 MED ORDER — SCOPOLAMINE 1 MG/3DAYS TD PT72
MEDICATED_PATCH | TRANSDERMAL | Status: AC
  Filled 2023-01-24: qty 1

## 2023-01-24 MED ORDER — GABAPENTIN 100 MG PO CAPS
100.0000 mg | ORAL_CAPSULE | Freq: Two times a day (BID) | ORAL | Status: DC
  Administered 2023-01-25: 100 mg via ORAL
  Filled 2023-01-24: qty 1

## 2023-01-24 MED ORDER — HEMOSTATIC AGENTS (NO CHARGE) OPTIME
TOPICAL | Status: DC | PRN
  Administered 2023-01-24: 1 via TOPICAL

## 2023-01-24 MED ORDER — MORPHINE SULFATE (PF) 2 MG/ML IV SOLN
1.0000 mg | INTRAVENOUS | Status: DC | PRN
  Administered 2023-01-24 – 2023-01-25 (×3): 2 mg via INTRAVENOUS
  Filled 2023-01-24 (×3): qty 1

## 2023-01-24 MED ORDER — PHENYLEPHRINE 80 MCG/ML (10ML) SYRINGE FOR IV PUSH (FOR BLOOD PRESSURE SUPPORT)
PREFILLED_SYRINGE | INTRAVENOUS | Status: AC
Start: 2023-01-24 — End: ?
  Filled 2023-01-24: qty 10

## 2023-01-24 MED ORDER — ALUM & MAG HYDROXIDE-SIMETH 200-200-20 MG/5ML PO SUSP: 30.0000 mL | ORAL | Status: DC | PRN

## 2023-01-24 MED ORDER — HYDROMORPHONE HCL 1 MG/ML IJ SOLN
INTRAMUSCULAR | Status: AC
  Filled 2023-01-24: qty 1

## 2023-01-24 MED ORDER — ACETAMINOPHEN 500 MG PO TABS
1000.0000 mg | ORAL_TABLET | ORAL | Status: AC
  Administered 2023-01-24: 1000 mg via ORAL

## 2023-01-24 MED ORDER — AMLODIPINE BESYLATE 5 MG PO TABS
5.0000 mg | ORAL_TABLET | Freq: Every day | ORAL | Status: DC
  Administered 2023-01-25: 5 mg via ORAL
  Filled 2023-01-24: qty 1

## 2023-01-24 MED ORDER — LACTATED RINGERS IV SOLN: INTRAVENOUS | Status: DC

## 2023-01-24 MED ORDER — FENTANYL CITRATE (PF) 100 MCG/2ML IJ SOLN
INTRAMUSCULAR | Status: DC | PRN
  Administered 2023-01-24: 50 ug via INTRAVENOUS
  Administered 2023-01-24: 100 ug via INTRAVENOUS
  Administered 2023-01-24: 50 ug via INTRAVENOUS

## 2023-01-24 MED ORDER — PROPOFOL 10 MG/ML IV BOLUS
INTRAVENOUS | Status: AC
Start: 2023-01-24 — End: ?
  Filled 2023-01-24: qty 20

## 2023-01-24 MED ORDER — SODIUM CHLORIDE 0.9 % IV SOLN: INTRAVENOUS | Status: DC

## 2023-01-24 MED ORDER — ONDANSETRON HCL 4 MG/2ML IJ SOLN
INTRAMUSCULAR | Status: DC | PRN
  Administered 2023-01-24: 4 mg via INTRAVENOUS

## 2023-01-24 MED ORDER — SODIUM CHLORIDE 0.9 % IR SOLN
Status: DC | PRN
  Administered 2023-01-24: 1000 mL
  Administered 2023-01-24: 2000 mL

## 2023-01-24 MED ORDER — ONDANSETRON HCL 4 MG/2ML IJ SOLN: 4.0000 mg | Freq: Once | INTRAMUSCULAR | Status: DC | PRN

## 2023-01-24 MED ORDER — ZOLPIDEM TARTRATE 5 MG PO TABS
5.0000 mg | ORAL_TABLET | Freq: Every evening | ORAL | Status: DC | PRN
  Administered 2023-01-25: 5 mg via ORAL
  Filled 2023-01-24: qty 1

## 2023-01-24 MED ORDER — ONDANSETRON HCL 4 MG/2ML IJ SOLN
INTRAMUSCULAR | Status: AC
  Filled 2023-01-24: qty 2

## 2023-01-24 MED ORDER — ROCURONIUM BROMIDE 10 MG/ML (PF) SYRINGE
PREFILLED_SYRINGE | INTRAVENOUS | Status: DC | PRN
  Administered 2023-01-24: 10 mg via INTRAVENOUS
  Administered 2023-01-24: 70 mg via INTRAVENOUS
  Administered 2023-01-24: 10 mg via INTRAVENOUS

## 2023-01-24 MED ORDER — ONDANSETRON HCL 4 MG/2ML IJ SOLN: 4.0000 mg | Freq: Four times a day (QID) | INTRAMUSCULAR | Status: DC | PRN

## 2023-01-24 MED ORDER — CEFAZOLIN SODIUM-DEXTROSE 2-4 GM/100ML-% IV SOLN
INTRAVENOUS | Status: AC
  Filled 2023-01-24: qty 100

## 2023-01-24 MED ORDER — MENTHOL 3 MG MT LOZG: 1.0000 | LOZENGE | OROMUCOSAL | Status: DC | PRN

## 2023-01-24 MED ORDER — LIDOCAINE HCL (PF) 2 % IJ SOLN
INTRAMUSCULAR | Status: AC
  Filled 2023-01-24: qty 10

## 2023-01-24 MED ORDER — DROPERIDOL 2.5 MG/ML IJ SOLN
INTRAMUSCULAR | Status: DC | PRN
  Administered 2023-01-24: .625 mg via INTRAVENOUS

## 2023-01-24 MED ORDER — PANTOPRAZOLE SODIUM 40 MG PO TBEC
40.0000 mg | DELAYED_RELEASE_TABLET | Freq: Every day | ORAL | Status: DC
  Administered 2023-01-25: 40 mg via ORAL
  Filled 2023-01-24: qty 1

## 2023-01-24 MED ORDER — IBUPROFEN 200 MG PO TABS
600.0000 mg | ORAL_TABLET | Freq: Four times a day (QID) | ORAL | Status: DC
  Administered 2023-01-24 – 2023-01-25 (×4): 600 mg via ORAL
  Filled 2023-01-24 (×4): qty 1

## 2023-01-24 MED ORDER — ENOXAPARIN SODIUM 30 MG/0.3ML IJ SOSY
30.0000 mg | PREFILLED_SYRINGE | INTRAMUSCULAR | Status: DC
  Filled 2023-01-24: qty 0.3

## 2023-01-24 MED ORDER — ONDANSETRON HCL 4 MG PO TABS: 4.0000 mg | ORAL_TABLET | Freq: Four times a day (QID) | ORAL | Status: DC | PRN

## 2023-01-24 MED ORDER — ORAL CARE MOUTH RINSE: 15.0000 mL | Freq: Once | OROMUCOSAL | Status: DC

## 2023-01-24 MED ORDER — FLUORESCEIN SODIUM 10 % IV SOLN
INTRAVENOUS | Status: AC
Start: 2023-01-24 — End: ?
  Filled 2023-01-24: qty 5

## 2023-01-24 MED ORDER — ROCURONIUM BROMIDE 10 MG/ML (PF) SYRINGE
PREFILLED_SYRINGE | INTRAVENOUS | Status: AC
  Filled 2023-01-24: qty 10

## 2023-01-24 MED ORDER — HYDROMORPHONE HCL 1 MG/ML IJ SOLN
INTRAMUSCULAR | Status: DC | PRN
  Administered 2023-01-24 (×2): .5 mg via INTRAVENOUS

## 2023-01-24 MED ORDER — KETOROLAC TROMETHAMINE 30 MG/ML IJ SOLN
INTRAMUSCULAR | Status: DC | PRN
  Administered 2023-01-24: 30 mg via INTRAVENOUS

## 2023-01-24 MED ORDER — PHENYLEPHRINE 80 MCG/ML (10ML) SYRINGE FOR IV PUSH (FOR BLOOD PRESSURE SUPPORT)
PREFILLED_SYRINGE | INTRAVENOUS | Status: AC
  Filled 2023-01-24: qty 10

## 2023-01-24 MED ORDER — BUPIVACAINE HCL (PF) 0.25 % IJ SOLN
INTRAMUSCULAR | Status: DC | PRN
  Administered 2023-01-24: 30 mL

## 2023-01-24 MED ORDER — OXYCODONE HCL 5 MG PO TABS
5.0000 mg | ORAL_TABLET | ORAL | Status: DC | PRN
  Administered 2023-01-24: 5 mg via ORAL
  Administered 2023-01-24 – 2023-01-25 (×3): 10 mg via ORAL
  Filled 2023-01-24 (×3): qty 2
  Filled 2023-01-24: qty 1

## 2023-01-24 MED ORDER — SENNOSIDES-DOCUSATE SODIUM 8.6-50 MG PO TABS
1.0000 | ORAL_TABLET | Freq: Every evening | ORAL | Status: DC | PRN
Start: 2023-01-24 — End: 2023-01-25
  Administered 2023-01-24: 1 via ORAL
  Filled 2023-01-24: qty 1

## 2023-01-24 MED ORDER — LIDOCAINE 2% (20 MG/ML) 5 ML SYRINGE
INTRAMUSCULAR | Status: DC | PRN
  Administered 2023-01-24: 100 mg via INTRAVENOUS

## 2023-01-24 MED ORDER — OXYCODONE HCL 5 MG PO TABS
5.0000 mg | ORAL_TABLET | Freq: Once | ORAL | Status: AC | PRN
  Administered 2023-01-24: 5 mg via ORAL

## 2023-01-24 MED ORDER — SUGAMMADEX SODIUM 200 MG/2ML IV SOLN
INTRAVENOUS | Status: DC | PRN
  Administered 2023-01-24: 200 mg via INTRAVENOUS

## 2023-01-24 MED ORDER — PROPOFOL 10 MG/ML IV BOLUS
INTRAVENOUS | Status: AC
  Filled 2023-01-24: qty 20

## 2023-01-24 MED ORDER — DEXAMETHASONE SODIUM PHOSPHATE 10 MG/ML IJ SOLN
INTRAMUSCULAR | Status: DC | PRN
  Administered 2023-01-24: 5 mg via INTRAVENOUS

## 2023-01-24 MED ORDER — ARTIFICIAL TEARS OPHTHALMIC OINT
TOPICAL_OINTMENT | OPHTHALMIC | Status: AC
  Filled 2023-01-24: qty 3.5

## 2023-01-24 MED ORDER — HYDROMORPHONE HCL 1 MG/ML IJ SOLN
0.2500 mg | INTRAMUSCULAR | Status: DC | PRN
  Administered 2023-01-24 (×2): 0.5 mg via INTRAVENOUS

## 2023-01-24 MED ORDER — PROPOFOL 500 MG/50ML IV EMUL
INTRAVENOUS | Status: DC | PRN
  Administered 2023-01-24: 25 ug/kg/min via INTRAVENOUS

## 2023-01-24 MED ORDER — MIDAZOLAM HCL 2 MG/2ML IJ SOLN
INTRAMUSCULAR | Status: AC
Start: 2023-01-24 — End: ?
  Filled 2023-01-24: qty 2

## 2023-01-24 MED ORDER — FLUORESCEIN SODIUM 10 % IV SOLN
INTRAVENOUS | Status: DC | PRN
  Administered 2023-01-24: 25 mg via INTRAVENOUS

## 2023-01-24 MED ORDER — PROPOFOL 10 MG/ML IV BOLUS
INTRAVENOUS | Status: DC | PRN
  Administered 2023-01-24: 20 mg via INTRAVENOUS
  Administered 2023-01-24: 200 mg via INTRAVENOUS

## 2023-01-24 MED ORDER — DEXMEDETOMIDINE HCL IN NACL 80 MCG/20ML IV SOLN
INTRAVENOUS | Status: DC | PRN
  Administered 2023-01-24: 12 ug via INTRAVENOUS

## 2023-01-24 MED ORDER — DROPERIDOL 2.5 MG/ML IJ SOLN
INTRAMUSCULAR | Status: AC
  Filled 2023-01-24: qty 2

## 2023-01-24 MED ORDER — ACETAMINOPHEN 500 MG PO TABS
ORAL_TABLET | ORAL | Status: AC
Start: 2023-01-24 — End: ?
  Filled 2023-01-24: qty 2

## 2023-01-24 MED ORDER — DEXAMETHASONE SODIUM PHOSPHATE 10 MG/ML IJ SOLN
INTRAMUSCULAR | Status: AC
  Filled 2023-01-24: qty 1

## 2023-01-24 MED ORDER — OXYCODONE HCL 5 MG PO TABS
ORAL_TABLET | ORAL | Status: AC
  Filled 2023-01-24: qty 1

## 2023-01-24 MED ORDER — MIDAZOLAM HCL 5 MG/5ML IJ SOLN
INTRAMUSCULAR | Status: DC | PRN
  Administered 2023-01-24: 2 mg via INTRAVENOUS

## 2023-01-24 MED ORDER — HYDROMORPHONE HCL 2 MG/ML IJ SOLN
INTRAMUSCULAR | Status: AC
  Filled 2023-01-24: qty 1

## 2023-01-24 MED ORDER — ACETAMINOPHEN 500 MG PO TABS
1000.0000 mg | ORAL_TABLET | Freq: Four times a day (QID) | ORAL | Status: DC
  Administered 2023-01-24 – 2023-01-25 (×4): 1000 mg via ORAL
  Filled 2023-01-24 (×4): qty 2

## 2023-01-24 MED ORDER — TRANEXAMIC ACID-NACL 1000-0.7 MG/100ML-% IV SOLN
1000.0000 mg | Freq: Once | INTRAVENOUS | Status: AC
  Administered 2023-01-24: 1000 mg via INTRAVENOUS

## 2023-01-24 MED ORDER — POVIDONE-IODINE 10 % EX SWAB: 2.0000 | Freq: Once | CUTANEOUS | Status: DC

## 2023-01-24 MED ORDER — OXYCODONE HCL 5 MG/5ML PO SOLN: 5.0000 mg | Freq: Once | ORAL | Status: AC | PRN

## 2023-01-24 MED ORDER — KETOROLAC TROMETHAMINE 30 MG/ML IJ SOLN: 30.0000 mg | Freq: Once | INTRAMUSCULAR | Status: DC

## 2023-01-24 MED ORDER — TRANEXAMIC ACID-NACL 1000-0.7 MG/100ML-% IV SOLN
INTRAVENOUS | Status: AC
  Filled 2023-01-24: qty 100

## 2023-01-24 SURGICAL SUPPLY — 72 items
APPLICATOR COTTON TIP 6 STRL (MISCELLANEOUS) IMPLANT
APPLICATOR COTTON TIP 6IN STRL (MISCELLANEOUS) ×12
BARRIER ADHS 3X4 INTERCEED (GAUZE/BANDAGES/DRESSINGS) IMPLANT
BENZOIN TINCTURE PRP APPL 2/3 (GAUZE/BANDAGES/DRESSINGS) IMPLANT
CABLE HIGH FREQUENCY MONO STRZ (ELECTRODE) IMPLANT
CELL SAVER LIPIGURD (MISCELLANEOUS) IMPLANT
CLOTH BEACON ORANGE TIMEOUT ST (SAFETY) ×2 IMPLANT
COVER BACK TABLE 60X90IN (DRAPES) ×2 IMPLANT
COVER MAYO STAND STRL (DRAPES) ×2 IMPLANT
COVER SURGICAL LIGHT HANDLE (MISCELLANEOUS) ×2 IMPLANT
DERMABOND ADVANCED .7 DNX12 (GAUZE/BANDAGES/DRESSINGS) ×2 IMPLANT
DEVICE RETRIEVAL ALEXIS 14 (MISCELLANEOUS) IMPLANT
DEVICE SUTURE ENDOST 10MM (ENDOMECHANICALS) IMPLANT
DRAPE SURG IRRIG POUCH 19X23 (DRAPES) ×2 IMPLANT
DRSG OPSITE POSTOP 4X10 (GAUZE/BANDAGES/DRESSINGS) IMPLANT
DRSG TEGADERM 8X12 (GAUZE/BANDAGES/DRESSINGS) IMPLANT
DURAPREP 26ML APPLICATOR (WOUND CARE) ×2 IMPLANT
EXTRT SYSTEM ALEXIS 14CM (MISCELLANEOUS)
EXTRT SYSTEM ALEXIS 17CM (MISCELLANEOUS)
GAUZE 4X4 16PLY ~~LOC~~+RFID DBL (SPONGE) ×4 IMPLANT
GLOVE BIO SURGEON STRL SZ 6.5 (GLOVE) ×4 IMPLANT
GOWN STRL REUS W/TWL LRG LVL3 (GOWN DISPOSABLE) ×4 IMPLANT
HARMONIC RUM II 2.5CM SILVER (DISPOSABLE) ×2
HARMONIC RUM II 3.0CM SILVER (DISPOSABLE) ×2
HEMOSTAT ARISTA ABSORB 3G PWDR (HEMOSTASIS) IMPLANT
IRRIG SUCT STRYKERFLOW 2 WTIP (MISCELLANEOUS) ×2
IRRIGATION SUCT STRKRFLW 2 WTP (MISCELLANEOUS) ×2 IMPLANT
IV NS 1000ML BAXH (IV SOLUTION) IMPLANT
KIT PINK PAD W/HEAD ARE REST (MISCELLANEOUS) ×2
KIT PINK PAD W/HEAD ARM REST (MISCELLANEOUS) ×2 IMPLANT
KIT TURNOVER CYSTO (KITS) ×2 IMPLANT
L-HOOK LAP DISP 36CM (ELECTROSURGICAL) ×2
LHOOK LAP DISP 36CM (ELECTROSURGICAL) IMPLANT
MANIPULATOR UTERINE 7CM CLEARV (MISCELLANEOUS) IMPLANT
MANIPULATOR VCARE SML CRV RETR (MISCELLANEOUS) IMPLANT
NS IRRIG 1000ML POUR BTL (IV SOLUTION) ×2 IMPLANT
OCCLUDER COLPOPNEUMO (BALLOONS) ×2 IMPLANT
PACK LAPAROSCOPY BASIN (CUSTOM PROCEDURE TRAY) ×2 IMPLANT
PENCIL BUTTON HOLSTER BLD 10FT (ELECTRODE) IMPLANT
SCALPEL HRMNC RUM II 2.5 SILVR (DISPOSABLE) IMPLANT
SCALPEL HRMNC RUM II 3.0 SILVR (DISPOSABLE) IMPLANT
SCISSORS LAP 5X35 DISP (ENDOMECHANICALS) IMPLANT
SET IRRIG Y TYPE TUR BLADDER L (SET/KITS/TRAYS/PACK) IMPLANT
SET TRI-LUMEN FLTR TB AIRSEAL (TUBING) ×2 IMPLANT
SHEARS HARMONIC 36 ACE (MISCELLANEOUS) IMPLANT
SLEEVE SCD COMPRESS KNEE MED (STOCKING) ×2 IMPLANT
STRIP CLOSURE SKIN 1/2X4 (GAUZE/BANDAGES/DRESSINGS) IMPLANT
SUT ENDO VLOC 180-0-8IN (SUTURE) IMPLANT
SUT PLAIN 2 0 XLH (SUTURE) IMPLANT
SUT VIC AB 0 CT1 27XBRD ANBCTR (SUTURE) ×4 IMPLANT
SUT VIC AB 0 CT1 27XCR 8 STRN (SUTURE) IMPLANT
SUT VIC AB 2-0 CT1 (SUTURE) IMPLANT
SUT VIC AB 4-0 KS 27 (SUTURE) IMPLANT
SUT VIC AB 4-0 PS2 18 (SUTURE) ×2 IMPLANT
SUT VIC AB 4-0 PS2 27 (SUTURE) ×2 IMPLANT
SUT VICRYL 0 UR6 27IN ABS (SUTURE) ×2 IMPLANT
SYR 10ML LL (SYRINGE) IMPLANT
SYR 50ML LL SCALE MARK (SYRINGE) ×2 IMPLANT
SYS WOUND ALEXIS 18CM MED (MISCELLANEOUS) ×2
SYSTEM CARTER THOMASON II (TROCAR) ×2 IMPLANT
SYSTEM CONTND EXTRCTN KII BLLN (MISCELLANEOUS) IMPLANT
SYSTEM WOUND ALEXIS 18CM MED (MISCELLANEOUS) IMPLANT
TIP UTERINE 5.1X6CM LAV DISP (MISCELLANEOUS) IMPLANT
TIP UTERINE 6.7X10CM GRN DISP (MISCELLANEOUS) IMPLANT
TIP UTERINE 6.7X6CM WHT DISP (MISCELLANEOUS) IMPLANT
TIP UTERINE 6.7X8CM BLUE DISP (MISCELLANEOUS) IMPLANT
TOWEL OR 17X24 6PK STRL BLUE (TOWEL DISPOSABLE) ×4 IMPLANT
TRAY FOLEY W/BAG SLVR 14FR LF (SET/KITS/TRAYS/PACK) ×2 IMPLANT
TROCAR PORT AIRSEAL 5X120 (TROCAR) IMPLANT
TROCAR Z-THREAD FIOS 11X100 BL (TROCAR) ×2 IMPLANT
TROCAR Z-THREAD FIOS 5X100MM (TROCAR) ×2 IMPLANT
WARMER LAPAROSCOPE (MISCELLANEOUS) ×2 IMPLANT

## 2023-01-24 NOTE — Op Note (Addendum)
Operative Note    Preoperative Diagnosis Menorrhagia  Uterine leiomyomata Previous myomectomy x 2  Postoperative Diagnosis: Same  4. Intraabdominal adhesions    Procedure: Laparoscopic supracervical hysterectomy with minilaparotomy, bilateral salpingectomy, lysis of adhesions and cystoscopy    Surgeon: Britt Bottom DO  Assist: Orville Govern MD     An experienced assistant was required given the standard of surgical care given the complexity of the case and maternal body habitus.  This assistant was needed for exposure, dissection, suctioning, retraction, instrument exchange, and for overall help during the procedure.    Anesthesia: General   Fluids: LR 1.54ml EBL: 50ml UOP: 75ml Meds: Ancef 2gm and 1000mg  TXA  Findings: Multilobular uterus with leiomyomata, adherent right adnexal to lateral sidewall and to posterior cul de sac and bowel , grossly normal fallopian tubes bilaterally, slightly enlarged right ovary ( 4.5cm) , grossly normal left ovary ( 3cm)    Specimen: Uterus, bilateral fallopian tubes to pathology    Procedure Note  Pt seen in pre-op. Procedure reviewed and all questions answered; consent verified  Pt was taken to the operating room. She was assessed and general anesthesia safely administered.  Pt was then placed in the dorsal lithotomy position with her arms safely positioned at her sides.  Pt was prepped and draped in sterile fashion and a timeout performed.  An exam under anesthesia noted the anteverted irregular shaped uterus with a small vaginal vault  A speculum was placed and the anterior lip of the cervix grasped with a single toothed tenaculum.  The uterus was sounded to 7cm so a size 6 koh was initially attempted however this was changed to the v-care for better manipulation.  A foley catheter then also placed in a sterile fashion Her legs were then lowered and attention turned to her abdomen.  Here a 5mm incision was made at the umbilicus after injecting  with 0.25% plain marcaine. A 5mm optiview trocar and camera were then placed with the abdomen tented upwards. When the placement was confirmed to be appropriate, pneumoperitoneum was attained with CO2 gas to . The patient was placed in trendelenberg and gross survey of pelvis done.The uterus was noted as described below  Multilobular uterus with leiomyomata, adherent right adnexal to lateral sidewall and to posterior cul de sac and bowel , grossly normal fallopian tubes bilaterally, slightly enlarged right ovary ( 4.5cm) , grossly normal left ovary ( 3cm) .    At this time a 5mm airseal port was then placed under direct visualization in the right lower quadrant and an 11mm port site in the left. Care was taken to avoid the epigastric vessels. Both ureters were visualized along lateral sidewalls.  Starting on the the patients right, the fallopian tube was grasped, elevated and excised using the harmonic hemostatically. Next several adhesions were excised thus freeing the ovary from the lateral sidewall and the bowel. Next, the utero-ovarian ligament and the round ligaments were sequentially grasped and excised. The broad ligament was then separated from the uterus sequentially and a bladder flap created.  Attention was then turned to the patients left side where the fallopian tube was similarly excised till the cornua of the uterus, the utero-ovarian ligament separated and the broad ligament taken down medially to the utero-cervical junction. Small bleeding sites were cauterized. The bladder reflection was dissected down then the bladder retro - filled with dyed saline to confirm integrity.   At this time the adhesions posteriorly in the cul de sac were reassessed and with concern  for bowel injury, and concern about the uterus size being able to be safely removed vaginally via morcellation, decision was made to complete the case via a mini laparotomy.    Thus, the patient was flattened at this time  and the 11mm port site closed with 0-vicryl suture using a carter thomasen device.  The remaining  trocars were removed under direct visualization and gas allowed to escape via the airseal being turned off.  A pfannestial incision was then made through her previous incision and carried down to the level of the fascia via sharp dissection and cautery. There were dense adhesions noted in the mid portion of the fascia.   The fascia was nicked in the midline and extended laterally using mayo scissors. The superior, then inferior, aspects of the fascia were then grasped with kelly clamps and the underlying rectus muscles separated.  The rectus muscles were separated and the abdomen entered. An alexis retractor was placed and bowel packed with lap sponges to effect visualization. The right adnexa was reexamined and a lateral fibroid noted. The broad ligament was further dissected to include the fibroid and to complete the separation from the lateral side wall. Adhesions were excised hemostatically freeing the right side.  Next, using kelly clamps and cautery,  the uterus was amputated from the cervix . Three interrupted figure 8 sutures were placed to close the internal os. A small bleeding site was suture ligated and great hemostasis appreciated. Arista was applied.   At this time, with gross survey confirming hemostasis, the fascia was closed using 0-vicryl suture in a running fashion. Subcutaneous fat layer was also closed with 3-0 monocryl. The incision was closed using a 4-0 vicryl on a keith needle. The laparoscopy trocar sites were also closed with 4-0 vicryl suture. Dermabond was applied.   Next, a cystoscopy was performed. 1cc of florescein was administered a few minutes prior by anesthesia. Ureteral jets were easily noted from both sides.The cystoscopy was completed and foley catheter replaced.  All instrument and sponge counts were noted to be accurate per nursing staff.  Patient was awakened and  taken to recovery room in stable status.

## 2023-01-24 NOTE — Transfer of Care (Signed)
Immediate Anesthesia Transfer of Care Note  Patient: Christine Best  Procedure(s) Performed: TOTAL LAPAROSCOPIC Supracervacal HYSTERECTOMY WITH SALPINGECTOMY, Mini lapaarotomy (Bilateral) CYSTOSCOPY  Patient Location: PACU  Anesthesia Type:General  Level of Consciousness: awake, alert , oriented, and patient cooperative  Airway & Oxygen Therapy: Patient Spontanous Breathing and Patient connected to nasal cannula oxygen  Post-op Assessment: Report given to RN and Post -op Vital signs reviewed and stable  Post vital signs: Reviewed and stable  Last Vitals:  Vitals Value Taken Time  BP 132/79 01/24/23 1118  Temp 37.2 C 01/24/23 1118  Pulse 101 01/24/23 1121  Resp 15 01/24/23 1121  SpO2 100 % 01/24/23 1121  Vitals shown include unfiled device data.  Last Pain:  Vitals:   01/24/23 0601  TempSrc: Oral      Patients Stated Pain Goal: 3 (01/24/23 0601)  Complications: No notable events documented.

## 2023-01-24 NOTE — Anesthesia Postprocedure Evaluation (Signed)
Anesthesia Post Note  Patient: Christine Best  Procedure(s) Performed: TOTAL LAPAROSCOPIC Supracervacal HYSTERECTOMY WITH SALPINGECTOMY, Mini lapaarotomy,lysis of adhesions (Bilateral) CYSTOSCOPY     Patient location during evaluation: PACU Anesthesia Type: General Level of consciousness: awake and alert Pain management: pain level controlled Vital Signs Assessment: post-procedure vital signs reviewed and stable Respiratory status: spontaneous breathing, nonlabored ventilation, respiratory function stable and patient connected to nasal cannula oxygen Cardiovascular status: blood pressure returned to baseline and stable Postop Assessment: no apparent nausea or vomiting Anesthetic complications: no  No notable events documented.  Last Vitals:  Vitals:   01/24/23 1401 01/24/23 1448  BP: 129/75 134/83  Pulse: 80 75  Resp: 15 17  Temp: (!) 36.4 C (!) 36.3 C  SpO2: 99% 98%    Last Pain:  Vitals:   01/24/23 1448  TempSrc: Oral  PainSc:                  Graceann Boileau S

## 2023-01-24 NOTE — Anesthesia Procedure Notes (Signed)
Procedure Name: Intubation Date/Time: 01/24/2023 7:42 AM  Performed by: Bishop Limbo, CRNAPre-anesthesia Checklist: Patient identified, Emergency Drugs available, Suction available and Patient being monitored Patient Re-evaluated:Patient Re-evaluated prior to induction Oxygen Delivery Method: Circle System Utilized Preoxygenation: Pre-oxygenation with 100% oxygen Induction Type: IV induction Ventilation: Mask ventilation without difficulty Laryngoscope Size: Mac and 3 Grade View: Grade I Tube type: Oral Tube size: 7.0 mm Number of attempts: 1 Airway Equipment and Method: Stylet and Bite block Placement Confirmation: ETT inserted through vocal cords under direct vision, positive ETCO2 and breath sounds checked- equal and bilateral Secured at: 22 cm Tube secured with: Tape Dental Injury: Teeth and Oropharynx as per pre-operative assessment

## 2023-01-24 NOTE — Interval H&P Note (Signed)
History and Physical Interval Note: No change since H/P done Reviewed expectations Consent confirmed To OR when ready  01/24/2023 7:29 AM  Christine Best  has presented today for surgery, with the diagnosis of uterine leiomyoma , Menorrhagia.  The various methods of treatment have been discussed with the patient and family. After consideration of risks, benefits and other options for treatment, the patient has consented to  Procedure(s): TOTAL LAPAROSCOPIC HYSTERECTOMY WITH SALPINGECTOMY (Bilateral) CYSTOSCOPY (N/A) as a surgical intervention.  The patient's history has been reviewed, patient examined, no change in status, stable for surgery.  I have reviewed the patient's chart and labs.  Questions were answered to the patient's satisfaction.     Cathrine Muster

## 2023-01-24 NOTE — Anesthesia Preprocedure Evaluation (Signed)
Anesthesia Evaluation  Patient identified by MRN, date of birth, ID band Patient awake    Reviewed: Allergy & Precautions, H&P , NPO status , Patient's Chart, lab work & pertinent test results  Airway Mallampati: II  TM Distance: >3 FB Neck ROM: Full    Dental no notable dental hx.    Pulmonary neg pulmonary ROS   Pulmonary exam normal breath sounds clear to auscultation       Cardiovascular hypertension, Normal cardiovascular exam Rhythm:Regular Rate:Normal     Neuro/Psych negative neurological ROS  negative psych ROS   GI/Hepatic negative GI ROS, Neg liver ROS,,,  Endo/Other  diabetes, Type 2  Class 3 obesity  Renal/GU negative Renal ROS  negative genitourinary   Musculoskeletal negative musculoskeletal ROS (+)    Abdominal   Peds negative pediatric ROS (+)  Hematology  (+) Blood dyscrasia, anemia   Anesthesia Other Findings   Reproductive/Obstetrics negative OB ROS                             Anesthesia Physical Anesthesia Plan  ASA: 3  Anesthesia Plan: General   Post-op Pain Management: Tylenol PO (pre-op)*   Induction: Intravenous  PONV Risk Score and Plan: 3 and Ondansetron, Dexamethasone, Droperidol, Treatment may vary due to age or medical condition and Scopolamine patch - Pre-op  Airway Management Planned: Oral ETT  Additional Equipment:   Intra-op Plan:   Post-operative Plan: Extubation in OR  Informed Consent: I have reviewed the patients History and Physical, chart, labs and discussed the procedure including the risks, benefits and alternatives for the proposed anesthesia with the patient or authorized representative who has indicated his/her understanding and acceptance.     Dental advisory given  Plan Discussed with: CRNA and Surgeon  Anesthesia Plan Comments:        Anesthesia Quick Evaluation

## 2023-01-24 NOTE — Discharge Instructions (Signed)
Call office with any concerns [phone] 

## 2023-01-24 NOTE — Progress Notes (Signed)
Day of Surgery Procedure(s) (LRB): TOTAL LAPAROSCOPIC Supracervacal HYSTERECTOMY WITH SALPINGECTOMY, Mini lapaarotomy,lysis of adhesions (Bilateral) CYSTOSCOPY (N/A)  Subjective: Patient reports incisional pain and tolerating PO.  She denies CP, SOB or fever. Pain controlled with meds. No complaints at this time   Objective: I have reviewed patient's vital signs, intake and output, and medications.  General: alert, cooperative, and mild distress GI: incision: clean, dry, and intact Extremities: Homans sign is negative, no sign of DVT  Assessment: s/p Procedure(s): TOTAL LAPAROSCOPIC Supracervacal HYSTERECTOMY WITH SALPINGECTOMY, Mini lapaarotomy,lysis of adhesions (Bilateral) CYSTOSCOPY (N/A): stable  Plan: Routine post op care Discontinue foley in am Advance care in am Pain control overnight Reviewed surgical pics and explained procedure. All questions answered   LOS: 0 days    Christine Kurt W Joud Pettinato, DO 01/24/2023, 5:36 PM

## 2023-01-25 ENCOUNTER — Other Ambulatory Visit: Payer: Self-pay

## 2023-01-25 ENCOUNTER — Other Ambulatory Visit (HOSPITAL_COMMUNITY): Payer: Self-pay

## 2023-01-25 LAB — CBC
HCT: 25.4 % — ABNORMAL LOW (ref 36.0–46.0)
Hemoglobin: 7.3 g/dL — ABNORMAL LOW (ref 12.0–15.0)
MCH: 20.3 pg — ABNORMAL LOW (ref 26.0–34.0)
MCHC: 28.7 g/dL — ABNORMAL LOW (ref 30.0–36.0)
MCV: 70.6 fL — ABNORMAL LOW (ref 80.0–100.0)
Platelets: 352 10*3/uL (ref 150–400)
RBC: 3.6 MIL/uL — ABNORMAL LOW (ref 3.87–5.11)
RDW: 17 % — ABNORMAL HIGH (ref 11.5–15.5)
WBC: 10.8 10*3/uL — ABNORMAL HIGH (ref 4.0–10.5)
nRBC: 0 % (ref 0.0–0.2)

## 2023-01-25 LAB — SURGICAL PATHOLOGY

## 2023-01-25 MED ORDER — OXYCODONE-ACETAMINOPHEN 5-325 MG PO TABS
1.0000 | ORAL_TABLET | ORAL | 0 refills | Status: AC | PRN
  Filled 2023-01-25: qty 28, 5d supply, fill #0

## 2023-01-25 MED ORDER — IBUPROFEN 600 MG PO TABS
600.0000 mg | ORAL_TABLET | Freq: Four times a day (QID) | ORAL | 1 refills | Status: DC | PRN
  Filled 2023-01-25: qty 40, 10d supply, fill #0

## 2023-01-25 MED ORDER — IRON (FERROUS SULFATE) 325 (65 FE) MG PO TABS
1.0000 | ORAL_TABLET | ORAL | 1 refills | Status: DC
  Filled 2023-01-25: qty 60, 120d supply, fill #0

## 2023-01-25 NOTE — Progress Notes (Signed)
1 Day Post-Op Procedure(s) (LRB): TOTAL LAPAROSCOPIC Supracervacal HYSTERECTOMY WITH SALPINGECTOMY, Mini lapaarotomy,lysis of adhesions (Bilateral) CYSTOSCOPY (N/A)  Subjective: Patient reports incisional pain, tolerating PO, + flatus, and no problems voiding.  She reports pain well controlled with meds. She has ambulated with no lightheadedness. Denies CP, SOB. She would like to be discharged to home today.   Objective: I have reviewed patient's vital signs, intake and output, medications, and labs.  General: alert, cooperative, and no distress GI: incision: clean, dry, intact, and abdomen tender as expected, ND , soft Extremities: Homans sign is negative, no sign of DVT  Assessment: s/p Procedure(s): TOTAL LAPAROSCOPIC Supracervacal HYSTERECTOMY WITH SALPINGECTOMY, Mini lapaarotomy,lysis of adhesions (Bilateral) CYSTOSCOPY (N/A): stable and anemia- of chronic blood loss not clinically significant and aymptomatic   Plan: Discharge home Instructions reviewed Discussed iron infusion prior to discharge but pt declines at this time - will use oral iron supps   LOS: 1 day    Rylah Fukuda W Tyniya Kuyper, DO 01/25/2023, 12:42 PM

## 2023-01-25 NOTE — TOC Initial Note (Signed)
Transition of Care [facility]) - Initial/Assessment Note    Patient Details  Name: Christine Best MRN: [id number] Date of Birth: 1975/08/14  Transition [facility] Care [facility] CM/SW Contact:    Adrian Prows, RN Phone Number: 01/25/2023, 10:50 AM  Clinical Narrative:                 Spoke w/ pt in room; pt says she lives at home w her husband, Vevelyn Royals [phone]); she plans to return at d/c; pt verified she has insurance/PCP; pt denies SDOH risks; she has transportation; pt says she does not have DME, HH services, or home oxygen; no TOC needs; TOC is will follow. Expected Discharge Plan: Home/Self Care Barriers to Discharge: Continued Medical Work up   Patient Goals and CMS Choice Patient states their goals for this hospitalization and ongoing recovery are:: home CMS Medicare.gov Compare Post Acute Care list provided to:: Patient        Expected Discharge Plan and Services   Discharge Planning Services: CM Consult   Living arrangements for the past 2 months: Single Family Home                                      Prior Living Arrangements/Services Living arrangements for the past 2 months: Single Family Home Lives with:: Spouse Patient language and need for interpreter reviewed:: Yes Do you feel safe going back to the place where you live?: Yes      Need for Family Participation in Patient Care: Yes (Comment) Care giver support system in place?: Yes (comment) Current home services:  (n/a) Criminal Activity/Legal Involvement Pertinent to Current Situation/Hospitalization: No - Comment as needed  Activities of Daily Living   ADL Screening (condition at time of admission) Independently performs ADLs?: Yes (appropriate for developmental age) Is the patient deaf or have difficulty hearing?: No Does the patient have difficulty seeing, even when wearing glasses/contacts?: No Does the patient have difficulty concentrating, remembering, or making decisions?:  No  Permission Sought/Granted Permission sought to share information with : Case Manager Permission granted to share information with : Yes, Verbal Permission Granted  Share Information with NAME: Case Manager     Permission granted to share info w Relationship: Vevelyn Royals (spouse) [phone]     Emotional Assessment Appearance:: Appears stated age Attitude/Demeanor/Rapport: Gracious Affect (typically observed): Accepting Orientation: : Oriented to Self, Oriented to Place, Oriented to  Time, Oriented to Situation Alcohol / Substance Use: Not Applicable Psych Involvement: No (comment)  Admission diagnosis:  Status post hysterectomy [Z90.710] Status post abdominal hysterectomy [Z90.710] Patient Active Problem List   Diagnosis Date Noted   Status post hysterectomy 01/24/2023   Status post abdominal hysterectomy 01/24/2023   Iron deficiency 05/22/2022   Dysmenorrhea 03/02/2022   Umbilical hernia 03/02/2022   Shortness of breath on exertion 09/03/2017   Type 2 diabetes mellitus with complication (HCC) 04/03/2017   Morbid obesity (HCC) 04/03/2017   Hypertension associated with diabetes (HCC) 04/03/2017   PCP:  Ardith Dark, MD Pharmacy:   [facility] [address] [place] [address] Phone: [phone] Fax: [phone]  [place] [facility] [address] [place] [address] Phone: [phone] Fax: [phone]     Social Determinants of Health (SDOH) Social History: SDOH Screenings   Food Insecurity: No Food Insecurity (01/25/2023)  Housing: Low Risk  (01/25/2023)  Transportation Needs: No  Transportation Needs (01/25/2023)  Utilities: Not At Risk (01/25/2023)  Depression (PHQ2-9): Low Risk  (09/25/2022)  Financial Resource Strain: Low Risk  (05/21/2022)  Physical Activity: Unknown (05/21/2022)  Social Connections: Moderately Integrated (05/21/2022)  Stress: No Stress Concern Present  (05/21/2022)  Tobacco Use: Low Risk  (01/24/2023)   SDOH Interventions: Food Insecurity Interventions: Intervention Not Indicated, Inpatient TOC Housing Interventions: Intervention Not Indicated, Inpatient TOC Transportation Interventions: Intervention Not Indicated, Inpatient TOC Utilities Interventions: Intervention Not Indicated, Inpatient TOC   Readmission Risk Interventions     No data to display

## 2023-01-25 NOTE — Discharge Summary (Signed)
Physician Discharge Summary  Patient ID: Christine Best MRN: [id number] DOB/AGE: [birth year] 47 y.o.  Admit date: 01/24/2023 Discharge date: 01/25/2023  Admission Diagnoses: Menorrhagia Uterine leiomyoma Anemia of chronic blood loss  Discharge Diagnoses:  Principal Problem:   Status post hysterectomy Active Problems:   Status post abdominal hysterectomy Anemia of chronic blood loss - not clinically significant   Discharged Condition: stable  Hospital Course: Pt did well on pain meds overnight. She was deemed stable for discharge to home today. Asymptomatic from anemia   Consults: None  Significant Diagnostic Studies: labs: Hg 7.3  Treatments: analgesia: acetaminophen w/ codeine, Morphine, and ibuprofen  Discharge Exam: Blood pressure 137/73, pulse 83, temperature 97.9 F (36.6 C), temperature source Oral, resp. rate 18, height 5\' 2"  (1.575 m), weight 111.4 kg, last menstrual period 01/02/2023, SpO2 99%. General appearance: alert, cooperative, and no distress GI: normal findings: soft, dressing c/d/I Extremities: Homans sign is negative, no sign of DVT  Disposition: Discharge disposition: 01-Home or Self Care       Discharge Instructions     Call MD for:  difficulty breathing, headache or visual disturbances   Complete by: As directed    Call MD for:  persistant dizziness or light-headedness   Complete by: As directed    Call MD for:  persistant nausea and vomiting   Complete by: As directed    Call MD for:  redness, tenderness, or signs of infection (pain, swelling, redness, odor or green/yellow discharge around incision site)   Complete by: As directed    Call MD for:  severe uncontrolled pain   Complete by: As directed    Call MD for:  temperature >100.4   Complete by: As directed    Diet - low sodium heart healthy   Complete by: As directed    Discharge instructions   Complete by: As directed    Call office with any concerns [phone]   Driving  Restrictions   Complete by: As directed    None while taking narcotic pain medicationl   If the dressing is still on your incision site when you go home, remove it on the third day after your surgery date. Remove dressing if it begins to fall off, or if it is dirty or damaged before the third day.   Complete by: As directed    Increase activity slowly   Complete by: As directed    Lifting restrictions   Complete by: As directed    <15lbs   Sexual Activity Restrictions   Complete by: As directed    None for 6weeks      Allergies as of 01/25/2023       Reactions   Lisinopril Anaphylaxis, Swelling, Other (See Comments)   angioedema        Medication List     TAKE these medications    amLODipine 5 MG tablet Commonly known as: NORVASC Take 1 tablet (5 mg total) by mouth daily.   Dexcom G6 Receiver Devi Use daily as needed to check blood sugar.   Dexcom G6 Sensor Misc Use daily as needed to check blood sugar.   ibuprofen 600 MG tablet Commonly known as: ADVIL Take 1 tablet (600 mg total) by mouth every 6 (six) hours as needed for moderate pain (pain score 4-6) or cramping.   Iron (Ferrous Sulfate) 325 (65 Fe) MG Tabs Take 1 tablet by mouth every other day.   oxyCODONE-acetaminophen 5-325 MG tablet Commonly known as: Percocet Take 1 tablet by mouth  every 4 (four) hours as needed for up to 7 days for severe pain (pain score 7-10).   Ozempic (2 MG/DOSE) 8 MG/3ML Sopn Generic drug: Semaglutide (2 MG/DOSE) Inject 2 mg into the skin once a week. What changed: additional instructions               Discharge Care Instructions  (From admission, onward)           Start     Ordered   01/25/23 0000  If the dressing is still on your incision site when you go home, remove it on the third day after your surgery date. Remove dressing if it begins to fall off, or if it is dirty or damaged before the third day.        01/25/23 1250            Follow-up  Information     Christine Areola, DO Follow up in 2 week(s).   Specialty: Obstetrics and Gynecology Why: For incision check and 6 weeks for post op visit Contact information: [address] [place] [address] [place] [address] [phone]                 Signed: Cathrine Muster 01/25/2023, 12:50 PM

## 2023-01-25 NOTE — Progress Notes (Signed)
RN read discharge instructions and pt verbalized understanding. Pt escorted via wheelchair to main entrance with family to private vehicle.

## 2023-01-25 NOTE — Progress Notes (Signed)
Nutrition Brief Note  RD consulted for nutritional assessment.  Spoke with patient at bedside, she is consuming 90% of her meals. Good appetite. States prior to her surgery she has been on Ozempic and this has lessened her appetite. She consumes 1 big meal most days. States protein causes her to become constipated. States on average she has a BM once every 2 weeks. Discussed if she was on a laxative regimen, states she was told by GI to take Miralax and Linzess? Reports she hasn't been taking this and could not give me a reason as to why. Pt not interested in high fiber information and not interested in protein shakes at this time.  Encouraged pt to try and drink more fluids to help with constipation.  Wt Readings from Last 15 Encounters:  01/24/23 111.4 kg  01/03/23 108.9 kg  12/20/22 108.9 kg  12/04/22 112.9 kg  09/25/22 111.9 kg  08/15/22 111.1 kg  07/31/22 110.7 kg  05/22/22 112.9 kg  04/18/22 113.5 kg  03/02/22 110.9 kg  07/05/21 114.4 kg  02/02/21 118.3 kg  06/16/20 124 kg  10/15/19 116.3 kg  08/03/19 134.3 kg    Body mass index is 44.94 kg/m. Patient meets criteria for morbid obesity based on current BMI.   Current diet order is regular, patient is consuming approximately 50% of meals at this time. Labs and medications reviewed.   No nutrition interventions warranted at this time. If other nutrition issues arise, please consult RD.   Tilda Franco, MS, RD, LDN Inpatient Clinical Dietitian Contact via Secure chat

## 2023-01-25 NOTE — TOC Transition Note (Signed)
Transition of Care [facility]) - CM/SW Discharge Note   Patient Details  Name: Christine Best MRN: [id number] Date of Birth: 1976/01/29  [facility] of [facility]) CM/SW Contact:  Adrian Prows, RN Phone Number: 01/25/2023, 12:53 PM   Clinical Narrative:    D/C orders received; no TOC needs.   Final next level of care: Home/Self Care Barriers to Discharge: No Barriers Identified   Patient Goals and CMS Choice CMS Medicare.gov Compare Post Acute Care list provided to:: Patient    Discharge Placement                         Discharge Plan and Services Additional resources added to the After Visit Summary for     Discharge Planning Services: CM Consult                                 Social Determinants of Health (SDOH) Interventions SDOH Screenings   Food Insecurity: No Food Insecurity (01/25/2023)  Housing: Low Risk  (01/25/2023)  Transportation Needs: No Transportation Needs (01/25/2023)  Utilities: Not At Risk (01/25/2023)  Depression (PHQ2-9): Low Risk  (09/25/2022)  Financial Resource Strain: Low Risk  (05/21/2022)  Physical Activity: Unknown (05/21/2022)  Social Connections: Moderately Integrated (05/21/2022)  Stress: No Stress Concern Present (05/21/2022)  Tobacco Use: Low Risk  (01/24/2023)     Readmission Risk Interventions     No data to display

## 2023-01-28 ENCOUNTER — Encounter (HOSPITAL_BASED_OUTPATIENT_CLINIC_OR_DEPARTMENT_OTHER): Payer: Self-pay | Admitting: Obstetrics and Gynecology

## 2023-01-30 ENCOUNTER — Other Ambulatory Visit: Payer: Self-pay

## 2023-02-04 ENCOUNTER — Other Ambulatory Visit (HOSPITAL_COMMUNITY): Payer: Self-pay

## 2023-02-05 ENCOUNTER — Other Ambulatory Visit (HOSPITAL_COMMUNITY): Payer: Self-pay

## 2023-02-05 MED ORDER — OXYCODONE-ACETAMINOPHEN 5-325 MG PO TABS
ORAL_TABLET | ORAL | 0 refills | Status: DC
  Filled 2023-02-05: qty 20, 5d supply, fill #0

## 2023-02-06 ENCOUNTER — Other Ambulatory Visit (HOSPITAL_COMMUNITY): Payer: Self-pay

## 2023-02-22 ENCOUNTER — Other Ambulatory Visit (HOSPITAL_COMMUNITY): Payer: Self-pay

## 2023-02-25 ENCOUNTER — Other Ambulatory Visit: Payer: Self-pay

## 2023-03-04 ENCOUNTER — Other Ambulatory Visit (HOSPITAL_COMMUNITY): Payer: Self-pay

## 2023-03-04 DIAGNOSIS — Z4889 Encounter for other specified surgical aftercare: Secondary | ICD-10-CM | POA: Diagnosis not present

## 2023-03-04 DIAGNOSIS — N760 Acute vaginitis: Secondary | ICD-10-CM | POA: Diagnosis not present

## 2023-03-04 DIAGNOSIS — N898 Other specified noninflammatory disorders of vagina: Secondary | ICD-10-CM | POA: Diagnosis not present

## 2023-03-04 MED ORDER — METRONIDAZOLE 500 MG PO TABS
ORAL_TABLET | ORAL | 0 refills | Status: DC
  Filled 2023-03-04: qty 14, 7d supply, fill #0

## 2023-03-08 ENCOUNTER — Other Ambulatory Visit (HOSPITAL_COMMUNITY): Payer: Self-pay

## 2023-03-12 ENCOUNTER — Other Ambulatory Visit (HOSPITAL_COMMUNITY): Payer: Self-pay

## 2023-03-12 MED ORDER — PREDNISONE 10 MG (21) PO TBPK
ORAL_TABLET | ORAL | 0 refills | Status: DC
  Filled 2023-03-12: qty 21, 6d supply, fill #0

## 2023-03-14 ENCOUNTER — Other Ambulatory Visit (HOSPITAL_COMMUNITY): Payer: Self-pay

## 2023-03-14 MED ORDER — FLUCONAZOLE 150 MG PO TABS
150.0000 mg | ORAL_TABLET | ORAL | 0 refills | Status: DC
  Filled 2023-03-14: qty 2, 6d supply, fill #0

## 2023-03-15 ENCOUNTER — Other Ambulatory Visit (HOSPITAL_COMMUNITY): Payer: Self-pay

## 2023-04-19 ENCOUNTER — Other Ambulatory Visit (HOSPITAL_COMMUNITY): Payer: Self-pay

## 2023-04-30 ENCOUNTER — Other Ambulatory Visit (HOSPITAL_COMMUNITY): Payer: Self-pay

## 2023-05-13 ENCOUNTER — Other Ambulatory Visit (HOSPITAL_COMMUNITY): Payer: Self-pay

## 2023-05-13 ENCOUNTER — Other Ambulatory Visit: Payer: Self-pay

## 2023-05-13 ENCOUNTER — Encounter: Payer: Self-pay | Admitting: Pharmacist

## 2023-05-14 ENCOUNTER — Other Ambulatory Visit (HOSPITAL_COMMUNITY): Payer: Self-pay

## 2023-05-16 ENCOUNTER — Other Ambulatory Visit: Payer: Self-pay

## 2023-05-20 ENCOUNTER — Other Ambulatory Visit (HOSPITAL_COMMUNITY): Payer: Self-pay

## 2023-05-23 ENCOUNTER — Other Ambulatory Visit (HOSPITAL_COMMUNITY): Payer: Self-pay

## 2023-06-03 ENCOUNTER — Other Ambulatory Visit (HOSPITAL_COMMUNITY): Payer: Self-pay

## 2023-06-06 IMAGING — MG MM DIGITAL SCREENING BILAT W/ TOMO AND CAD
8 series · 8 of 24 positions shown · non-contrast
Comparison: Previous exam(s).

CLINICAL DATA: Screening.

EXAM:
DIGITAL SCREENING BILATERAL MAMMOGRAM WITH TOMOSYNTHESIS AND CAD
TECHNIQUE: Bilateral screening digital craniocaudal and mediolateral oblique
mammograms were obtained. Bilateral screening digital breast
tomosynthesis was performed. The images were evaluated with
computer-aided detection.

[L MLO synth-2D]
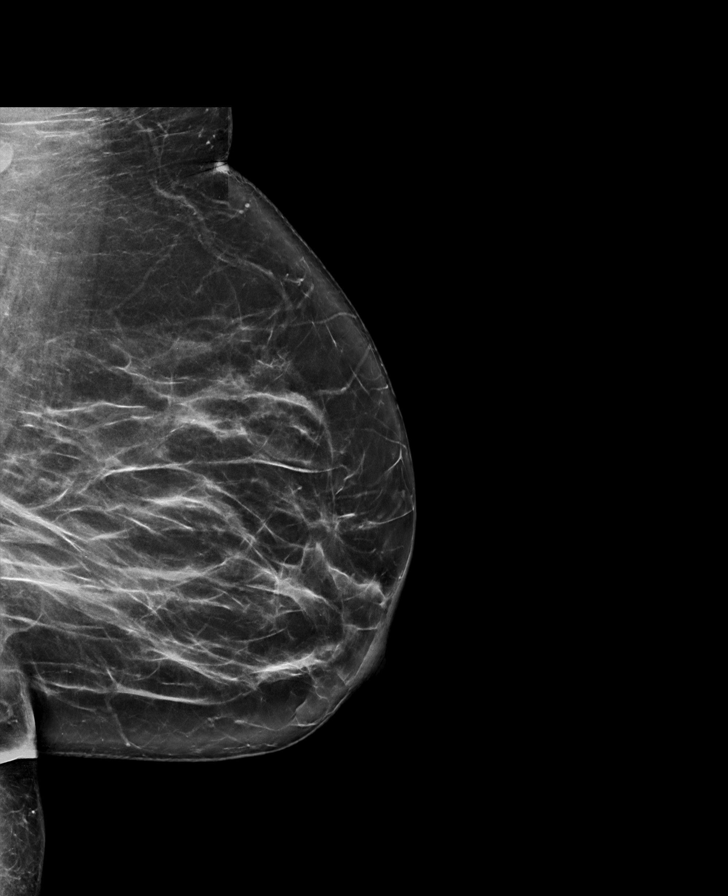

[R MLO synth-2D]
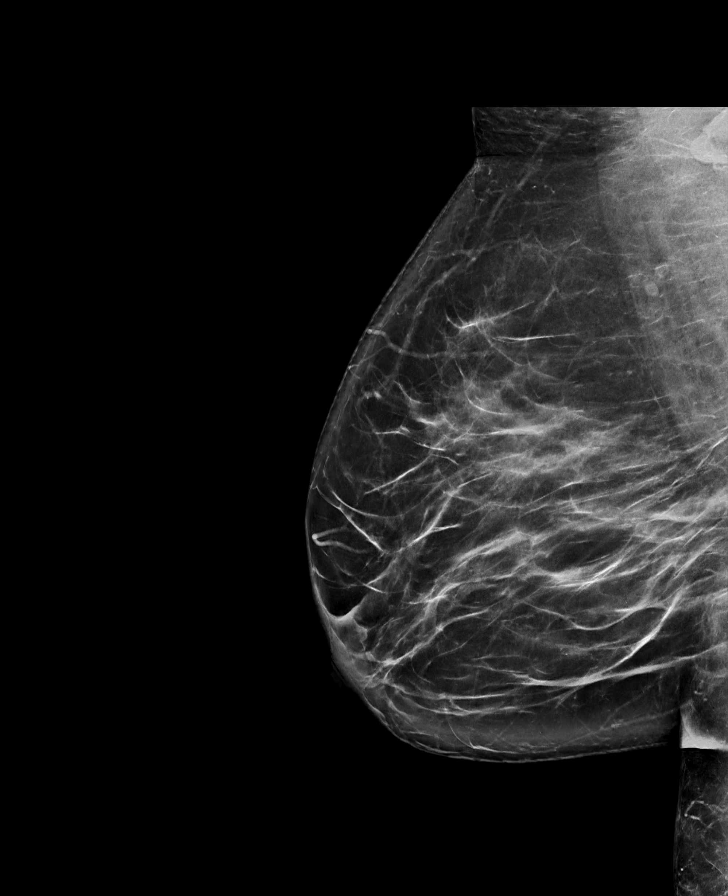

[R CC synth-2D]
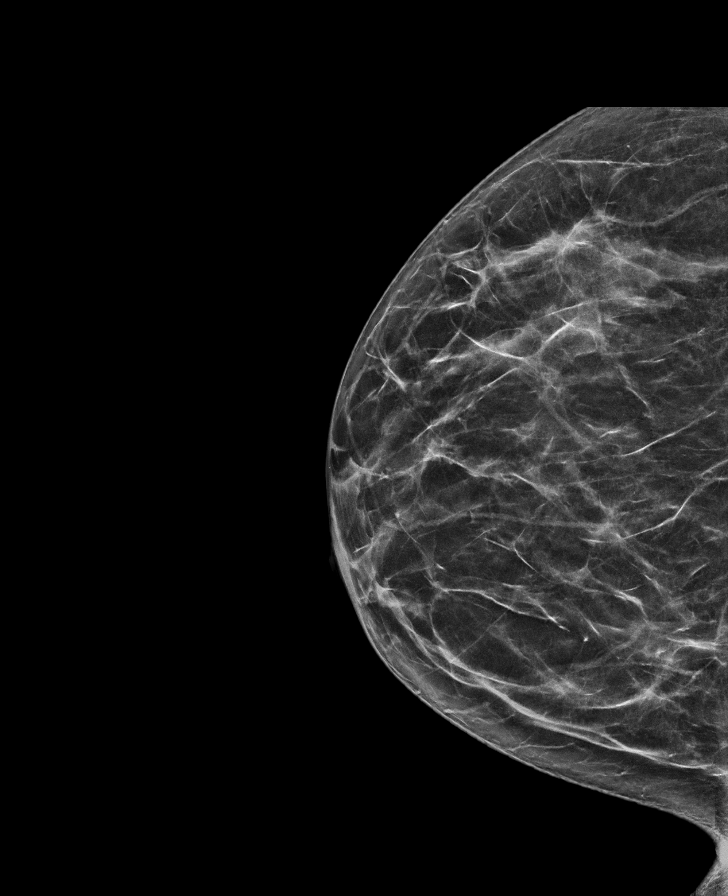

[L CC synth-2D]
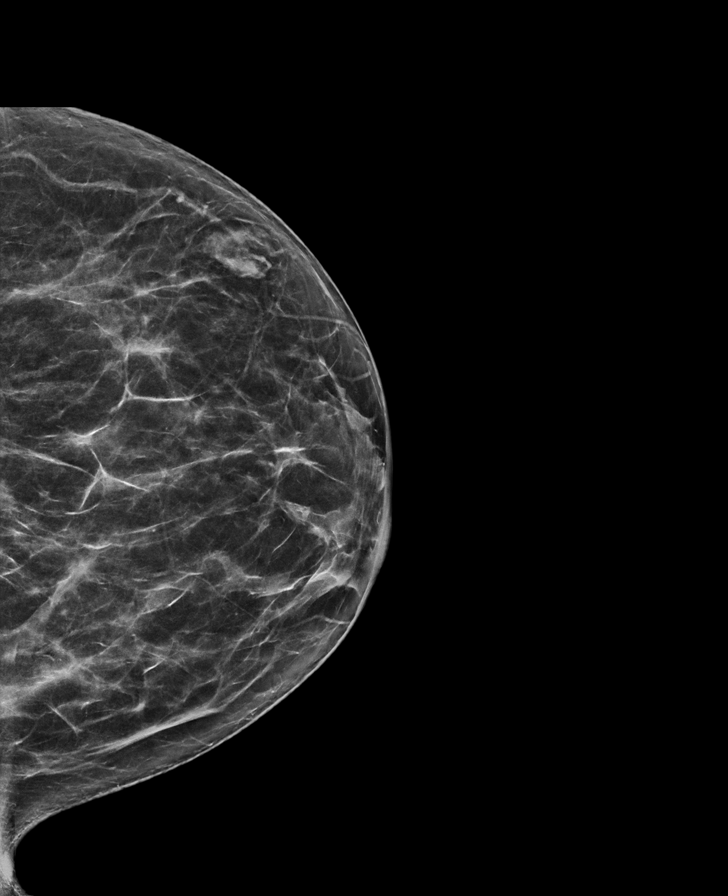

[L MLO tomo · tomo slice 41/82.0]
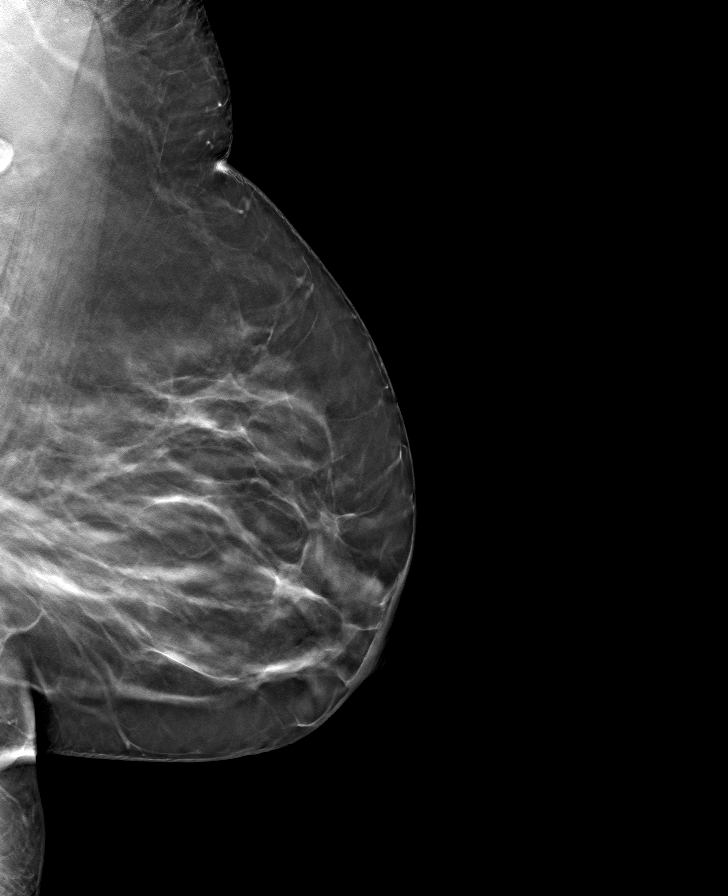

[R MLO tomo · tomo slice 45/88.0]
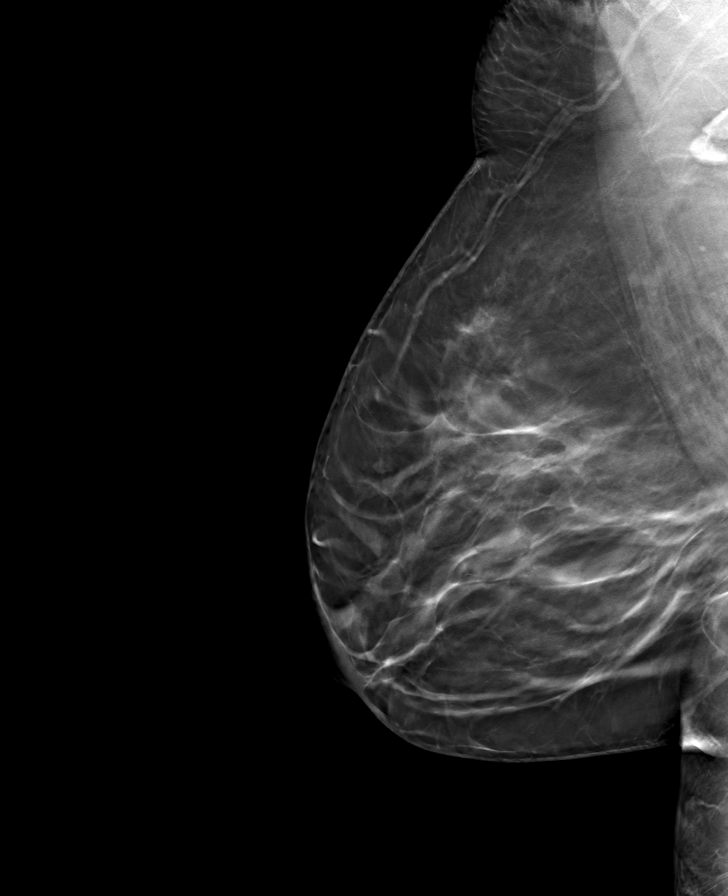

[R CC tomo · tomo slice 33/65.0]
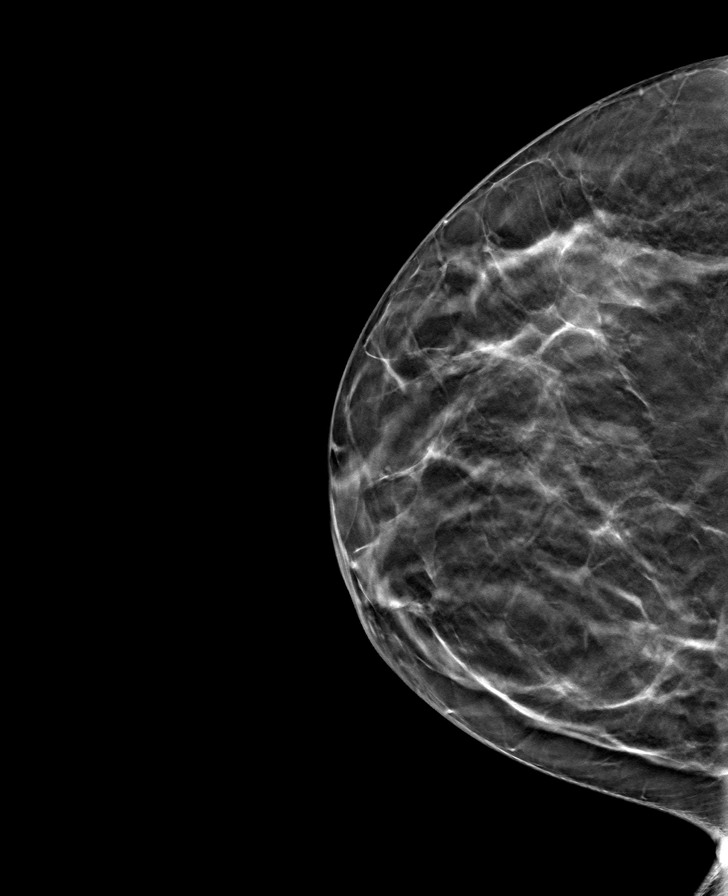

[L CC tomo · tomo slice 34/67.0]
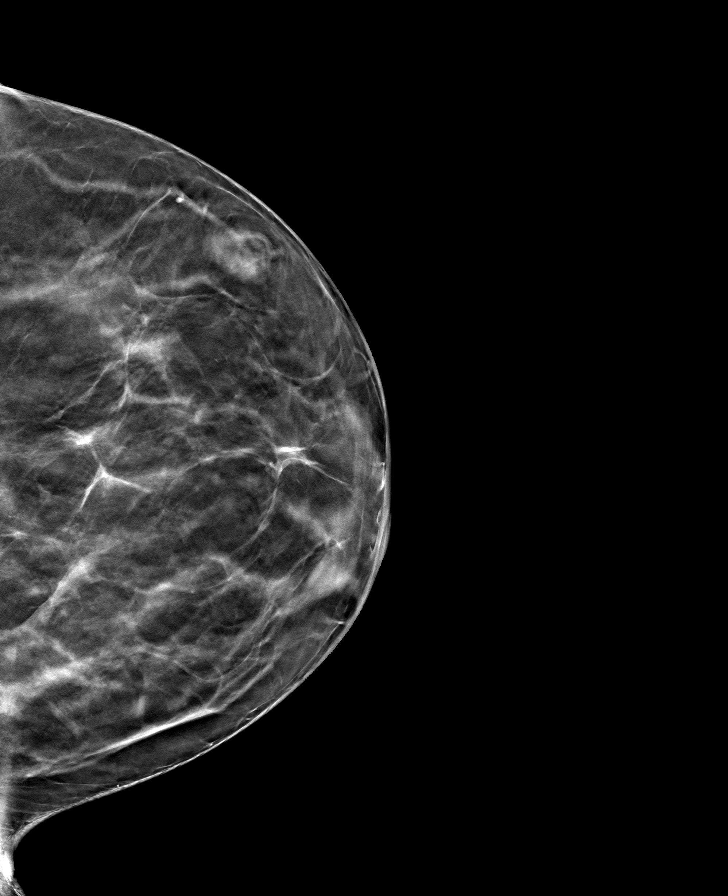

[8 of 24 positions shown; findings below may reference images not displayed]

ACR Breast Density Category b: There are scattered areas of
fibroglandular density.
FINDINGS: There are no findings suspicious for malignancy.
IMPRESSION: No mammographic evidence of malignancy. A result letter of this
screening mammogram will be mailed directly to the patient.

RECOMMENDATION:
Screening mammogram in one year. (Code:51-O-LD2)

BI-RADS CATEGORY  1: Negative.

## 2023-06-17 ENCOUNTER — Other Ambulatory Visit (HOSPITAL_COMMUNITY): Payer: Self-pay

## 2023-07-16 ENCOUNTER — Other Ambulatory Visit (HOSPITAL_COMMUNITY): Payer: Self-pay

## 2023-07-16 ENCOUNTER — Other Ambulatory Visit: Payer: Self-pay

## 2023-09-02 ENCOUNTER — Other Ambulatory Visit (HOSPITAL_COMMUNITY): Payer: Self-pay

## 2023-09-02 MED ORDER — NITROFURANTOIN MONOHYD MACRO 100 MG PO CAPS
100.0000 mg | ORAL_CAPSULE | Freq: Two times a day (BID) | ORAL | 0 refills | Status: DC
  Filled 2023-09-02: qty 14, 7d supply, fill #0

## 2023-09-02 MED ORDER — NITROFURANTOIN MONOHYD MACRO 100 MG PO CAPS
100.0000 mg | ORAL_CAPSULE | Freq: Two times a day (BID) | ORAL | 1 refills | Status: DC
  Filled 2023-09-02: qty 14, 7d supply, fill #0

## 2023-09-02 MED ORDER — PHENAZOPYRIDINE HCL 200 MG PO TABS
200.0000 mg | ORAL_TABLET | Freq: Two times a day (BID) | ORAL | 0 refills | Status: DC
  Filled 2023-09-02: qty 10, 5d supply, fill #0

## 2023-09-30 ENCOUNTER — Other Ambulatory Visit: Payer: Self-pay

## 2023-09-30 ENCOUNTER — Telehealth (HOSPITAL_COMMUNITY): Payer: Self-pay

## 2023-09-30 ENCOUNTER — Encounter: Payer: Self-pay | Admitting: Family Medicine

## 2023-09-30 ENCOUNTER — Other Ambulatory Visit (HOSPITAL_COMMUNITY): Payer: Self-pay

## 2023-09-30 ENCOUNTER — Ambulatory Visit: Admitting: Family Medicine

## 2023-09-30 VITALS — BP 152/104 | HR 88 | Temp 97.2°F | Ht 62.0 in | Wt 261.0 lb

## 2023-09-30 DIAGNOSIS — Z7985 Long-term (current) use of injectable non-insulin antidiabetic drugs: Secondary | ICD-10-CM

## 2023-09-30 DIAGNOSIS — E1159 Type 2 diabetes mellitus with other circulatory complications: Secondary | ICD-10-CM

## 2023-09-30 DIAGNOSIS — E118 Type 2 diabetes mellitus with unspecified complications: Secondary | ICD-10-CM

## 2023-09-30 DIAGNOSIS — Z6841 Body Mass Index (BMI) 40.0 and over, adult: Secondary | ICD-10-CM | POA: Diagnosis not present

## 2023-09-30 DIAGNOSIS — I152 Hypertension secondary to endocrine disorders: Secondary | ICD-10-CM

## 2023-09-30 LAB — POCT GLYCOSYLATED HEMOGLOBIN (HGB A1C): Hemoglobin A1C: 6.3 % — AB (ref 4.0–5.6)

## 2023-09-30 LAB — MICROALBUMIN / CREATININE URINE RATIO
Creatinine,U: 203.9 mg/dL
Microalb Creat Ratio: 8.4 mg/g (ref 0.0–30.0)
Microalb, Ur: 1.7 mg/dL (ref 0.0–1.9)

## 2023-09-30 MED ORDER — WEGOVY 2.4 MG/0.75ML ~~LOC~~ SOAJ
2.4000 mg | SUBCUTANEOUS | 5 refills | Status: DC
  Filled 2023-09-30 (×3): qty 3, 28d supply, fill #0

## 2023-09-30 MED ORDER — AMLODIPINE BESYLATE 5 MG PO TABS
5.0000 mg | ORAL_TABLET | Freq: Every day | ORAL | 3 refills | Status: AC
  Filled 2023-09-30 (×2): qty 90, 90d supply, fill #0
  Filled 2024-01-20: qty 90, 90d supply, fill #1

## 2023-09-30 NOTE — Assessment & Plan Note (Signed)
 A1c well-controlled 6.3 on Ozempic  2 mg weekly though she is still having difficulty with weight gain.  We did discuss changing management options to also further assist her with weight loss.  Will try Wegovy  2.4 mg weekly.  If insurance will not pay for this we will try going back to Mounjaro  15 mg weekly with potentially adding on adjunct medication such as Jardiance or metformin .

## 2023-09-30 NOTE — Assessment & Plan Note (Signed)
 Up about 16 pounds since her last visit.  We are switching her Ozempic  to Wegovy .  Also referring to medical weight management.  May consider switching to Mounjaro  at some point in future as above.

## 2023-09-30 NOTE — Assessment & Plan Note (Signed)
 Blood pressure elevated today.  Not been taking amlodipine  routinely at home.  Will restart today.  She will monitor at home and let us  know if persistently elevated.

## 2023-09-30 NOTE — Progress Notes (Signed)
   Christine Best is a 48 y.o. female who presents today for an office visit.  Assessment/Plan:  Chronic Problems Addressed Today: Type 2 diabetes mellitus with complication (HCC) A1c well-controlled 6.3 on Ozempic  2 mg weekly though she is still having difficulty with weight gain.  We did discuss changing management options to also further assist her with weight loss.  Will try Wegovy  2.4 mg weekly.  If insurance will not pay for this we will try going back to Mounjaro  15 mg weekly with potentially adding on adjunct medication such as Jardiance or metformin .   Hypertension associated with diabetes (HCC) Blood pressure elevated today.  Not been taking amlodipine  routinely at home.  Will restart today.  She will monitor at home and let us  know if persistently elevated.  Morbid obesity (HCC) Up about 16 pounds since her last visit.  We are switching her Ozempic  to Wegovy .  Also referring to medical weight management.  May consider switching to Mounjaro  at some point in future as above.     Subjective:  HPI:  See assessment / plan for status of chronic conditions.   Discussed the use of AI scribe software for clinical note transcription with the patient, who gave verbal consent to proceed.  History of Present Illness Christine Best is a 48 year old female with diabetes who presents for follow-up on weight management.  She is following up on her diabetes management, with her A1c recently recorded at 6.3. However, she is concerned about her weight loss journey, expressing uncertainty about what else can be done.  She has previously tried Mounjaro  and Ozempic  for weight management. She felt that Mounjaro  was not as effective as Ozempic , having used it for about three months before switching back. She is open to trying Wegovy , which is a higher dose version of Ozempic , but is concerned about insurance coverage for this medication.  She has made lifestyle changes, such as no longer drinking  soda, which she describes as a 'hard taper'. Despite these efforts, she is still struggling with weight loss.  She has a history of using phentermine in the past, which she found effective initially. She is considering this option again as part of her weight management strategy.  She is not currently taking amlodipine  and has not been monitoring her blood pressure at home.         Objective:  Physical Exam: BP (!) 144/97   Pulse 88   Temp (!) 97.2 F (36.2 C) (Temporal)   Ht 5' 2 (1.575 m)   Wt 261 lb (118.4 kg)   SpO2 99%   BMI 47.74 kg/m   Wt Readings from Last 3 Encounters:  09/30/23 261 lb (118.4 kg)  01/24/23 245 lb 11.2 oz (111.4 kg)  01/03/23 240 lb (108.9 kg)    Gen: No acute distress, resting comfortably CV: Regular rate and rhythm with no murmurs appreciated Pulm: Normal work of breathing, clear to auscultation bilaterally with no crackles, wheezes, or rhonchi Neuro: Grossly normal, moves all extremities Psych: Normal affect and thought content      Alexyia Guarino M. Kennyth, MD 09/30/2023 7:50 AM

## 2023-09-30 NOTE — Patient Instructions (Addendum)
 It was very nice to see you today!  VISIT SUMMARY: Today, we discussed your weight management and diabetes control. Your A1c is well-controlled at 6.3, but you are facing challenges with weight loss. We reviewed your medication options and lifestyle changes, and we will explore further steps to support your weight loss journey.  YOUR PLAN: OBESITY: You are struggling with weight loss despite making lifestyle changes and trying different medications. -We will refer you to a weight management clinic for specialist consultation. -We will attempt to switch you to Wegovy  at 2.4 mg if your insurance allows. -If Wegovy  is not covered, we may switch you back to Mounjaro . -Consider adding Jardiance or metformin  if additional weight loss support is needed. -Discuss phentermine as a cost-effective option if insurance does not cover other medications.  TYPE 2 DIABETES MELLITUS WITHOUT COMPLICATIONS: Your diabetes is well-controlled with an A1c of 6.3. -Continue your current diabetes management plan.  ESSENTIAL HYPERTENSION: Your blood pressure is slightly elevated, and you are not currently monitoring it at home or taking amlodipine . -Reinitiate your amlodipine  prescription. -Start monitoring your blood pressure at home. -Follow up in a couple of weeks via MyChart to assess your blood pressure control.  Return for Annual Physical.   Take care, Dr Kennyth  PLEASE NOTE:  If you had any lab tests, please let us  know if you have not heard back within a few days. You may see your results on mychart before we have a chance to review them but we will give you a call once they are reviewed by us .   If we ordered any referrals today, please let us  know if you have not heard from their office within the next week.   If you had any urgent prescriptions sent in today, please check with the pharmacy within an hour of our visit to make sure the prescription was transmitted appropriately.   Please try these  tips to maintain a healthy lifestyle:  Eat at least 3 REAL meals and 1-2 snacks per day.  Aim for no more than 5 hours between eating.  If you eat breakfast, please do so within one hour of getting up.   Each meal should contain half fruits/vegetables, one quarter protein, and one quarter carbs (no bigger than a computer mouse)  Cut down on sweet beverages. This includes juice, soda, and sweet tea.   Drink at least 1 glass of water  with each meal and aim for at least 8 glasses per day  Exercise at least 150 minutes every week.

## 2023-10-01 ENCOUNTER — Ambulatory Visit: Payer: Self-pay | Admitting: Family Medicine

## 2023-10-01 ENCOUNTER — Other Ambulatory Visit (HOSPITAL_COMMUNITY): Payer: Self-pay

## 2023-10-01 NOTE — Telephone Encounter (Signed)
 Patient Diabetic  Please send a PA Code E11.8

## 2023-10-01 NOTE — Progress Notes (Signed)
 Urine albumin creatinine ratio is normal.  We can recheck in a year.

## 2023-10-01 NOTE — Telephone Encounter (Signed)
 Looks like her insurance will not pay for Wegovy  for the diabetes diagnosis.  We can resubmit under morbid obesity to see if they will pay for this.  If not then we can try Mounjaro  15 mg weekly.

## 2023-10-02 ENCOUNTER — Other Ambulatory Visit: Payer: Self-pay | Admitting: *Deleted

## 2023-10-02 ENCOUNTER — Other Ambulatory Visit (HOSPITAL_COMMUNITY): Payer: Self-pay

## 2023-10-02 MED ORDER — MOUNJARO 15 MG/0.5ML ~~LOC~~ SOAJ
15.0000 mg | SUBCUTANEOUS | 0 refills | Status: DC
  Filled 2023-10-02 – 2023-10-24 (×3): qty 2, 28d supply, fill #0

## 2023-10-03 ENCOUNTER — Other Ambulatory Visit: Payer: Self-pay | Admitting: Family Medicine

## 2023-10-03 DIAGNOSIS — Z1231 Encounter for screening mammogram for malignant neoplasm of breast: Secondary | ICD-10-CM

## 2023-10-09 ENCOUNTER — Other Ambulatory Visit (HOSPITAL_COMMUNITY): Payer: Self-pay

## 2023-10-09 ENCOUNTER — Ambulatory Visit
Admission: EM | Admit: 2023-10-09 | Discharge: 2023-10-09 | Disposition: A | Discharge status: Home / Self Care | Attending: Family Medicine | Admitting: Family Medicine

## 2023-10-09 DIAGNOSIS — J029 Acute pharyngitis, unspecified: Secondary | ICD-10-CM | POA: Diagnosis not present

## 2023-10-09 DIAGNOSIS — D259 Leiomyoma of uterus, unspecified: Secondary | ICD-10-CM | POA: Insufficient documentation

## 2023-10-09 DIAGNOSIS — E669 Obesity, unspecified: Secondary | ICD-10-CM | POA: Insufficient documentation

## 2023-10-09 DIAGNOSIS — Z9889 Other specified postprocedural states: Secondary | ICD-10-CM | POA: Insufficient documentation

## 2023-10-09 DIAGNOSIS — E119 Type 2 diabetes mellitus without complications: Secondary | ICD-10-CM | POA: Insufficient documentation

## 2023-10-09 DIAGNOSIS — Z9641 Presence of insulin pump (external) (internal): Secondary | ICD-10-CM | POA: Insufficient documentation

## 2023-10-09 DIAGNOSIS — O4400 Placenta previa specified as without hemorrhage, unspecified trimester: Secondary | ICD-10-CM | POA: Insufficient documentation

## 2023-10-09 DIAGNOSIS — I10 Essential (primary) hypertension: Secondary | ICD-10-CM | POA: Insufficient documentation

## 2023-10-09 DIAGNOSIS — O09529 Supervision of elderly multigravida, unspecified trimester: Secondary | ICD-10-CM | POA: Insufficient documentation

## 2023-10-09 HISTORY — DX: Allergy, unspecified, initial encounter: T78.40XA

## 2023-10-09 MED ORDER — FLUCONAZOLE 150 MG PO TABS
150.0000 mg | ORAL_TABLET | ORAL | 0 refills | Status: DC
  Filled 2023-10-09: qty 3, 9d supply, fill #0

## 2023-10-09 MED ORDER — AMOXICILLIN 875 MG PO TABS
875.0000 mg | ORAL_TABLET | Freq: Two times a day (BID) | ORAL | 0 refills | Status: DC
  Filled 2023-10-09: qty 20, 10d supply, fill #0

## 2023-10-09 NOTE — ED Provider Notes (Signed)
 [facility]  Note:  This document was prepared using Conservation officer, historic buildings and may include unintentional dictation errors.  MRN: [id number] DOB: 02/11/1976  Subjective:   Christine Best is a 48 y.o. female presenting for 5-day history of persistent throat pain, painful swallowing, chills, hot and cold sensations.  Has felt fatigued.  Has used over-the-counter Aleve  with out much relief.  She was exposed to strep throat from her daughter was not treated.    No current facility-administered medications for this encounter.  Current Outpatient Medications:    amLODipine  (NORVASC ) 5 MG tablet, Take 1 tablet (5 mg total) by mouth daily., Disp: 90 tablet, Rfl: 3   Continuous Blood Gluc Receiver (DEXCOM G6 RECEIVER) DEVI, Use daily as needed to check blood sugar., Disp: 1 each, Rfl: 5   Continuous Blood Gluc Sensor (DEXCOM G6 SENSOR) MISC, Use daily as needed to check blood sugar., Disp: 3 each, Rfl: 5   tirzepatide  (MOUNJARO ) 15 MG/0.5ML Pen, Inject 15 mg into the skin once a week., Disp: 2 mL, Rfl: 0   Allergies  Allergen Reactions   Lisinopril  Anaphylaxis, Swelling and Other (See Comments)    angioedema    Past Medical History:  Diagnosis Date   Anemia    Chronic constipation    Delivery with history of C-section 08/03/2019   History of MRSA infection    HTN (hypertension)    followed by pcp   Infertility, female    Menorrhagia    Shortness of breath on exertion 09/03/2017   Type 2 diabetes mellitus (HCC)    followed by pcp;   fasting sugar-- 130   (does not check blood sugar on regular basis)   Uterine fibroid    Wears contact lenses      Past Surgical History:  Procedure Laterality Date   ADENOIDECTOMY  age 30   CESAREAN SECTION N/A 08/03/2019   Procedure: CESAREAN SECTION;  Surgeon: Delana Ted Morrison, DO;  Location: [facility];  Service: Obstetrics;  Laterality: N/A;  Tracey RNFA   COLONOSCOPY  01/03/2023   normal   CYSTOSCOPY N/A  01/24/2023   Procedure: CYSTOSCOPY;  Surgeon: Delana Ted Morrison, DO;  Location: [facility];  Service: Gynecology;  Laterality: N/A;   HYSTEROSCOPY N/A 11/07/2018   Procedure: HYSTEROSCOPY, endometrial biopsy;  Surgeon: Yalcinkaya, Tamer, MD;  Location: [facility];  Service: Gynecology;  Laterality: N/A;   ROBOT ASSISTED MYOMECTOMY N/A 03/26/2018   Procedure: XI ROBOTIC ASSISTED LAPAROSCOPIC MYOMECTOMY WITH TRANSVAGINAL ULTRASOUND GUIDANCE AND EXCISION OF ENDOMETRIOSIS;  Surgeon: Yalcinkaya, Tamer, MD;  Location: [facility];  Service: Gynecology;  Laterality: N/A;   ROBOT ASSISTED MYOMECTOMY  01-24-2010   dr rogelio @[facility]    and [facility] HYSTEROSCOPY   TOTAL LAPAROSCOPIC HYSTERECTOMY WITH SALPINGECTOMY Bilateral 01/24/2023   Procedure: TOTAL LAPAROSCOPIC Supracervacal HYSTERECTOMY WITH SALPINGECTOMY, Mini lapaarotomy,lysis of adhesions;  Surgeon: Delana Ted Morrison, DO;  Location: [facility];  Service: Gynecology;  Laterality: Bilateral;   UMBILICAL HERNIA REPAIR N/A 08/15/2022   Procedure: HERNIA REPAIR UMBILICAL ADULT;  Surgeon: Signe Mitzie LABOR, MD;  Location: [facility];  Service: General;  Laterality: N/A;    Family History  Problem Relation Age of Onset   Colon polyps Mother    Diabetes Mother    Hypertension Mother    Cancer Father    Diabetes Brother    Cancer Maternal Aunt        type unknown   Colon cancer Neg Hx  Esophageal cancer Neg Hx    Stomach cancer Neg Hx    Rectal cancer Neg Hx     Social History   Tobacco Use   Smoking status: Never   Smokeless tobacco: Never  Vaping Use   Vaping status: Never Used  Substance Use Topics   Alcohol use: Yes    Comment: rarely   Drug use: No    ROS   Objective:   Vitals: BP 134/85 (BP Location: Left Arm)   Pulse (!) 104   Temp 99.3 F (37.4 C) (Oral)   Resp 18   Ht 5' (1.524 m)   Wt 260 lb (117.9 kg)   LMP  (LMP Unknown) Comment: 2023, Last Menses.   SpO2 97%   BMI 50.78 kg/m   Physical Exam Constitutional:      General: She is not in acute distress.    Appearance: Normal appearance. She is well-developed. She is not ill-appearing, toxic-appearing or diaphoretic.  HENT:     Head: Normocephalic and atraumatic.     Nose: Nose normal.     Mouth/Throat:     Mouth: Mucous membranes are moist.     Pharynx: Pharyngeal swelling, oropharyngeal exudate and posterior oropharyngeal erythema present. No uvula swelling.     Tonsils: Tonsillar exudate present. No tonsillar abscesses. 1+ on the right. 1+ on the left.  Eyes:     General: No scleral icterus.       Right eye: No discharge.        Left eye: No discharge.     Extraocular Movements: Extraocular movements intact.  Cardiovascular:     Rate and Rhythm: Normal rate.  Pulmonary:     Effort: Pulmonary effort is normal.  Skin:    General: Skin is warm and dry.  Neurological:     General: No focal deficit present.     Mental Status: She is alert and oriented to person, place, and time.  Psychiatric:        Mood and Affect: Mood normal.        Behavior: Behavior normal.     Assessment and Plan :   PDMP not reviewed this encounter.  1. Acute pharyngitis, unspecified etiology    Will treat empirically for pharyngitis given physical exam findings.  Patient is to start amoxicillin , use supportive care otherwise.  Use fluconazole  for antibiotic associated yeast infection.  Counseled patient on potential for adverse effects with medications prescribed/recommended today, ER and return-to-clinic precautions discussed, patient verbalized understanding.    Christopher Savannah, [place] 10/09/23 1043

## 2023-10-09 NOTE — ED Triage Notes (Signed)
 This started Saturday with sore throat, then chills, hot/cold, using OTC Aleve  that isn't helping, now tired, restless, fatigue. My daughter about 2 wks ago + was diagnosed with Strep. No unexplained rash (current rash from spider bite, unrelated). No fever (99).

## 2023-10-10 ENCOUNTER — Encounter (INDEPENDENT_AMBULATORY_CARE_PROVIDER_SITE_OTHER): Payer: Self-pay

## 2023-10-24 ENCOUNTER — Other Ambulatory Visit: Payer: Self-pay

## 2023-10-24 ENCOUNTER — Other Ambulatory Visit (HOSPITAL_COMMUNITY): Payer: Self-pay

## 2023-11-11 ENCOUNTER — Ambulatory Visit
Admission: RE | Admit: 2023-11-11 | Discharge: 2023-11-11 | Disposition: A | Discharge status: Home / Self Care | Source: Ambulatory Visit | Attending: Family Medicine | Admitting: Family Medicine

## 2023-11-11 DIAGNOSIS — Z1231 Encounter for screening mammogram for malignant neoplasm of breast: Secondary | ICD-10-CM | POA: Diagnosis not present

## 2023-11-20 ENCOUNTER — Other Ambulatory Visit (HOSPITAL_COMMUNITY): Payer: Self-pay

## 2023-11-20 ENCOUNTER — Other Ambulatory Visit: Payer: Self-pay | Admitting: Family Medicine

## 2023-11-21 ENCOUNTER — Other Ambulatory Visit: Payer: Self-pay

## 2023-11-21 ENCOUNTER — Other Ambulatory Visit (HOSPITAL_COMMUNITY): Payer: Self-pay

## 2023-11-21 MED ORDER — MOUNJARO 15 MG/0.5ML ~~LOC~~ SOAJ
15.0000 mg | SUBCUTANEOUS | 0 refills | Status: DC
  Filled 2023-11-21: qty 2, 28d supply, fill #0

## 2023-12-06 ENCOUNTER — Ambulatory Visit: Admitting: Family Medicine

## 2023-12-06 ENCOUNTER — Other Ambulatory Visit (HOSPITAL_COMMUNITY): Payer: Self-pay

## 2023-12-06 ENCOUNTER — Encounter: Payer: Self-pay | Admitting: Family Medicine

## 2023-12-06 VITALS — BP 128/88 | HR 98 | Temp 97.2°F | Ht 60.0 in | Wt 259.0 lb

## 2023-12-06 DIAGNOSIS — Z0001 Encounter for general adult medical examination with abnormal findings: Secondary | ICD-10-CM | POA: Diagnosis not present

## 2023-12-06 DIAGNOSIS — E118 Type 2 diabetes mellitus with unspecified complications: Secondary | ICD-10-CM

## 2023-12-06 DIAGNOSIS — E1159 Type 2 diabetes mellitus with other circulatory complications: Secondary | ICD-10-CM

## 2023-12-06 DIAGNOSIS — Z13220 Encounter for screening for lipoid disorders: Secondary | ICD-10-CM

## 2023-12-06 DIAGNOSIS — Z6841 Body Mass Index (BMI) 40.0 and over, adult: Secondary | ICD-10-CM | POA: Diagnosis not present

## 2023-12-06 DIAGNOSIS — I152 Hypertension secondary to endocrine disorders: Secondary | ICD-10-CM

## 2023-12-06 DIAGNOSIS — E611 Iron deficiency: Secondary | ICD-10-CM | POA: Diagnosis not present

## 2023-12-06 MED ORDER — PHENTERMINE HCL 15 MG PO CAPS
15.0000 mg | ORAL_CAPSULE | Freq: Two times a day (BID) | ORAL | 3 refills | Status: AC
  Filled 2023-12-06: qty 60, 30d supply, fill #0
  Filled 2024-01-06: qty 60, 30d supply, fill #1
  Filled 2024-02-03 – 2024-02-12 (×3): qty 60, 30d supply, fill #2
  Filled 2024-03-10 – 2024-03-11 (×2): qty 60, 30d supply, fill #3

## 2023-12-06 NOTE — Progress Notes (Signed)
 Chief Complaint:  Christine Best is a 48 y.o. female who presents today for her annual comprehensive physical exam.    Assessment/Plan:  Chronic Problems Addressed Today: Type 2 diabetes mellitus with complication (HCC) Check A1c with labs.  Last A1c 6.3.  She is now on Mounjaro  15 mg weekly and tolerating well though is not had as much weight loss that she would like.  We discussed lifestyle interventions.  Morbid obesity (HCC) Patient down 1 pound since last time she was here.  BMI 50.58 with comorbidities.  She would like to discuss additional options to help her with weight loss.  She is on Mounjaro  15 mg weekly for her diabetes and tolerating this well.  We discussed differential options.  She has been on phentermine in the past and has done well with this.  Will start 50 mg daily with the option to increase to 30 mg daily after couple weeks if she does well with this.  She is aware potential side effects.  She will follow-up with us  in a few weeks via MyChart.  Follow-up office visit in 3 months.  Hypertension associated with diabetes (HCC) At goal today on amlodipine  5 mg daily.  She will monitor this as we are starting her phentermine as above.  Iron  deficiency Check iron  panel with labs.  Preventative Healthcare: Check labs.  Up-to-date on vaccines and screenings.   Patient Counseling(The following topics were reviewed and/or handout was given):  -Nutrition: Stressed importance of moderation in sodium/caffeine intake, saturated fat and cholesterol, caloric balance, sufficient intake of fresh fruits, vegetables, and fiber.  -Stressed the importance of regular exercise.   -Substance Abuse: Discussed cessation/primary prevention of tobacco, alcohol, or other drug use; driving or other dangerous activities under the influence; availability of treatment for abuse.   -Injury prevention: Discussed safety belts, safety helmets, smoke detector, smoking near bedding or upholstery.    -Sexuality: Discussed sexually transmitted diseases, partner selection, use of condoms, avoidance of unintended pregnancy and contraceptive alternatives.   -Dental health: Discussed importance of regular tooth brushing, flossing, and dental visits.  -Health maintenance and immunizations reviewed. Please refer to Health maintenance section.  Return to care in 1 year for next preventative visit.     Subjective:  HPI:  She has no acute complaints today. Patient is here today for her annual physical.  See assessment / plan for status of chronic conditions.  Discussed the use of AI scribe software for clinical note transcription with the patient, who gave verbal consent to proceed.  History of Present Illness ALEGANDRA Best is a 48 year old female who presents for an annual physical exam.  She is currently taking Mounjaro , which helps suppress her appetite, but she finds it challenging to maintain her diet and exercise routine. Fatigue by mid-afternoon affects her ability to engage in physical activity.  She has previously used phentermine for weight management, which she found helpful, although she does not recall the specific dose.      12/06/2023    7:28 AM  Depression screen PHQ 2/9  Decreased Interest 0  Down, Depressed, Hopeless 0  PHQ - 2 Score 0    Health Maintenance Due  Topic Date Due   FOOT EXAM  07/06/2022   OPHTHALMOLOGY EXAM  03/17/2023     ROS: Per HPI, otherwise a complete review of systems was negative.   PMH:  The following were reviewed and entered/updated in epic: Past Medical History:  Diagnosis Date   Allergy 2019  Anemia    Chronic constipation    Delivery with history of C-section 08/03/2019   History of MRSA infection    HTN (hypertension)    followed by pcp   Infertility, female    Menorrhagia    Shortness of breath on exertion 09/03/2017   Type 2 diabetes mellitus (HCC)    followed by pcp;   fasting sugar-- 130   (does not check blood  sugar on regular basis)   Uterine fibroid    Wears contact lenses    Patient Active Problem List   Diagnosis Date Noted   Placenta previa 10/09/2023   Presence of insulin  pump 10/09/2023   Uterine leiomyoma 10/09/2023   Essential hypertension 10/09/2023   Hypertensive disorder 10/09/2023   Morbid (severe) obesity due to excess calories (HCC) 10/09/2023   Obesity 10/09/2023   Maternal age 3+, multigravida, antepartum 10/09/2023   History of myomectomy 10/09/2023   Type 2 diabetes mellitus (HCC) 10/09/2023   Status post hysterectomy 01/24/2023   Status post abdominal hysterectomy 01/24/2023   Iron  deficiency 05/22/2022   Dysmenorrhea 03/02/2022   Umbilical hernia 03/02/2022   Obesity complicating pregnancy, second trimester 04/27/2019   Pregnancy 01/30/2019   Type 2 diabetes mellitus with complication (HCC) 04/03/2017   Morbid obesity (HCC) 04/03/2017   Hypertension associated with diabetes (HCC) 04/03/2017   Past Surgical History:  Procedure Laterality Date   ADENOIDECTOMY  age 85   CESAREAN SECTION N/A 08/03/2019   Procedure: CESAREAN SECTION;  Surgeon: Delana Ted Morrison, DO;  Location: [facility];  Service: Obstetrics;  Laterality: N/A;  Tracey RNFA   COLONOSCOPY  01/03/2023   normal   CYSTOSCOPY N/A 01/24/2023   Procedure: CYSTOSCOPY;  Surgeon: Delana Ted Morrison, DO;  Location: [facility];  Service: Gynecology;  Laterality: N/A;   HERNIA REPAIR  08/2022   HYSTEROSCOPY N/A 11/07/2018   Procedure: HYSTEROSCOPY, endometrial biopsy;  Surgeon: Yalcinkaya, Tamer, MD;  Location: [facility];  Service: Gynecology;  Laterality: N/A;   ROBOT ASSISTED MYOMECTOMY N/A 03/26/2018   Procedure: XI ROBOTIC ASSISTED LAPAROSCOPIC MYOMECTOMY WITH TRANSVAGINAL ULTRASOUND GUIDANCE AND EXCISION OF ENDOMETRIOSIS;  Surgeon: Yalcinkaya, Tamer, MD;  Location: [facility];  Service: Gynecology;  Laterality: N/A;   ROBOT ASSISTED MYOMECTOMY   01-24-2010   dr rogelio @[facility]    and [facility] HYSTEROSCOPY   TOTAL LAPAROSCOPIC HYSTERECTOMY WITH SALPINGECTOMY Bilateral 01/24/2023   Procedure: TOTAL LAPAROSCOPIC Supracervacal HYSTERECTOMY WITH SALPINGECTOMY, Mini lapaarotomy,lysis of adhesions;  Surgeon: Delana Ted Morrison, DO;  Location: [facility];  Service: Gynecology;  Laterality: Bilateral;   UMBILICAL HERNIA REPAIR N/A 08/15/2022   Procedure: HERNIA REPAIR UMBILICAL ADULT;  Surgeon: Signe Mitzie LABOR, MD;  Location: [facility];  Service: General;  Laterality: N/A;    Family History  Problem Relation Age of Onset   Colon polyps Mother    Diabetes Mother    Hypertension Mother    Cancer Father    Diabetes Brother    Cancer Maternal Aunt        type unknown   Colon cancer Neg Hx    Esophageal cancer Neg Hx    Stomach cancer Neg Hx    Rectal cancer Neg Hx     Medications- reviewed and updated Current Outpatient Medications  Medication Sig Dispense Refill   amLODipine  (NORVASC ) 5 MG tablet Take 1 tablet (5 mg total) by mouth daily. 90 tablet 3   Continuous Blood Gluc Receiver (DEXCOM G6 RECEIVER) DEVI Use daily as needed to check blood  sugar. 1 each 5   Continuous Blood Gluc Sensor (DEXCOM G6 SENSOR) MISC Use daily as needed to check blood sugar. 3 each 5   phentermine 15 MG capsule Take 1 capsule (15 mg total) by mouth 2 (two) times daily. 60 capsule 3   tirzepatide  (MOUNJARO ) 15 MG/0.5ML Pen Inject 15 mg into the skin once a week. 2 mL 0   No current facility-administered medications for this visit.    Allergies-reviewed and updated Allergies  Allergen Reactions   Lisinopril  Anaphylaxis, Swelling and Other (See Comments)    angioedema    Social History   Socioeconomic History   Marital status: Single    Spouse name: Not on file   Number of children: 0   Years of education: Not on file   Highest education level: Master's degree (e.g., MA, MS, MEng, MEd, MSW, MBA)  Occupational History    Occupation: Freight forwarder  Tobacco Use   Smoking status: Never   Smokeless tobacco: Never  Vaping Use   Vaping status: Never Used  Substance and Sexual Activity   Alcohol use: Yes    Comment: occassionally.   Drug use: No   Sexual activity: Yes    Birth control/protection: None  Other Topics Concern   Not on file  Social History Narrative   Not on file   Social Drivers of Health   Financial Resource Strain: Low Risk  (12/05/2023)   Overall Financial Resource Strain (CARDIA)    Difficulty of Paying Living Expenses: Not very hard  Food Insecurity: No Food Insecurity (12/05/2023)   Hunger Vital Sign    Worried About Running Out of Food in the Last Year: Never true    Ran Out of Food in the Last Year: Never true  Transportation Needs: No Transportation Needs (12/05/2023)   PRAPARE - Administrator, Civil Service (Medical): No    Lack of Transportation (Non-Medical): No  Physical Activity: Inactive (12/05/2023)   Exercise Vital Sign    Days of Exercise per Week: 0 days    Minutes of Exercise per Session: Not on file  Stress: No Stress Concern Present (12/05/2023)   [employer] of Occupational Health - Occupational Stress Questionnaire    Feeling of Stress: Only a little  Social Connections: Moderately Integrated (12/05/2023)   Social Connection and Isolation Panel    Frequency of Communication with Friends and Family: More than three times a week    Frequency of Social Gatherings with Friends and Family: More than three times a week    Attends Religious Services: 1 to 4 times per year    Active Member of [employer] or Organizations: No    Attends Engineer, structural: Not on file    Marital Status: Living with partner        Objective:  Physical Exam: BP 128/88   Pulse 98   Temp (!) 97.2 F (36.2 C) (Temporal)   Wt 259 lb (117.5 kg)   LMP  (LMP Unknown) Comment: 2023, Last Menses.  SpO2 99%   BMI 50.58 kg/m   Body mass index is 50.58  kg/m. Wt Readings from Last 3 Encounters:  12/06/23 259 lb (117.5 kg)  10/09/23 260 lb (117.9 kg)  09/30/23 261 lb (118.4 kg)   Gen: NAD, resting comfortably HEENT: TMs normal bilaterally. OP clear. No thyromegaly noted.  CV: RRR with no murmurs appreciated Pulm: NWOB, CTAB with no crackles, wheezes, or rhonchi GI: Normal bowel sounds present. Soft, Nontender, Nondistended. MSK: no edema, cyanosis,  or clubbing noted Skin: warm, dry Neuro: CN2-12 grossly intact. Strength 5/5 in upper and lower extremities. Reflexes symmetric and intact bilaterally.  Psych: Normal affect and thought content     Joren Rehm M. Kennyth, MD 12/06/2023 7:49 AM

## 2023-12-06 NOTE — Assessment & Plan Note (Signed)
 At goal today on amlodipine  5 mg daily.  She will monitor this as we are starting her phentermine as above.

## 2023-12-06 NOTE — Assessment & Plan Note (Signed)
 Check iron panel with labs.

## 2023-12-06 NOTE — Patient Instructions (Addendum)
 It was very nice to see you today!  VISIT SUMMARY: Today, you had your annual physical exam where we discussed your current health status and preventive care measures.  YOUR PLAN: OBESITY: We discussed your obesity management and the challenges you face with diet and exercise. -Continue taking Mounjaro  as prescribed. -Start taking phentermine 15 mg once daily to aid weight loss. -Monitor for any side effects and report any issues. -Regularly check your blood pressure. -Send a follow-up message in 1-2 weeks to assess your response to the new medication.  ADULT WELLNESS VISIT: We reviewed your overall health and preventive care measures. -You are up to date on your flu vaccination and mammogram. -Your next colonoscopy is due in 2034. -A physical examination was performed, including heart and lung checks. -Blood work was ordered to check A1c, iron , B12, and vitamin D  levels. -Visit details have been documented in MyChart.  Return in about 3 months (around 03/07/2024) for Follow Up.   Take care, Dr Kennyth  PLEASE NOTE:  If you had any lab tests, please let us  know if you have not heard back within a few days. You may see your results on mychart before we have a chance to review them but we will give you a call once they are reviewed by us .   If we ordered any referrals today, please let us  know if you have not heard from their office within the next week.   If you had any urgent prescriptions sent in today, please check with the pharmacy within an hour of our visit to make sure the prescription was transmitted appropriately.   Please try these tips to maintain a healthy lifestyle:  Eat at least 3 REAL meals and 1-2 snacks per day.  Aim for no more than 5 hours between eating.  If you eat breakfast, please do so within one hour of getting up.   Each meal should contain half fruits/vegetables, one quarter protein, and one quarter carbs (no bigger than a computer mouse)  Cut down on  sweet beverages. This includes juice, soda, and sweet tea.   Drink at least 1 glass of water  with each meal and aim for at least 8 glasses per day  Exercise at least 150 minutes every week.     Preventive Care 93-66 Years Old, Female Preventive care refers to lifestyle choices and visits with your health care provider that can promote health and wellness. Preventive care visits are also called wellness exams. What can I expect for my preventive care visit? Counseling Your health care provider may ask you questions about your: Medical history, including: Past medical problems. Family medical history. Pregnancy history. Current health, including: Menstrual cycle. Method of birth control. Emotional well-being. Home life and relationship well-being. Sexual activity and sexual health. Lifestyle, including: Alcohol, nicotine or tobacco, and drug use. Access to firearms. Diet, exercise, and sleep habits. Work and work Astronomer. Sunscreen use. Safety issues such as seatbelt and bike helmet use. Physical exam Your health care provider will check your: Height and weight. These may be used to calculate your BMI (body mass index). BMI is a measurement that tells if you are at a healthy weight. Waist circumference. This measures the distance around your waistline. This measurement also tells if you are at a healthy weight and may help predict your risk of certain diseases, such as type 2 diabetes and high blood pressure. Heart rate and blood pressure. Body temperature. Skin for abnormal spots. What immunizations do I need?  Vaccines  are usually given at various ages, according to a schedule. Your health care provider will recommend vaccines for you based on your age, medical history, and lifestyle or other factors, such as travel or where you work. What tests do I need? Screening Your health care provider may recommend screening tests for certain conditions. This may include: Lipid  and cholesterol levels. Diabetes screening. This is done by checking your blood sugar (glucose) after you have not eaten for a while (fasting). Pelvic exam and Pap test. Hepatitis B test. Hepatitis C test. HIV (human immunodeficiency virus) test. STI (sexually transmitted infection) testing, if you are at risk. Lung cancer screening. Colorectal cancer screening. Mammogram. Talk with your health care provider about when you should start having regular mammograms. This may depend on whether you have a family history of breast cancer. BRCA-related cancer screening. This may be done if you have a family history of breast, ovarian, tubal, or peritoneal cancers. Bone density scan. This is done to screen for osteoporosis. Talk with your health care provider about your test results, treatment options, and if necessary, the need for more tests. Follow these instructions at home: Eating and drinking  Eat a diet that includes fresh fruits and vegetables, whole grains, lean protein, and low-fat dairy products. Take vitamin and mineral supplements as recommended by your health care provider. Do not drink alcohol if: Your health care provider tells you not to drink. You are pregnant, may be pregnant, or are planning to become pregnant. If you drink alcohol: Limit how much you have to 0-1 drink a day. Know how much alcohol is in your drink. In the [place] one drink equals one 12 oz bottle of beer (355 mL), one 5 oz glass of wine (148 mL), or one 1 oz glass of hard liquor (44 mL). Lifestyle Brush your teeth every morning and night with fluoride toothpaste. Floss one time each day. Exercise for at least 30 minutes 5 or more days each week. Do not use any products that contain nicotine or tobacco. These products include cigarettes, chewing tobacco, and vaping devices, such as e-cigarettes. If you need help quitting, ask your health care provider. Do not use drugs. If you are sexually active, practice safe  sex. Use a condom or other form of protection to prevent STIs. If you do not wish to become pregnant, use a form of birth control. If you plan to become pregnant, see your health care provider for a prepregnancy visit. Take aspirin only as told by your health care provider. Make sure that you understand how much to take and what form to take. Work with your health care provider to find out whether it is safe and beneficial for you to take aspirin daily. Find healthy ways to manage stress, such as: Meditation, yoga, or listening to music. Journaling. Talking to a trusted person. Spending time with friends and family. Minimize exposure to UV radiation to reduce your risk of skin cancer. Safety Always wear your seat belt while driving or riding in a vehicle. Do not drive: If you have been drinking alcohol. Do not ride with someone who has been drinking. When you are tired or distracted. While texting. If you have been using any mind-altering substances or drugs. Wear a helmet and other protective equipment during sports activities. If you have firearms in your house, make sure you follow all gun safety procedures. Seek help if you have been physically or sexually abused. What's next? Visit your health care provider once a year  for an annual wellness visit. Ask your health care provider how often you should have your eyes and teeth checked. Stay up to date on all vaccines. This information is not intended to replace advice given to you by your health care provider. Make sure you discuss any questions you have with your health care provider. Document Revised: 08/03/2020 Document Reviewed: 08/03/2020 Elsevier Patient Education  2024 [employer].

## 2023-12-06 NOTE — Assessment & Plan Note (Signed)
 Patient down 1 pound since last time she was here.  BMI 50.58 with comorbidities.  She would like to discuss additional options to help her with weight loss.  She is on Mounjaro  15 mg weekly for her diabetes and tolerating this well.  We discussed differential options.  She has been on phentermine in the past and has done well with this.  Will start 50 mg daily with the option to increase to 30 mg daily after couple weeks if she does well with this.  She is aware potential side effects.  She will follow-up with us  in a few weeks via MyChart.  Follow-up office visit in 3 months.

## 2023-12-06 NOTE — Assessment & Plan Note (Signed)
 Check A1c with labs.  Last A1c 6.3.  She is now on Mounjaro  15 mg weekly and tolerating well though is not had as much weight loss that she would like.  We discussed lifestyle interventions.

## 2023-12-10 ENCOUNTER — Other Ambulatory Visit (INDEPENDENT_AMBULATORY_CARE_PROVIDER_SITE_OTHER)

## 2023-12-10 ENCOUNTER — Other Ambulatory Visit (HOSPITAL_COMMUNITY): Payer: Self-pay

## 2023-12-10 ENCOUNTER — Other Ambulatory Visit: Payer: Self-pay

## 2023-12-10 DIAGNOSIS — E611 Iron deficiency: Secondary | ICD-10-CM

## 2023-12-10 DIAGNOSIS — Z0000 Encounter for general adult medical examination without abnormal findings: Secondary | ICD-10-CM

## 2023-12-10 DIAGNOSIS — E118 Type 2 diabetes mellitus with unspecified complications: Secondary | ICD-10-CM | POA: Diagnosis not present

## 2023-12-10 DIAGNOSIS — Z13220 Encounter for screening for lipoid disorders: Secondary | ICD-10-CM | POA: Diagnosis not present

## 2023-12-10 DIAGNOSIS — Z0001 Encounter for general adult medical examination with abnormal findings: Secondary | ICD-10-CM

## 2023-12-10 LAB — LIPID PANEL
Cholesterol: 146 mg/dL (ref 0–200)
HDL: 36.9 mg/dL — ABNORMAL LOW (ref >39.00)
LDL Cholesterol: 97 mg/dL (ref 0–99)
NonHDL: 109.09
Total CHOL/HDL Ratio: 4
Triglycerides: 61 mg/dL (ref 0.0–149.0)
VLDL: 12.2 mg/dL (ref 0.0–40.0)

## 2023-12-10 LAB — CBC
HCT: 35.7 % — ABNORMAL LOW (ref 36.0–46.0)
Hemoglobin: 11 g/dL — ABNORMAL LOW (ref 12.0–15.0)
MCHC: 30.9 g/dL (ref 30.0–36.0)
MCV: 68.6 fl — ABNORMAL LOW (ref 78.0–100.0)
Platelets: 323 K/uL (ref 150.0–400.0)
RBC: 5.2 Mil/uL — ABNORMAL HIGH (ref 3.87–5.11)
RDW: 17.5 % — ABNORMAL HIGH (ref 11.5–15.5)
WBC: 6.3 K/uL (ref 4.0–10.5)

## 2023-12-10 LAB — IBC + FERRITIN
Ferritin: 31.5 ng/mL (ref 10.0–291.0)
Iron: 30 ug/dL — ABNORMAL LOW (ref 42–145)
Saturation Ratios: 8.9 % — ABNORMAL LOW (ref 20.0–50.0)
TIBC: 338.8 ug/dL (ref 250.0–450.0)
Transferrin: 242 mg/dL (ref 212.0–360.0)

## 2023-12-10 LAB — COMPREHENSIVE METABOLIC PANEL WITH GFR
ALT: 10 U/L (ref 0–35)
AST: 12 U/L (ref 0–37)
Albumin: 4 g/dL (ref 3.5–5.2)
Alkaline Phosphatase: 77 U/L (ref 39–117)
BUN: 7 mg/dL (ref 6–23)
CO2: 24 meq/L (ref 19–32)
Calcium: 8.5 mg/dL (ref 8.4–10.5)
Chloride: 104 meq/L (ref 96–112)
Creatinine, Ser: 0.71 mg/dL (ref 0.40–1.20)
GFR: 102.19 mL/min (ref >60.00)
Glucose, Bld: 119 mg/dL — ABNORMAL HIGH (ref 70–99)
Potassium: 3.4 meq/L — ABNORMAL LOW (ref 3.5–5.1)
Sodium: 138 meq/L (ref 135–145)
Total Bilirubin: 0.8 mg/dL (ref 0.2–1.2)
Total Protein: 6.9 g/dL (ref 6.0–8.3)

## 2023-12-10 LAB — TSH: TSH: 1.1 u[IU]/mL (ref 0.35–5.50)

## 2023-12-10 LAB — HEMOGLOBIN A1C: Hgb A1c MFr Bld: 6.8 % — ABNORMAL HIGH (ref 4.6–6.5)

## 2023-12-10 LAB — VITAMIN B12: Vitamin B-12: 274 pg/mL (ref 211–911)

## 2023-12-10 LAB — VITAMIN D 25 HYDROXY (VIT D DEFICIENCY, FRACTURES): VITD: 10.45 ng/mL — ABNORMAL LOW (ref 30.00–100.00)

## 2023-12-10 MED ORDER — NITROFURANTOIN MONOHYD MACRO 100 MG PO CAPS
100.0000 mg | ORAL_CAPSULE | Freq: Two times a day (BID) | ORAL | 0 refills | Status: DC
  Filled 2023-12-10: qty 14, 7d supply, fill #0

## 2023-12-11 ENCOUNTER — Ambulatory Visit: Payer: Self-pay | Admitting: Family Medicine

## 2023-12-11 DIAGNOSIS — E559 Vitamin D deficiency, unspecified: Secondary | ICD-10-CM | POA: Insufficient documentation

## 2023-12-11 NOTE — Progress Notes (Signed)
 Her potassium is little bit low but this is a similar to her previous values over the last year or so.  Do not need to treat this specifically however it is important that she try to get potassium rich foods such as fruits and vegetables and we can recheck this again at her next visit here.  Her hemoglobin levels are below normal range but are improving compared to last check.  We can recheck this again in 6 to 12 months  Vitamin D  is low.  Recommend starting a vitamin D  supplementation 50,000 IUs once weekly.  Please send in prescription for this.  We should recheck vitamin D  level in 3 to 6 months.  Her cholesterol levels are better than last time we checked.  We can recheck this again in a year.  A1c is up slightly at 6.8 though this is still at goal.  I hope this will improve with weight loss and lifestyle interventions.  We can recheck in 3 to 6 months.  All of her other labs are at goal and we can recheck in a year.

## 2023-12-17 ENCOUNTER — Other Ambulatory Visit (HOSPITAL_COMMUNITY): Payer: Self-pay

## 2023-12-17 MED ORDER — VITAMIN D (ERGOCALCIFEROL) 1.25 MG (50000 UNIT) PO CAPS
50000.0000 [IU] | ORAL_CAPSULE | ORAL | 0 refills | Status: AC
  Filled 2023-12-17: qty 12, 84d supply, fill #0

## 2023-12-18 ENCOUNTER — Other Ambulatory Visit: Payer: Self-pay

## 2023-12-18 ENCOUNTER — Other Ambulatory Visit (HOSPITAL_COMMUNITY): Payer: Self-pay

## 2023-12-18 ENCOUNTER — Other Ambulatory Visit: Payer: Self-pay | Admitting: Family Medicine

## 2023-12-18 MED ORDER — MOUNJARO 15 MG/0.5ML ~~LOC~~ SOAJ
15.0000 mg | SUBCUTANEOUS | 0 refills | Status: DC
  Filled 2023-12-18: qty 2, 28d supply, fill #0

## 2023-12-23 ENCOUNTER — Encounter: Payer: Self-pay | Admitting: Family Medicine

## 2023-12-23 NOTE — Telephone Encounter (Signed)
 See note

## 2023-12-24 NOTE — Telephone Encounter (Signed)
 I appreciate the update. Will see her again in a few weeks.

## 2024-01-08 ENCOUNTER — Other Ambulatory Visit (HOSPITAL_COMMUNITY): Payer: Self-pay

## 2024-01-08 ENCOUNTER — Other Ambulatory Visit: Payer: Self-pay

## 2024-01-20 ENCOUNTER — Other Ambulatory Visit: Payer: Self-pay | Admitting: Family Medicine

## 2024-01-20 ENCOUNTER — Other Ambulatory Visit: Payer: Self-pay

## 2024-01-20 ENCOUNTER — Other Ambulatory Visit (HOSPITAL_COMMUNITY): Payer: Self-pay

## 2024-01-20 MED ORDER — MOUNJARO 15 MG/0.5ML ~~LOC~~ SOAJ
15.0000 mg | SUBCUTANEOUS | 0 refills | Status: DC
  Filled 2024-01-20: qty 2, 28d supply, fill #0

## 2024-02-03 ENCOUNTER — Other Ambulatory Visit: Payer: Self-pay | Admitting: Family Medicine

## 2024-02-04 ENCOUNTER — Other Ambulatory Visit (HOSPITAL_COMMUNITY): Payer: Self-pay

## 2024-02-04 ENCOUNTER — Telehealth (HOSPITAL_COMMUNITY): Payer: Self-pay

## 2024-02-04 NOTE — Telephone Encounter (Signed)
 Pharmacy Patient Advocate Encounter   Received notification from Patient Pharmacy that prior authorization for Phentermine  HCl 15MG  capsules  is required/requested.   Insurance verification completed.   The patient is insured through [facility]   Per test claim: PA required; PA submitted to above mentioned insurance via Latent Key/confirmation #/[id number] Status is pending

## 2024-02-07 ENCOUNTER — Other Ambulatory Visit (HOSPITAL_COMMUNITY): Payer: Self-pay

## 2024-02-07 ENCOUNTER — Other Ambulatory Visit: Payer: Self-pay

## 2024-02-07 NOTE — Telephone Encounter (Signed)
 Pharmacy Patient Advocate Encounter  Received notification from [facility] that Prior Authorization for Phentermine  HCl 15MG  capsules  has been DENIED.  See denial reason below. No denial letter attached in CMM. Will attach denial letter to Media tab once received.   PA #/Case ID/Reference #: [id number]      *copay would be $11.70 at [facility]

## 2024-02-10 ENCOUNTER — Other Ambulatory Visit (HOSPITAL_COMMUNITY): Payer: Self-pay

## 2024-02-12 ENCOUNTER — Other Ambulatory Visit (HOSPITAL_COMMUNITY): Payer: Self-pay

## 2024-02-12 ENCOUNTER — Other Ambulatory Visit: Payer: Self-pay

## 2024-02-12 ENCOUNTER — Other Ambulatory Visit: Payer: Self-pay | Admitting: Family Medicine

## 2024-02-12 MED ORDER — MOUNJARO 15 MG/0.5ML ~~LOC~~ SOAJ
15.0000 mg | SUBCUTANEOUS | 0 refills | Status: DC
  Filled 2024-02-12: qty 2, 28d supply, fill #0

## 2024-03-10 ENCOUNTER — Other Ambulatory Visit: Payer: Self-pay | Admitting: Family Medicine

## 2024-03-10 ENCOUNTER — Other Ambulatory Visit (HOSPITAL_COMMUNITY): Payer: Self-pay

## 2024-03-10 MED ORDER — MOUNJARO 15 MG/0.5ML ~~LOC~~ SOAJ
15.0000 mg | SUBCUTANEOUS | 0 refills | Status: AC
  Filled 2024-03-10 – 2024-03-11 (×2): qty 2, 28d supply, fill #0

## 2024-03-11 ENCOUNTER — Other Ambulatory Visit (HOSPITAL_COMMUNITY): Payer: Self-pay

## 2024-03-27 ENCOUNTER — Ambulatory Visit: Admitting: Family Medicine

## 2024-03-27 ENCOUNTER — Other Ambulatory Visit (HOSPITAL_COMMUNITY): Payer: Self-pay

## 2024-03-27 ENCOUNTER — Encounter: Payer: Self-pay | Admitting: Family Medicine

## 2024-03-27 ENCOUNTER — Telehealth (HOSPITAL_COMMUNITY): Payer: Self-pay

## 2024-03-27 VITALS — BP 128/88 | HR 89 | Temp 97.2°F | Ht 60.0 in | Wt 234.0 lb

## 2024-03-27 DIAGNOSIS — E118 Type 2 diabetes mellitus with unspecified complications: Secondary | ICD-10-CM

## 2024-03-27 DIAGNOSIS — E559 Vitamin D deficiency, unspecified: Secondary | ICD-10-CM

## 2024-03-27 DIAGNOSIS — N760 Acute vaginitis: Secondary | ICD-10-CM

## 2024-03-27 DIAGNOSIS — N390 Urinary tract infection, site not specified: Secondary | ICD-10-CM

## 2024-03-27 LAB — MICROALBUMIN / CREATININE URINE RATIO
Creatinine,U: 269.8 mg/dL
Microalb Creat Ratio: 5.1 mg/g (ref 0.0–30.0)
Microalb, Ur: 1.4 mg/dL (ref 0.7–1.9)

## 2024-03-27 LAB — URINALYSIS, ROUTINE W REFLEX MICROSCOPIC
Bilirubin Urine: NEGATIVE
Hgb urine dipstick: NEGATIVE
Leukocytes,Ua: NEGATIVE
Nitrite: NEGATIVE
Specific Gravity, Urine: 1.015 (ref 1.000–1.030)
Total Protein, Urine: NEGATIVE
Urine Glucose: NEGATIVE
Urobilinogen, UA: 1 (ref 0.0–1.0)
pH: 6.5 (ref 5.0–8.0)

## 2024-03-27 LAB — POCT GLYCOSYLATED HEMOGLOBIN (HGB A1C): Hemoglobin A1C: 5.9 % — AB (ref 4.0–5.6)

## 2024-03-27 LAB — VITAMIN D 25 HYDROXY (VIT D DEFICIENCY, FRACTURES): VITD: 28.2 ng/mL — ABNORMAL LOW (ref 30.00–100.00)

## 2024-03-27 MED ORDER — METRONIDAZOLE 0.75 % VA GEL
1.0000 | Freq: Every day | VAGINAL | 0 refills | Status: AC
  Filled 2024-03-27: qty 70, 7d supply, fill #0

## 2024-03-27 MED ORDER — CYCLOBENZAPRINE HCL 10 MG PO TABS
10.0000 mg | ORAL_TABLET | Freq: Three times a day (TID) | ORAL | 0 refills | Status: AC | PRN
  Filled 2024-03-27: qty 30, 10d supply, fill #0

## 2024-03-27 MED ORDER — DEXCOM G6 SENSOR MISC
5 refills | Status: AC
  Filled 2024-03-27: qty 3, 30d supply, fill #0

## 2024-03-27 MED ORDER — MELOXICAM 15 MG PO TABS
15.0000 mg | ORAL_TABLET | Freq: Every day | ORAL | 0 refills | Status: AC
  Filled 2024-03-27: qty 30, 30d supply, fill #0
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
## 2024-03-27 NOTE — Assessment & Plan Note (Signed)
 She is about 25 pounds since her last visit.  She is doing well with Mounjaro  15 mg weekly.  We also did recently start phentermine .  She is taking 15 mg daily which is helping significantly with appetite control.  She does folic it is helping her significantly with weight loss as well.  Will continue her current regimen for now.  Follow-up with me in 6 months.

## 2024-03-27 NOTE — Telephone Encounter (Signed)
 Pharmacy Patient Advocate Encounter  Received notification from [facility] that Prior Authorization for Dexcom G6 Sensor  has been APPROVED from 03/27/24 to 03/26/25. Ran test claim, Copay is $71.99. This test claim was processed through [facility]- copay amounts may vary at other pharmacies due to pharmacy/plan contracts, or as the patient moves through the different stages of their insurance plan.   PA #/Case ID/Reference #: [id number]

## 2024-03-27 NOTE — Assessment & Plan Note (Signed)
 She completed her course of vitamin D  50,000 IUs weekly.  Recheck vitamin D .  Depending on results she can likely start with over-the-counter maintenance therapy 2000 to 5000 IUs daily.

## 2024-03-27 NOTE — Telephone Encounter (Signed)
 Pharmacy Patient Advocate Encounter   Received notification from [facility] that prior authorization for Dexcom G6 Sensor  is required/requested.   Insurance verification completed.   The patient is insured through [facility].   Per test claim: PA required; PA submitted to above mentioned insurance via Latent Key/confirmation #/EOC [facility] Status is pending

## 2024-03-27 NOTE — Assessment & Plan Note (Signed)
 A1c very well-controlled today at 5.9.  She is on Mounjaro  15 mg weekly and tolerating well.

## 2024-12-11 ENCOUNTER — Encounter: Admitting: Family Medicine
